# Patient Record
Sex: Female | Born: 1940 | Race: White | Hispanic: No | Marital: Married | Smoking: Never smoker
Health system: Southern US, Community
[De-identification: ages and names within clinical notes are randomized; demographics above are authoritative.]

## PROBLEM LIST (undated history)

## (undated) DIAGNOSIS — J302 Other seasonal allergic rhinitis: Secondary | ICD-10-CM

## (undated) DIAGNOSIS — T7840XA Allergy, unspecified, initial encounter: Secondary | ICD-10-CM

## (undated) DIAGNOSIS — I639 Cerebral infarction, unspecified: Secondary | ICD-10-CM

## (undated) DIAGNOSIS — H269 Unspecified cataract: Secondary | ICD-10-CM

## (undated) DIAGNOSIS — E785 Hyperlipidemia, unspecified: Secondary | ICD-10-CM

## (undated) DIAGNOSIS — M2550 Pain in unspecified joint: Secondary | ICD-10-CM

## (undated) DIAGNOSIS — H409 Unspecified glaucoma: Secondary | ICD-10-CM

## (undated) DIAGNOSIS — Z9289 Personal history of other medical treatment: Secondary | ICD-10-CM

## (undated) DIAGNOSIS — R011 Cardiac murmur, unspecified: Secondary | ICD-10-CM

## (undated) DIAGNOSIS — M1990 Unspecified osteoarthritis, unspecified site: Secondary | ICD-10-CM

## (undated) DIAGNOSIS — C50919 Malignant neoplasm of unspecified site of unspecified female breast: Secondary | ICD-10-CM

## (undated) DIAGNOSIS — M810 Age-related osteoporosis without current pathological fracture: Secondary | ICD-10-CM

## (undated) DIAGNOSIS — K219 Gastro-esophageal reflux disease without esophagitis: Secondary | ICD-10-CM

## (undated) DIAGNOSIS — H348192 Central retinal vein occlusion, unspecified eye, stable: Secondary | ICD-10-CM

## (undated) DIAGNOSIS — I10 Essential (primary) hypertension: Secondary | ICD-10-CM

## (undated) HISTORY — DX: Gastro-esophageal reflux disease without esophagitis: K21.9

## (undated) HISTORY — DX: Unspecified osteoarthritis, unspecified site: M19.90

## (undated) HISTORY — DX: Unspecified cataract: H26.9

## (undated) HISTORY — PX: ARTHROSCOPY KNEE W/ DRILLING: SUR92

## (undated) HISTORY — DX: Age-related osteoporosis without current pathological fracture: M81.0

## (undated) HISTORY — DX: Malignant neoplasm of unspecified site of unspecified female breast: C50.919

## (undated) HISTORY — DX: Hyperlipidemia, unspecified: E78.5

## (undated) HISTORY — DX: Personal history of other medical treatment: Z92.89

## (undated) HISTORY — DX: Pain in unspecified joint: M25.50

## (undated) HISTORY — DX: Unspecified glaucoma: H40.9

## (undated) HISTORY — PX: EXCISION / BIOPSY BREAST / NIPPLE / DUCT: SUR469

## (undated) HISTORY — DX: Allergy, unspecified, initial encounter: T78.40XA

## (undated) HISTORY — DX: Essential (primary) hypertension: I10

## (undated) HISTORY — DX: Central retinal vein occlusion, unspecified eye, stable: H34.8192

## (undated) HISTORY — DX: Cardiac murmur, unspecified: R01.1

## (undated) HISTORY — DX: Cerebral infarction, unspecified: I63.9

---

## 1981-11-17 HISTORY — PX: TUBAL LIGATION: SHX77

## 1989-11-17 HISTORY — PX: ABDOMINAL HYSTERECTOMY: SHX81

## 2001-11-17 HISTORY — PX: BREAST LUMPECTOMY: SHX2

## 2005-05-21 ENCOUNTER — Other Ambulatory Visit: Admission: RE | Admit: 2005-05-21 | Discharge: 2005-05-21 | Payer: Self-pay | Admitting: Gynecology

## 2005-05-23 ENCOUNTER — Encounter: Admission: RE | Admit: 2005-05-23 | Discharge: 2005-05-23 | Payer: Self-pay | Admitting: Gynecology

## 2005-12-19 ENCOUNTER — Ambulatory Visit: Payer: Self-pay | Admitting: Internal Medicine

## 2005-12-24 ENCOUNTER — Ambulatory Visit: Payer: Self-pay | Admitting: Internal Medicine

## 2006-01-22 ENCOUNTER — Ambulatory Visit: Payer: Self-pay | Admitting: Internal Medicine

## 2006-03-30 ENCOUNTER — Ambulatory Visit: Payer: Self-pay | Admitting: Internal Medicine

## 2006-05-25 ENCOUNTER — Encounter: Admission: RE | Admit: 2006-05-25 | Discharge: 2006-05-25 | Payer: Self-pay | Admitting: Internal Medicine

## 2006-06-01 ENCOUNTER — Other Ambulatory Visit: Admission: RE | Admit: 2006-06-01 | Discharge: 2006-06-01 | Payer: Self-pay | Admitting: Gynecology

## 2006-07-31 ENCOUNTER — Ambulatory Visit: Payer: Self-pay | Admitting: Gastroenterology

## 2006-08-06 ENCOUNTER — Ambulatory Visit: Payer: Self-pay | Admitting: Gastroenterology

## 2006-08-19 ENCOUNTER — Ambulatory Visit: Payer: Self-pay | Admitting: Gastroenterology

## 2006-09-17 ENCOUNTER — Ambulatory Visit: Payer: Self-pay | Admitting: Internal Medicine

## 2006-09-22 ENCOUNTER — Encounter: Admission: RE | Admit: 2006-09-22 | Discharge: 2006-09-22 | Payer: Self-pay | Admitting: Internal Medicine

## 2007-01-25 ENCOUNTER — Ambulatory Visit: Payer: Self-pay | Admitting: Internal Medicine

## 2007-01-25 LAB — CONVERTED CEMR LAB
ALT: 25 units/L (ref 0–40)
AST: 22 units/L (ref 0–37)
Albumin: 3.9 g/dL (ref 3.5–5.2)
Alkaline Phosphatase: 85 units/L (ref 39–117)
BUN: 13 mg/dL (ref 6–23)
Basophils Absolute: 0 10*3/uL (ref 0.0–0.1)
Basophils Relative: 0.2 % (ref 0.0–1.0)
Bilirubin Urine: NEGATIVE
Bilirubin, Direct: 0.1 mg/dL (ref 0.0–0.3)
CO2: 34 meq/L — ABNORMAL HIGH (ref 19–32)
Calcium: 9.9 mg/dL (ref 8.4–10.5)
Chloride: 103 meq/L (ref 96–112)
Cholesterol: 140 mg/dL (ref 0–200)
Creatinine, Ser: 0.6 mg/dL (ref 0.4–1.2)
Eosinophils Absolute: 0.1 10*3/uL (ref 0.0–0.6)
Eosinophils Relative: 1.8 % (ref 0.0–5.0)
GFR calc Af Amer: 129 mL/min
GFR calc non Af Amer: 106 mL/min
Glucose, Bld: 99 mg/dL (ref 70–99)
HCT: 41 % (ref 36.0–46.0)
HDL: 37.7 mg/dL — ABNORMAL LOW (ref >39.0)
Hemoglobin, Urine: NEGATIVE
Hemoglobin: 14.3 g/dL (ref 12.0–15.0)
Ketones, ur: NEGATIVE mg/dL
LDL Cholesterol: 66 mg/dL (ref 0–99)
Leukocytes, UA: NEGATIVE
Lymphocytes Relative: 27.5 % (ref 12.0–46.0)
MCHC: 34.9 g/dL (ref 30.0–36.0)
MCV: 92.4 fL (ref 78.0–100.0)
Monocytes Absolute: 0.4 10*3/uL (ref 0.2–0.7)
Monocytes Relative: 5.3 % (ref 3.0–11.0)
Neutro Abs: 5.4 10*3/uL (ref 1.4–7.7)
Neutrophils Relative %: 65.2 % (ref 43.0–77.0)
Nitrite: NEGATIVE
Platelets: 296 10*3/uL (ref 150–400)
Potassium: 4.3 meq/L (ref 3.5–5.1)
RBC: 4.43 M/uL (ref 3.87–5.11)
RDW: 11.6 % (ref 11.5–14.6)
Sodium: 144 meq/L (ref 135–145)
Specific Gravity, Urine: 1.02 (ref 1.000–1.03)
TSH: 2.24 microintl units/mL (ref 0.35–5.50)
Total Bilirubin: 0.8 mg/dL (ref 0.3–1.2)
Total CHOL/HDL Ratio: 3.7
Total Protein, Urine: NEGATIVE mg/dL
Total Protein: 7 g/dL (ref 6.0–8.3)
Triglycerides: 181 mg/dL — ABNORMAL HIGH (ref 0–149)
Urine Glucose: NEGATIVE mg/dL
Urobilinogen, UA: 0.2 (ref 0.0–1.0)
VLDL: 36 mg/dL (ref 0–40)
WBC: 8.1 10*3/uL (ref 4.5–10.5)
pH: 7 (ref 5.0–8.0)

## 2007-03-09 ENCOUNTER — Ambulatory Visit: Payer: Self-pay | Admitting: Vascular Surgery

## 2007-03-09 ENCOUNTER — Encounter: Payer: Self-pay | Admitting: Vascular Surgery

## 2007-03-09 ENCOUNTER — Ambulatory Visit (HOSPITAL_COMMUNITY): Admission: RE | Admit: 2007-03-09 | Discharge: 2007-03-09 | Payer: Self-pay | Admitting: Orthopedic Surgery

## 2007-05-28 ENCOUNTER — Encounter: Admission: RE | Admit: 2007-05-28 | Discharge: 2007-05-28 | Payer: Self-pay | Admitting: Family Medicine

## 2007-07-07 ENCOUNTER — Encounter: Admission: RE | Admit: 2007-07-07 | Discharge: 2007-07-07 | Payer: Self-pay | Admitting: Family Medicine

## 2007-07-10 ENCOUNTER — Encounter: Payer: Self-pay | Admitting: *Deleted

## 2007-07-10 DIAGNOSIS — Z9889 Other specified postprocedural states: Secondary | ICD-10-CM | POA: Insufficient documentation

## 2007-07-10 DIAGNOSIS — Z8679 Personal history of other diseases of the circulatory system: Secondary | ICD-10-CM | POA: Insufficient documentation

## 2007-07-10 DIAGNOSIS — E785 Hyperlipidemia, unspecified: Secondary | ICD-10-CM | POA: Insufficient documentation

## 2007-07-10 DIAGNOSIS — Z853 Personal history of malignant neoplasm of breast: Secondary | ICD-10-CM | POA: Insufficient documentation

## 2007-09-14 ENCOUNTER — Emergency Department (HOSPITAL_COMMUNITY): Admission: EM | Admit: 2007-09-14 | Discharge: 2007-09-14 | Payer: Self-pay | Admitting: Emergency Medicine

## 2008-07-04 ENCOUNTER — Encounter: Admission: RE | Admit: 2008-07-04 | Discharge: 2008-07-04 | Payer: Self-pay | Admitting: Family Medicine

## 2009-07-05 ENCOUNTER — Encounter: Admission: RE | Admit: 2009-07-05 | Discharge: 2009-07-05 | Payer: Self-pay | Admitting: Family Medicine

## 2009-07-09 ENCOUNTER — Encounter: Admission: RE | Admit: 2009-07-09 | Discharge: 2009-07-09 | Payer: Self-pay | Admitting: Family Medicine

## 2009-09-04 ENCOUNTER — Encounter: Admission: RE | Admit: 2009-09-04 | Discharge: 2009-09-04 | Payer: Self-pay | Admitting: Family Medicine

## 2009-09-27 DIAGNOSIS — Z9289 Personal history of other medical treatment: Secondary | ICD-10-CM

## 2009-09-27 HISTORY — DX: Personal history of other medical treatment: Z92.89

## 2010-07-11 ENCOUNTER — Encounter: Admission: RE | Admit: 2010-07-11 | Discharge: 2010-07-11 | Payer: Self-pay | Admitting: Family Medicine

## 2011-06-16 ENCOUNTER — Other Ambulatory Visit: Payer: Self-pay | Admitting: Family Medicine

## 2011-06-16 DIAGNOSIS — N6320 Unspecified lump in the left breast, unspecified quadrant: Secondary | ICD-10-CM

## 2011-06-23 ENCOUNTER — Other Ambulatory Visit: Payer: Self-pay

## 2011-06-24 ENCOUNTER — Ambulatory Visit
Admission: RE | Admit: 2011-06-24 | Discharge: 2011-06-24 | Disposition: A | Discharge status: Home / Self Care | Payer: Medicare Other | Source: Ambulatory Visit | Attending: Family Medicine | Admitting: Family Medicine

## 2011-06-24 ENCOUNTER — Other Ambulatory Visit: Payer: Self-pay | Admitting: Family Medicine

## 2011-06-24 DIAGNOSIS — N6320 Unspecified lump in the left breast, unspecified quadrant: Secondary | ICD-10-CM

## 2011-06-24 DIAGNOSIS — Z1231 Encounter for screening mammogram for malignant neoplasm of breast: Secondary | ICD-10-CM

## 2011-07-25 ENCOUNTER — Ambulatory Visit
Admission: RE | Admit: 2011-07-25 | Discharge: 2011-07-25 | Disposition: A | Discharge status: Home / Self Care | Payer: Medicare Other | Source: Ambulatory Visit | Attending: Family Medicine | Admitting: Family Medicine

## 2011-07-25 DIAGNOSIS — Z1231 Encounter for screening mammogram for malignant neoplasm of breast: Secondary | ICD-10-CM

## 2012-03-25 ENCOUNTER — Other Ambulatory Visit: Payer: Self-pay | Admitting: Family Medicine

## 2012-03-25 DIAGNOSIS — Z780 Asymptomatic menopausal state: Secondary | ICD-10-CM

## 2012-04-02 ENCOUNTER — Ambulatory Visit
Admission: RE | Admit: 2012-04-02 | Discharge: 2012-04-02 | Disposition: A | Discharge status: Home / Self Care | Payer: Medicare Other | Source: Ambulatory Visit | Attending: Family Medicine | Admitting: Family Medicine

## 2012-04-02 DIAGNOSIS — Z780 Asymptomatic menopausal state: Secondary | ICD-10-CM

## 2012-07-26 ENCOUNTER — Other Ambulatory Visit: Payer: Self-pay | Admitting: Family Medicine

## 2012-07-26 DIAGNOSIS — Z1231 Encounter for screening mammogram for malignant neoplasm of breast: Secondary | ICD-10-CM

## 2012-08-12 ENCOUNTER — Ambulatory Visit: Payer: Medicare Other

## 2012-08-23 ENCOUNTER — Ambulatory Visit
Admission: RE | Admit: 2012-08-23 | Discharge: 2012-08-23 | Disposition: A | Discharge status: Home / Self Care | Payer: Medicare Other | Source: Ambulatory Visit | Attending: Family Medicine | Admitting: Family Medicine

## 2012-08-23 DIAGNOSIS — Z1231 Encounter for screening mammogram for malignant neoplasm of breast: Secondary | ICD-10-CM

## 2012-08-25 ENCOUNTER — Other Ambulatory Visit: Payer: Self-pay | Admitting: Family Medicine

## 2012-08-25 DIAGNOSIS — R928 Other abnormal and inconclusive findings on diagnostic imaging of breast: Secondary | ICD-10-CM

## 2012-08-31 ENCOUNTER — Ambulatory Visit
Admission: RE | Admit: 2012-08-31 | Discharge: 2012-08-31 | Disposition: A | Discharge status: Home / Self Care | Payer: Medicare Other | Source: Ambulatory Visit | Attending: Family Medicine | Admitting: Family Medicine

## 2012-08-31 ENCOUNTER — Other Ambulatory Visit: Payer: Self-pay | Admitting: Family Medicine

## 2012-08-31 DIAGNOSIS — R928 Other abnormal and inconclusive findings on diagnostic imaging of breast: Secondary | ICD-10-CM

## 2012-09-01 ENCOUNTER — Other Ambulatory Visit: Payer: Self-pay | Admitting: Family Medicine

## 2012-09-01 DIAGNOSIS — C50911 Malignant neoplasm of unspecified site of right female breast: Secondary | ICD-10-CM

## 2012-09-03 ENCOUNTER — Other Ambulatory Visit: Payer: Self-pay | Admitting: Oncology

## 2012-09-03 ENCOUNTER — Encounter: Payer: Self-pay | Admitting: Oncology

## 2012-09-03 DIAGNOSIS — C50411 Malignant neoplasm of upper-outer quadrant of right female breast: Secondary | ICD-10-CM | POA: Insufficient documentation

## 2012-09-03 DIAGNOSIS — C50419 Malignant neoplasm of upper-outer quadrant of unspecified female breast: Secondary | ICD-10-CM

## 2012-09-06 ENCOUNTER — Ambulatory Visit
Admission: RE | Admit: 2012-09-06 | Discharge: 2012-09-06 | Disposition: A | Discharge status: Home / Self Care | Payer: Medicare Other | Source: Ambulatory Visit | Attending: Family Medicine | Admitting: Family Medicine

## 2012-09-06 DIAGNOSIS — C50911 Malignant neoplasm of unspecified site of right female breast: Secondary | ICD-10-CM

## 2012-09-06 MED ORDER — GADOBENATE DIMEGLUMINE 529 MG/ML IV SOLN
12.0000 mL | Freq: Once | INTRAVENOUS | Status: AC | PRN
  Administered 2012-09-06: 12 mL via INTRAVENOUS

## 2012-09-08 ENCOUNTER — Ambulatory Visit (HOSPITAL_BASED_OUTPATIENT_CLINIC_OR_DEPARTMENT_OTHER): Payer: Medicare Other | Admitting: Lab

## 2012-09-08 ENCOUNTER — Encounter: Payer: Self-pay | Admitting: *Deleted

## 2012-09-08 ENCOUNTER — Ambulatory Visit (HOSPITAL_BASED_OUTPATIENT_CLINIC_OR_DEPARTMENT_OTHER): Payer: Medicare Other | Admitting: General Surgery

## 2012-09-08 ENCOUNTER — Ambulatory Visit: Payer: Medicare Other | Attending: General Surgery | Admitting: Physical Therapy

## 2012-09-08 ENCOUNTER — Telehealth: Payer: Self-pay | Admitting: *Deleted

## 2012-09-08 ENCOUNTER — Encounter: Payer: Self-pay | Admitting: Oncology

## 2012-09-08 ENCOUNTER — Ambulatory Visit: Payer: Medicare Other

## 2012-09-08 ENCOUNTER — Ambulatory Visit (HOSPITAL_BASED_OUTPATIENT_CLINIC_OR_DEPARTMENT_OTHER): Payer: Medicare Other | Admitting: Oncology

## 2012-09-08 ENCOUNTER — Ambulatory Visit
Admission: RE | Admit: 2012-09-08 | Discharge: 2012-09-08 | Disposition: A | Discharge status: Home / Self Care | Payer: Medicare Other | Source: Ambulatory Visit | Attending: Radiation Oncology | Admitting: Radiation Oncology

## 2012-09-08 ENCOUNTER — Other Ambulatory Visit: Payer: Self-pay | Admitting: *Deleted

## 2012-09-08 ENCOUNTER — Ambulatory Visit: Payer: Self-pay | Admitting: Radiation Oncology

## 2012-09-08 ENCOUNTER — Encounter (INDEPENDENT_AMBULATORY_CARE_PROVIDER_SITE_OTHER): Payer: Self-pay | Admitting: General Surgery

## 2012-09-08 VITALS — BP 152/79 | HR 89 | Temp 97.8°F | Resp 20 | Ht 64.0 in | Wt 141.0 lb

## 2012-09-08 DIAGNOSIS — C50911 Malignant neoplasm of unspecified site of right female breast: Secondary | ICD-10-CM

## 2012-09-08 DIAGNOSIS — C50419 Malignant neoplasm of upper-outer quadrant of unspecified female breast: Secondary | ICD-10-CM

## 2012-09-08 DIAGNOSIS — C773 Secondary and unspecified malignant neoplasm of axilla and upper limb lymph nodes: Secondary | ICD-10-CM

## 2012-09-08 DIAGNOSIS — R293 Abnormal posture: Secondary | ICD-10-CM | POA: Insufficient documentation

## 2012-09-08 DIAGNOSIS — Z170 Estrogen receptor positive status [ER+]: Secondary | ICD-10-CM

## 2012-09-08 DIAGNOSIS — IMO0001 Reserved for inherently not codable concepts without codable children: Secondary | ICD-10-CM | POA: Insufficient documentation

## 2012-09-08 DIAGNOSIS — C50919 Malignant neoplasm of unspecified site of unspecified female breast: Secondary | ICD-10-CM

## 2012-09-08 DIAGNOSIS — M25619 Stiffness of unspecified shoulder, not elsewhere classified: Secondary | ICD-10-CM | POA: Insufficient documentation

## 2012-09-08 LAB — COMPREHENSIVE METABOLIC PANEL (CC13)
ALT: 22 U/L (ref 0–55)
AST: 18 U/L (ref 5–34)
Albumin: 3.8 g/dL (ref 3.5–5.0)
Alkaline Phosphatase: 79 U/L (ref 40–150)
BUN: 13 mg/dL (ref 7.0–26.0)
CO2: 25 mEq/L (ref 22–29)
Calcium: 9.7 mg/dL (ref 8.4–10.4)
Chloride: 106 mEq/L (ref 98–107)
Creatinine: 0.7 mg/dL (ref 0.6–1.1)
Glucose: 95 mg/dl (ref 70–99)
Potassium: 3.8 mEq/L (ref 3.5–5.1)
Sodium: 144 mEq/L (ref 136–145)
Total Bilirubin: 0.6 mg/dL (ref 0.20–1.20)
Total Protein: 6.4 g/dL (ref 6.4–8.3)

## 2012-09-08 LAB — CBC WITH DIFFERENTIAL/PLATELET
BASO%: 0.6 % (ref 0.0–2.0)
Basophils Absolute: 0 10*3/uL (ref 0.0–0.1)
EOS%: 1.4 % (ref 0.0–7.0)
Eosinophils Absolute: 0.1 10*3/uL (ref 0.0–0.5)
HCT: 38.6 % (ref 34.8–46.6)
HGB: 13.5 g/dL (ref 11.6–15.9)
LYMPH%: 26.1 % (ref 14.0–49.7)
MCH: 32.7 pg (ref 25.1–34.0)
MCHC: 35 g/dL (ref 31.5–36.0)
MCV: 93.4 fL (ref 79.5–101.0)
MONO#: 0.4 10*3/uL (ref 0.1–0.9)
MONO%: 6.1 % (ref 0.0–14.0)
NEUT#: 4.7 10*3/uL (ref 1.5–6.5)
NEUT%: 65.8 % (ref 38.4–76.8)
Platelets: 210 10*3/uL (ref 145–400)
RBC: 4.13 10*6/uL (ref 3.70–5.45)
RDW: 12.2 % (ref 11.2–14.5)
WBC: 7.2 10*3/uL (ref 3.9–10.3)
lymph#: 1.9 10*3/uL (ref 0.9–3.3)

## 2012-09-08 LAB — CANCER ANTIGEN 27.29: CA 27.29: 39 U/mL (ref 0–39)

## 2012-09-08 NOTE — Progress Notes (Signed)
Checked in new pt with no financial concerns at this time. ° °

## 2012-09-08 NOTE — Progress Notes (Signed)
Patient ID: Mary Young, female   DOB: November 27, 1940, 71 y.o.   MRN: [id number]  No chief complaint on file.   HPI Mary Young is a 71 y.o. female. At this patient is seen in our multidisciplinary breast clinic for evaluation of a newly diagnosed right breast cancer. This was found on annual screening mammogram. It was located at the 10:00 position and measured 5.2 cm in greatest dimension. It is ER positive PR positive and HER-2/neu negative. She does have a history of left breast lumpectomy with sentinel lymph node biopsy which was followed by radiation therapy.  She denies any systemic symptoms. She also had an enlarged and suspicious lymph nodes and biopsy of this was performed which was consistent with metastatic carcinoma. She says that she does do her self breast exams and has not noticed any changes. HPI  Past Medical History  Diagnosis Date  . Breast cancer   . Hypertension   . Glaucoma   . Central retinal vein occlusion   . Asthma   . Joint pain     Past Surgical History  Procedure Date  . Abdominal hysterectomy   . Breast lumpectomy 2003  . Arthroscopy knee w/ drilling 2005/2008    Left knee/Right knee  . Tubal ligation 1983    No family history on file.  Social History History  Substance Use Topics  . Smoking status: Never Smoker   . Smokeless tobacco: Not on file  . Alcohol Use: Yes    Allergies  Allergen Reactions  . Timoptic (Timolol Maleate) Itching    redness    Current Outpatient Prescriptions  Medication Sig Dispense Refill  . aspirin 81 MG tablet Take 81 mg by mouth daily.      . B Complex-C (SUPER B COMPLEX PO) Take by mouth daily.      . brimonidine (ALPHAGAN) 0.15 % ophthalmic solution       . Calcium-Vitamin D-Vitamin K 500-100-40 MG-UNT-MCG CHEW Chew by mouth daily. 2 po daily      . dorzolamide (TRUSOPT) 2 % ophthalmic solution       . lisinopril (PRINIVIL,ZESTRIL) 10 MG tablet       . Multiple Vitamin (MULTIVITAMIN) tablet Take 1  tablet by mouth daily.      . simvastatin (ZOCOR) 20 MG tablet       . TRAVATAN Z 0.004 % SOLN ophthalmic solution       . vitamin E 400 UNIT capsule Take 400 Units by mouth daily.        Review of Systems Review of Systems All other review of systems negative or noncontributory except as stated in the HPI  There were no vitals taken for this visit. Wt Readings from Last 3 Encounters:  09/08/12 141 lb (63.957 kg)  01/25/07 147 lb (66.679 kg)   Temp Readings from Last 3 Encounters:  09/08/12 97.8 F (36.6 C)    BP Readings from Last 3 Encounters:  09/08/12 152/79  01/25/07 145/79   Pulse Readings from Last 3 Encounters:  09/08/12 89    Physical Exam Physical Exam Physical Exam  Nursing note and vitals reviewed. Constitutional: She is oriented to person, place, and time. She appears well-developed and well-nourished. No distress.  HENT:  Head: Normocephalic and atraumatic.  Mouth/Throat: No oropharyngeal exudate.  Eyes: Conjunctivae and EOM are normal. Pupils are equal, round, and reactive to light. Right eye exhibits no discharge. Left eye exhibits no discharge. No scleral icterus.  Neck: Normal range of motion. Neck supple.  No tracheal deviation present.  Cardiovascular: Normal rate, regular rhythm, normal heart sounds and intact distal pulses.   Pulmonary/Chest: Effort normal and breath sounds normal. No stridor. No respiratory distress. She has no wheezes.  Abdominal: Soft. Bowel sounds are normal. She exhibits no distension and no mass. There is no tenderness. There is no rebound and no guarding.  Musculoskeletal: Normal range of motion. She exhibits no edema and no tenderness.  Neurological: She is alert and oriented to person, place, and time.  Skin: Skin is warm and dry. No rash noted. She is not diaphoretic. No erythema. No pallor.  Psychiatric: She has a normal mood and affect. Her behavior is normal. Judgment and thought content normal.  Breast:  Her left breast  has a prior lumpectomy scar on the inner inferior aspect of the breast as well as a sentinel lymph node scar as well. She does have some dimpling in the area of her lumpectomy but no evidence of recurrent tumor. Otherwise no lymphadenopathy or suspicious masses. A right breast has an area of of suspicious mass at the 10:00 to 11:00 position of the breast. I can appreciate a 6 cm area of masslike lesion concerning for malignancy. I do not appreciate any lymphadenopathy on exam despite her known positive lymph node  Data Reviewed Pathology, mammogram, ultrasound, MRI  Assessment    Right breast cancer with positive lymph node involvement She has a history of left breast cancer there is no evidence of recurrent tumor on the left. She does have a known right breast cancer with lymph node involvement and this does feel like a fairly large area of involvement at the 10:00 to 11:00 position of the right breast. We discussed with her the options of modified radical mastectomy versus neoadjuvant chemotherapy followed by breast conservation therapy and axillary dissection and we discussed the pros and cons and risks and benefits of each. She is interested in neoadjuvant chemotherapy and breast conservation after much discussion as well as with the oncologist. She is going to have an Oncotype performed on her biopsy specimens and will be entered in a clinical trial for neoadjuvant hormonal or chemotherapy followed by breast conservation if she has a good response to her neoadjuvant therapy. As it stands right now, without any neoadjuvant therapy, I do not think that she would really be a good candidate for breast conservation therapy given her clinical exam and imaging studies. We will be happy to place a Mediport if desired for chemotherapy. Otherwise we will await new adjuvant therapy and plan for surgery down the road    Plan    Neoadjuvant chemotherapy or hormonal therapy followed by possible right partial  mastectomy and completion  Axillary dissection depending on her response to her neoadjuvant therapy       Avion Kutzer DAVID 09/08/2012, 11:15 AM

## 2012-09-08 NOTE — Telephone Encounter (Signed)
[facility] PATIENT APPOINTMENT FOR PET SCAN ON 09-14-2012

## 2012-09-08 NOTE — Progress Notes (Signed)
Radiation Oncology         [phone]) [phone] ________________________________  Initial outpatient Consultation  Name: Mary Young MRN: [id number]  Date: 09/08/2012  DOB: Dec 12, 1940  REFERRING PHYSICIAN: Lodema Pilot, DO  DIAGNOSIS: T2N1 right breast cancer   HISTORY OF PRESENT ILLNESS::Mary Young is a 71 y.o. female  who underwent a screening Magram. This showed a right breast mass with associated calcifications. She has a prior history of a left lumpectomy and radiation for DCIS treated in 2003. This was apparently estrogen and progesterone receptor negative so she did not receive adjuvant tamoxifen. Per her report she felt this multiple times and ended even been felt by her primary care physician and she believed it was a muscle. She did not palpate any enlarged lymph nodes. Large lymph nodes were seen on her mammogram. On MRI the primary measured 4.7 x 3.6 x 5.2 cm. The lymph node measured 1.5 x 1.0 x 2.0 cm. biopsy of the primary showed an invasive mammary carcinoma which was ER/PR positive and HER-2 negative. Ki-67 was 66%. The lymph node was also biopsied and was consistent with the primary lesion phenotype. She reports feeling this mass and thinking it was a piece of her muscle. She reports this mass has become more prominent since May. He denies any headaches or new bone pain. She is interested in breast conservation.  PREVIOUS RADIATION THERAPY: Yes radiation to the left breast completed May of 2003  PAST MEDICAL HISTORY:  has a past medical history of Breast cancer; Hypertension; Glaucoma; Central retinal vein occlusion; Asthma; and Joint pain.    PAST SURGICAL HISTORY: Past Surgical History  Procedure Date  . Abdominal hysterectomy   . Breast lumpectomy 2003  . Arthroscopy knee w/ drilling 2005/2008    Left knee/Right knee  . Tubal ligation 1983    FAMILY HISTORY: family history is not on file.  SOCIAL HISTORY:  reports that she has never smoked. She does not have  any smokeless tobacco history on file. She reports that she drinks alcohol. She reports that she does not use illicit drugs.  ALLERGIES: Timoptic  MEDICATIONS:  Current Outpatient Prescriptions  Medication Sig Dispense Refill  . aspirin 81 MG tablet Take 81 mg by mouth daily.      . B Complex-C (SUPER B COMPLEX PO) Take by mouth daily.      . brimonidine (ALPHAGAN) 0.15 % ophthalmic solution       . Calcium-Vitamin D-Vitamin K 500-100-40 MG-UNT-MCG CHEW Chew by mouth daily. 2 po daily      . dorzolamide (TRUSOPT) 2 % ophthalmic solution       . lisinopril (PRINIVIL,ZESTRIL) 10 MG tablet       . Multiple Vitamin (MULTIVITAMIN) tablet Take 1 tablet by mouth daily.      . simvastatin (ZOCOR) 20 MG tablet       . TRAVATAN Z 0.004 % SOLN ophthalmic solution       . vitamin E 400 UNIT capsule Take 400 Units by mouth daily.        REVIEW OF SYSTEMS:  A 15 point review of systems is documented in the electronic medical record. This was obtained by the nursing staff. However, I reviewed this with the patient to discuss relevant findings and make appropriate changes.  Pertinent items are noted in HPI.   PHYSICAL EXAM:  vitals were not taken for this visit. she is a pleasant female who appears younger than her stated age. She has no palpable cervical supraclavicular or  axillary adenopathy on the left. On the right there is a palpable lymph node in the right axilla. There is a mass palpable in the upper medial to inner quadrant. This is mobile. No palpable abnormalities of the left breast. She has changes consistent with a prior left lumpectomy in the inframammary fold of her left breast.  LABORATORY DATA:  Lab Results  Component Value Date   WBC 7.2 09/08/2012   HGB 13.5 09/08/2012   HCT 38.6 09/08/2012   MCV 93.4 09/08/2012   PLT 210 09/08/2012   Lab Results  Component Value Date   NA 144 09/08/2012   K 3.8 09/08/2012   CL 106 09/08/2012   CO2 25 09/08/2012   Lab Results  Component  Value Date   ALT 22 09/08/2012   AST 18 09/08/2012   ALKPHOS 79 09/08/2012   BILITOT 0.60 09/08/2012     RADIOGRAPHY: US Breast Right  08/31/2012  *RADIOLOGY REPORT*  Clinical Data:  The patient returns for evaluation of possible mass with microcalcifications in the right upper outer quadrant noted on recent screening mammogram with tomosynthesis dated 08/23/2012.  DIGITAL DIAGNOSTIC RIGHT LIMITED MAMMOGRAM  AND RIGHT BREAST ULTRASOUND:  Comparison:  07/25/2011, 07/11/2010, 07/05/2009  Findings:  Magnification views demonstrate pleomorphic microcalcifications within an area of increased density in the right upper outer quadrant.  The calcifications cover an area of approximately 5.3 x 4.5 x 4.3 cm mammographically.  A lymph node in the right axilla has increased in size since prior studies.  On physical exam, I palpate a 4 cm mass in the right upper outer quadrant at 10 o'clock approximately 4 cm from the nipple.  Ultrasound is performed, showing an ill-defined hypoechoic mass with posterior shadowing at 10 o'clock 4 cm from the right nipple. This is difficult to measure accurately but is at least 2.7 cm in size.  There is an abnormal lymph node in the right axilla with an eccentrically thickened cortex.  The appearance is highly suspicious for invasive mammary carcinoma with possible axillary metastasis.  Biopsy is recommended.  Ultrasound-guided core needle biopsy and surgical excisional biopsy were discussed with the patient.  I suggested ultrasound-guided core needle biopsy and the patient agreed with this plan.  IMPRESSION: Suspicious mass and microcalcifications in the right upper outer quadrant with abnormal right axillary lymph node.  The appearance is highly suspicious for invasive mammary carcinoma with possible axillary metastasis.  RECOMMENDATION: Ultrasound-guided core needle biopsy of the right upper outer quadrant mass in the right axillary lymph node.  This will be performed and reported  separately.  Report was telephoned to [facility] at Dr. Chauncy Passy office.  BI-RADS CATEGORY 5:  Highly suggestive of malignancy - appropriate action should be taken.   Original Report Authenticated By: Daryl Eastern, M.D.    Mr Breast Bilateral W Wo Contrast  09/06/2012  *RADIOLOGY REPORT*  Clinical Data: New diagnosis right-sided breast cancer with right axillary metastasis.  BILATERAL BREAST MRI WITH AND WITHOUT CONTRAST  Technique: Multiplanar, multisequence MR images of both breasts were obtained prior to and following the intravenous administration of 12ml of Multihance.  Three dimensional images were evaluated at the independent DynaCad workstation.  Comparison:  None.  Findings: Mild parenchymal enhancement seen bilaterally.  An ill- defined heterogeneously enhancing mass is seen in the upper outer quadrant of the right breast extending from anterior to posterior aspect measuring 4.7 (AP) x 3.6 (trv) x 5.2 (cc) cm with associated biopsy clip artifact centered in the mass.  No additional areas  of enhancement are seen in either breast.  An abnormal lymph node with an attenuated central fatty hilum is seen in the right axilla measuring 1.5 x 1.0 x 2.0 cm corresponding to the known metastatic disease.  Two other level 1 lymph nodes  are seen in the right axilla with an attenuated fatty hila also concerning for metastatic disease.  No internal mammary or left axillary adenopathy is seen.  A focus of high T2 signal is seen in the right hepatic lobe measuring 0.4 cm likely a benign cyst or hemangioma.  IMPRESSION: Known malignancy, right breast and right axillary metastatic disease.  No MRI specific evidence of malignancy, left breast.  RECOMMENDATION: Treatment plan, right breast.  THREE-DIMENSIONAL MR IMAGE RENDERING ON INDEPENDENT WORKSTATION:  Three-dimensional MR images were rendered by post-processing of the original MR data on an independent workstation.  The three- dimensional MR images were  interpreted, and findings were reported in the accompanying complete MRI report for this study.  BI-RADS CATEGORY 6:  Known biopsy-proven malignancy - appropriate action should be taken.   Original Report Authenticated By: Hiram Gash, M.D.    [place] Core Biopsy  09/01/2012  *RADIOLOGY REPORT*  Clinical Data:  Mass with microcalcifications in the right upper outer quadrant and an abnormal right axillary lymph node.  The patient has a history of left breast cancer with lumpectomy and radiation therapy in 2003 in .  ULTRASOUND GUIDED VACUUM ASSISTED CORE BIOPSY OF THE RIGHT BREAST AND ULTRASOUND-GUIDED CORE BIOPSY OF THE RIGHT AXILLA  The patient and I discussed the procedure of ultrasound-guided biopsy, including benefits and alternatives.  We discussed the high likelihood of a successful procedure. We discussed the risks of the procedure including infection, bleeding, tissue injury, clip migration, and inadequate sampling.  Informed written consent was given.  Using sterile technique, 2% lidocaine, ultrasound guidance, and a 12 gauge vacuum assisted needle, biopsy was performed of the mass in the right upper outer quadrant using a caudocranial approach. At the conclusion of the procedure, a ribbon tissue marker clip was deployed into the biopsy cavity.  Using sterile technique,2% lidocaine, ultrasound guidance, and a 14 gauge automated biopsy device, biopsy was performed of  the abnormal right axillary lymph node.  Follow-up 2-view mammogram was performed and dictated separately.  Histologic evaluation of the right upper outer quadrant mass demonstrates invasive mammary carcinoma.  This is favored to be a grade II/III invasive ductal carcinoma.  Histologic evaluation of the right axillary lymph node demonstrates metastatic carcinoma. These findings are concordant with the imaging findings.  Results were discussed with the patient by telephone at her request.  She reports no complications from the  procedure.  The patient is scheduled to be seen in the [facility] on 09/08/2012.  She is scheduled for breast MRI on 09/06/2012.  IMPRESSION: Ultrasound-guided biopsy of a mass in the right upper outer quadrant and an abnormal right axillary lymph node.  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  The patient is scheduled for the [facility] on 09/08/2012 and for breast MRI on 09/06/2012.  No apparent complications.   Original Report Authenticated By: Daryl Eastern, M.D.    [place] Core Biopsy  09/01/2012  *RADIOLOGY REPORT*  Clinical Data:  Mass with microcalcifications in the right upper outer quadrant and an abnormal right axillary lymph node.  The patient has a history of left breast cancer with lumpectomy and radiation therapy in 2003  in [place].  ULTRASOUND GUIDED VACUUM ASSISTED CORE BIOPSY OF THE RIGHT BREAST AND ULTRASOUND-GUIDED CORE BIOPSY OF THE RIGHT AXILLA  The patient and I discussed the procedure of ultrasound-guided biopsy, including benefits and alternatives.  We discussed the high likelihood of a successful procedure. We discussed the risks of the procedure including infection, bleeding, tissue injury, clip migration, and inadequate sampling.  Informed written consent was given.  Using sterile technique, 2% lidocaine, ultrasound guidance, and a 12 gauge vacuum assisted needle, biopsy was performed of the mass in the right upper outer quadrant using a caudocranial approach. At the conclusion of the procedure, a ribbon tissue marker clip was deployed into the biopsy cavity.  Using sterile technique,2% lidocaine, ultrasound guidance, and a 14 gauge automated biopsy device, biopsy was performed of  the abnormal right axillary lymph node.  Follow-up 2-view mammogram was performed and dictated separately.  Histologic evaluation of the right upper outer quadrant  mass demonstrates invasive mammary carcinoma.  This is favored to be a grade II/III invasive ductal carcinoma.  Histologic evaluation of the right axillary lymph node demonstrates metastatic carcinoma. These findings are concordant with the imaging findings.  Results were discussed with the patient by telephone at her request.  She reports no complications from the procedure.  The patient is scheduled to be seen in the [facility] on 09/08/2012.  She is scheduled for breast MRI on 09/06/2012.  IMPRESSION: Ultrasound-guided biopsy of a mass in the right upper outer quadrant and an abnormal right axillary lymph node.  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  The patient is scheduled for the [facility] on 09/08/2012 and for breast MRI on 09/06/2012.  No apparent complications.   Original Report Authenticated By: Daryl Eastern, M.D.    Mm Digital Diag Ltd R  08/31/2012  *RADIOLOGY REPORT*  Clinical Data:  The patient returns for evaluation of possible mass with microcalcifications in the right upper outer quadrant noted on recent screening mammogram with tomosynthesis dated 08/23/2012.  DIGITAL DIAGNOSTIC RIGHT LIMITED MAMMOGRAM  AND RIGHT BREAST ULTRASOUND:  Comparison:  07/25/2011, 07/11/2010, 07/05/2009  Findings:  Magnification views demonstrate pleomorphic microcalcifications within an area of increased density in the right upper outer quadrant.  The calcifications cover an area of approximately 5.3 x 4.5 x 4.3 cm mammographically.  A lymph node in the right axilla has increased in size since prior studies.  On physical exam, I palpate a 4 cm mass in the right upper outer quadrant at 10 o'clock approximately 4 cm from the nipple.  Ultrasound is performed, showing an ill-defined hypoechoic mass with posterior shadowing at 10 o'clock 4 cm from the right nipple. This is  difficult to measure accurately but is at least 2.7 cm in size.  There is an abnormal lymph node in the right axilla with an eccentrically thickened cortex.  The appearance is highly suspicious for invasive mammary carcinoma with possible axillary metastasis.  Biopsy is recommended.  Ultrasound-guided core needle biopsy and surgical excisional biopsy were discussed with the patient.  I suggested ultrasound-guided core needle biopsy and the patient agreed with this plan.  IMPRESSION: Suspicious mass and microcalcifications in the right upper outer quadrant with abnormal right axillary lymph node.  The appearance is highly suspicious for invasive mammary carcinoma with possible axillary metastasis.  RECOMMENDATION: Ultrasound-guided core needle biopsy of the right upper outer quadrant mass in the right axillary lymph node.  This will be  performed and reported separately.  Report was telephoned to [facility] at Dr. Chauncy Passy office.  BI-RADS CATEGORY 5:  Highly suggestive of malignancy - appropriate action should be taken.   Original Report Authenticated By: Daryl Eastern, M.D.    Mm Digital Diagnostic Unilat R  08/31/2012  *RADIOLOGY REPORT*  Clinical Data:  Ultrasound-guided core needle biopsy of a mass in the right upper outer quadrant with clip placement.  DIGITAL DIAGNOSTIC RIGHT MAMMOGRAM  Comparison:  Previous exams.  Findings:  Films are performed following ultrasound guided biopsy of a mass at 10 o'clock in the right breast.  The ribbon clip is appropriately positioned within the mass.  IMPRESSION: Appropriate clip placement following ultrasound-guided core needle biopsy of a mass at 10 o'clock in the right breast.   Original Report Authenticated By: Daryl Eastern, M.D.    Mm Digital Screening  08/24/2012  *RADIOLOGY REPORT*  Clinical Data: Screening.  DIGITAL BILATERAL SCREENING MAMMOGRAM WITH CAD  DIGITAL BREAST TOMOSYNTHESIS  Digital breast tomosynthesis images are acquired in two projections.   These images are reviewed in combination with the digital mammogram, confirming the findings below.  Comparison:  Previous exams.  Findings: Two views of each breast demonstrate heterogeneously dense tissue.  In the right breast, calcifications warrant further evaluation with magnified views.  In the left breast, no mass or malignant type calcifications are identified.  Images were processed with CAD.  IMPRESSION: Further evaluation is suggested for calcifications in the right breast.  RECOMMENDATION: Diagnostic mammogram of the right breast. (Code:[id number]-R-73M)  BI-RADS CATEGORY 0:  Incomplete.  Need additional imaging evaluation and/or prior mammograms for comparison.   Original Report Authenticated By: Darrol Angel, M.D.       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 Canary Brim 46 has discussed neoadjuvant treatment with antiestrogen therapy with Dr. Donnie Coffin. She is interested in breast conservation. She understands that this could take months or even a year to decrease in size. She understands that upfront she would need a mastectomy at this point. She also 100 understands that she would need radiation regardless due to the positive lymph node. She is familiar with radiation in women over the process of simulation the placement tattoos again. We discussed treatment as an outpatient again whether she has a mastectomy or whether she has a lumpectomy. Her indications for postmastectomy radiation at this time are positive lymph node and the tumor size it looks over 5 cm. We discussed the possible short and long-term side effects of treatment. I will see her back after her definitive surgery. We'll have to take into account her previous left breast treatment and ensure no overlap.  I spent 60 minutes  face to face with the patient and more than 50% of that time was spent in counseling and/or coordination of care.     ------------------------------------------------  Lurline Hare, MD

## 2012-09-08 NOTE — Progress Notes (Signed)
[facility] Psychosocial Distress Screening Clinical Social Work  Patient completed distress screening protocol, and scored a 1 on the Psychosocial Distress Thermometer which indicates mild distress. Clinical Social Worker met with patient and patient's husband in [facility] to assess for distress and other psychosocial needs.  Pt stated she was feeling "good", and had no concerns at this time.  Pt stated she had all of her questions answered by physicians and staff, and felt comfortable with her treatment plan.  CSW informed pt of the patient and family support team and support services available.  CSW encouraged pt to call with any questions or concerns.         Clinical Social Worker follow up needed: not at this time  Tamala Julian, MSW, LCSW Clinical Social Worker [facility] [phone]

## 2012-09-10 ENCOUNTER — Telehealth: Payer: Self-pay | Admitting: *Deleted

## 2012-09-10 NOTE — Telephone Encounter (Signed)
Spoke to pt concerning [facility] from 09/08/12.  Pt denies questions or concerns regarding dx or treatment care plan at this time.  Pt relate she would like to switch surgeons.  She liked Dr. Biagio Quint although she did some research and would like to have a MD and someone that has been performing breast surgeries for a longer period of time.  I called [facility] and arranged for pt to see Dr. Derrell Lolling on 09/16/12 at 10:30.  Gave pt date and time.  Pt denies further needs at this time.  Encourage pt to call with further concerns.  Received verbal understanding.  Contact information given.

## 2012-09-13 NOTE — Progress Notes (Signed)
Mary Young [id number] [birth year] 71 y.o. 09/13/2012 3:26 PM  CC   DEWEY,ELIZABETH, MD [address], [address] [place] [address]  REASON FOR CONSULTATION:    Breast cancer Patient was seen in the Multidisciplinary Breast Clinic for discussion of her treatment options. She was seen by Dr. Pierce Crane, Radiation Onc[facility]  STAGE:   Cancer of upper-outer quadrant of female breast   Primary site: Breast (Right)   Staging method: AJCC 7th Edition   Clinical: Stage IIIA (T3, N1, cM0)   Summary: Stage IIIA (T3, N1, cM0)  REFERRING PHYSICIAN:  As above  HISTORY OF PRESENT ILLNESS:  Mary Young is a 71 y.o. female.  From [place]. She has undergone annual screening mammography. She is a prior history of breast cancer as well in the contralateral breast 2003. A screening mammogram showed calcifications possible mass in the right breast. A followup mammogram performed 08/31/2012 showed a possible mass and right breast. Calcifications were seen over an area measuring 5 x 4 cm. Physical exam did show mass measuring 4 cm and central portion of the breast. Biopsy performed of the right breast and axilla on 08/31/2012 showed invasive ductal cancer, ER/PR ( 100 and 96% respectively ) both strongly positive, HER-2 was negative, proliferative index 66%. Lymph nodes positive as well. MRI scan of both breasts was performed on 09/06/12. The mass measuring 4.7 x 5.2 cm were seen in the right breast as well as a large lymph nodes.   Past Medical History:  Past Medical History  Diagnosis Date  . Breast cancer  2003 , DCIS , status post lumpectomy, radiation no adjuvant hormonal therapy.   . Hypertension   . Glaucoma   . Central retinal vein occlusion  treated at [facility]   . Asthma   . Joint pain     Past Surgical History:  Past Surgical History  Procedure Date  . Abdominal hysterectomy  ovaries left in   . Breast  lumpectomy 2003  . Arthroscopy knee w/ drilling 2005/2008    Left knee/Right knee  . Tubal ligation 1983    Family History: No history of breast ovarian cancer family No family history on file.  Social History  History  Substance Use Topics  . Smoking status: Never Smoker   . Smokeless tobacco: Not on file  . Alcohol Use: Yes, moderate of wine use   She is originally from [place]. She's been married for close to for 50 years. She has 3 children 6 grandchildren. She's retired Environmental health practitioner.  Allergies:  Allergies  Allergen Reactions  . Timoptic (Timolol Maleate) Itching    redness    Current Medications:  Current Outpatient Prescriptions  Medication Sig Dispense Refill  . aspirin 81 MG tablet Take 81 mg by mouth daily.      . B Complex-C (SUPER B COMPLEX PO) Take by mouth daily.      . brimonidine (ALPHAGAN) 0.15 % ophthalmic solution       . Calcium-Vitamin D-Vitamin K 500-100-40 MG-UNT-MCG CHEW Chew by mouth daily. 2 po daily      . dorzolamide (TRUSOPT) 2 % ophthalmic solution       . lisinopril (PRINIVIL,ZESTRIL) 10 MG tablet       . Multiple Vitamin (MULTIVITAMIN) tablet Take 1 tablet by mouth daily.      . simvastatin (ZOCOR) 20 MG tablet       . TRAVATAN Z 0.004 % SOLN ophthalmic solution       .  vitamin E 400 UNIT capsule Take 400 Units by mouth daily.        OB/GYN History:  G3 P3 Menarche age 71 Menopause at time of hysterectomy used HRT for 5 or 6 years     Fertility Discussion: no Prior History of Cancer: no  Health Maintenance:  Colonoscopy yes  Bone Density yes  Last PAP smear yes  ECOG PERFORMANCE STATUS: 0 - Asymptomatic  Genetic Counseling/testing: yes  REVIEW OF SYSTEMS:  A comprehensive review of systems was negative.  PHYSICAL EXAMINATION: Blood pressure 152/79, pulse 89, temperature 97.8 F (36.6 C), resp. rate 20, height 5\' 4"  (1.626 m), weight 141 lb (63.957 kg).  HEENT:  Sclerae anicteric, conjunctivae pink.   Oropharynx clear.  No mucositis or candidiasis.  Nodes:  No cervical, supraclavicular, or axillary lymphadenopathy palpated.  Breast Exam:  Right breast has a large mass measuring about 4 cm., There is ipsilateral adenopathy noted as well..  Left breast is status post lumpectomy and radiation. No masses appreciated. Lungs:  Clear to auscultation bilaterally.  No crackles, rhonchi, or wheezes.  Heart:  Regular rate and rhythm.  Abdomen:  Soft, nontender.  Positive bowel sounds.  No organomegaly or masses palpated.  Musculoskeletal:  No focal spinal tenderness to palpation.  Extremities:  Benign.  No peripheral edema or cyanosis.  Skin:  Benign.  Neuro:  Nonfocal.     STUDIES/RESULTS: US Breast Right  08/31/2012  *RADIOLOGY REPORT*  Clinical Data:  The patient returns for evaluation of possible mass with microcalcifications in the right upper outer quadrant noted on recent screening mammogram with tomosynthesis dated 08/23/2012.  DIGITAL DIAGNOSTIC RIGHT LIMITED MAMMOGRAM  AND RIGHT BREAST ULTRASOUND:  Comparison:  07/25/2011, 07/11/2010, 07/05/2009  Findings:  Magnification views demonstrate pleomorphic microcalcifications within an area of increased density in the right upper outer quadrant.  The calcifications cover an area of approximately 5.3 x 4.5 x 4.3 cm mammographically.  A lymph node in the right axilla has increased in size since prior studies.  On physical exam, I palpate a 4 cm mass in the right upper outer quadrant at 10 o'clock approximately 4 cm from the nipple.  Ultrasound is performed, showing an ill-defined hypoechoic mass with posterior shadowing at 10 o'clock 4 cm from the right nipple. This is difficult to measure accurately but is at least 2.7 cm in size.  There is an abnormal lymph node in the right axilla with an eccentrically thickened cortex.  The appearance is highly suspicious for invasive mammary carcinoma with possible axillary metastasis.  Biopsy is recommended.   Ultrasound-guided core needle biopsy and surgical excisional biopsy were discussed with the patient.  I suggested ultrasound-guided core needle biopsy and the patient agreed with this plan.  IMPRESSION: Suspicious mass and microcalcifications in the right upper outer quadrant with abnormal right axillary lymph node.  The appearance is highly suspicious for invasive mammary carcinoma with possible axillary metastasis.  RECOMMENDATION: Ultrasound-guided core needle biopsy of the right upper outer quadrant mass in the right axillary lymph node.  This will be performed and reported separately.  Report was telephoned to [facility] at Dr. Chauncy Passy office.  BI-RADS CATEGORY 5:  Highly suggestive of malignancy - appropriate action should be taken.   Original Report Authenticated By: Daryl Eastern, M.D.    Mr Breast Bilateral W Wo Contrast  09/06/2012  *RADIOLOGY REPORT*  Clinical Data: New diagnosis right-sided breast cancer with right axillary metastasis.  BILATERAL BREAST MRI WITH AND WITHOUT CONTRAST  Technique: Multiplanar, multisequence MR images  of both breasts were obtained prior to and following the intravenous administration of 12ml of Multihance.  Three dimensional images were evaluated at the independent DynaCad workstation.  Comparison:  None.  Findings: Mild parenchymal enhancement seen bilaterally.  An ill- defined heterogeneously enhancing mass is seen in the upper outer quadrant of the right breast extending from anterior to posterior aspect measuring 4.7 (AP) x 3.6 (trv) x 5.2 (cc) cm with associated biopsy clip artifact centered in the mass.  No additional areas of enhancement are seen in either breast.  An abnormal lymph node with an attenuated central fatty hilum is seen in the right axilla measuring 1.5 x 1.0 x 2.0 cm corresponding to the known metastatic disease.  Two other level 1 lymph nodes  are seen in the right axilla with an attenuated fatty hila also concerning for metastatic disease.  No  internal mammary or left axillary adenopathy is seen.  A focus of high T2 signal is seen in the right hepatic lobe measuring 0.4 cm likely a benign cyst or hemangioma.  IMPRESSION: Known malignancy, right breast and right axillary metastatic disease.  No MRI specific evidence of malignancy, left breast.  RECOMMENDATION: Treatment plan, right breast.  THREE-DIMENSIONAL MR IMAGE RENDERING ON INDEPENDENT WORKSTATION:  Three-dimensional MR images were rendered by post-processing of the original MR data on an independent workstation.  The three- dimensional MR images were interpreted, and findings were reported in the accompanying complete MRI report for this study.  BI-RADS CATEGORY 6:  Known biopsy-proven malignancy - appropriate action should be taken.   Original Report Authenticated By: Hiram Gash, M.D.    [place] Core Biopsy  09/01/2012  *RADIOLOGY REPORT*  Clinical Data:  Mass with microcalcifications in the right upper outer quadrant and an abnormal right axillary lymph node.  The patient has a history of left breast cancer with lumpectomy and radiation therapy in 2003 in [place].  ULTRASOUND GUIDED VACUUM ASSISTED CORE BIOPSY OF THE RIGHT BREAST AND ULTRASOUND-GUIDED CORE BIOPSY OF THE RIGHT AXILLA  The patient and I discussed the procedure of ultrasound-guided biopsy, including benefits and alternatives.  We discussed the high likelihood of a successful procedure. We discussed the risks of the procedure including infection, bleeding, tissue injury, clip migration, and inadequate sampling.  Informed written consent was given.  Using sterile technique, 2% lidocaine, ultrasound guidance, and a 12 gauge vacuum assisted needle, biopsy was performed of the mass in the right upper outer quadrant using a caudocranial approach. At the conclusion of the procedure, a ribbon tissue marker clip was deployed into the biopsy cavity.  Using sterile technique,2% lidocaine, ultrasound guidance, and a 14 gauge  automated biopsy device, biopsy was performed of  the abnormal right axillary lymph node.  Follow-up 2-view mammogram was performed and dictated separately.  Histologic evaluation of the right upper outer quadrant mass demonstrates invasive mammary carcinoma.  This is favored to be a grade II/III invasive ductal carcinoma.  Histologic evaluation of the right axillary lymph node demonstrates metastatic carcinoma. These findings are concordant with the imaging findings.  Results were discussed with the patient by telephone at her request.  She reports no complications from the procedure.  The patient is scheduled to be seen in the [facility] on 09/08/2012.  She is scheduled for breast MRI on 09/06/2012.  IMPRESSION: Ultrasound-guided biopsy of a mass in the right upper outer quadrant and an abnormal right axillary lymph node.  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  The patient is scheduled for the [facility] on 09/08/2012 and for breast MRI on 09/06/2012.  No apparent complications.   Original Report Authenticated By: Daryl Eastern, M.D.    [place] Core Biopsy  09/01/2012  *RADIOLOGY REPORT*  Clinical Data:  Mass with microcalcifications in the right upper outer quadrant and an abnormal right axillary lymph node.  The patient has a history of left breast cancer with lumpectomy and radiation therapy in 2003 in [place].  ULTRASOUND GUIDED VACUUM ASSISTED CORE BIOPSY OF THE RIGHT BREAST AND ULTRASOUND-GUIDED CORE BIOPSY OF THE RIGHT AXILLA  The patient and I discussed the procedure of ultrasound-guided biopsy, including benefits and alternatives.  We discussed the high likelihood of a successful procedure. We discussed the risks of the procedure including infection, bleeding, tissue injury, clip migration, and inadequate sampling.  Informed written consent was given.  Using  sterile technique, 2% lidocaine, ultrasound guidance, and a 12 gauge vacuum assisted needle, biopsy was performed of the mass in the right upper outer quadrant using a caudocranial approach. At the conclusion of the procedure, a ribbon tissue marker clip was deployed into the biopsy cavity.  Using sterile technique,2% lidocaine, ultrasound guidance, and a 14 gauge automated biopsy device, biopsy was performed of  the abnormal right axillary lymph node.  Follow-up 2-view mammogram was performed and dictated separately.  Histologic evaluation of the right upper outer quadrant mass demonstrates invasive mammary carcinoma.  This is favored to be a grade II/III invasive ductal carcinoma.  Histologic evaluation of the right axillary lymph node demonstrates metastatic carcinoma. These findings are concordant with the imaging findings.  Results were discussed with the patient by telephone at her request.  She reports no complications from the procedure.  The patient is scheduled to be seen in the [facility] on 09/08/2012.  She is scheduled for breast MRI on 09/06/2012.  IMPRESSION: Ultrasound-guided biopsy of a mass in the right upper outer quadrant and an abnormal right axillary lymph node.  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  The patient is scheduled for the [facility] on 09/08/2012 and for breast MRI on 09/06/2012.  No apparent complications.   Original Report Authenticated By: Daryl Eastern, M.D.    Mm Digital Diag Ltd R  08/31/2012  *RADIOLOGY REPORT*  Clinical Data:  The patient returns for evaluation of possible mass with microcalcifications in the right upper outer quadrant noted on recent screening mammogram with tomosynthesis dated 08/23/2012.  DIGITAL DIAGNOSTIC RIGHT LIMITED MAMMOGRAM  AND RIGHT BREAST ULTRASOUND:  Comparison:  07/25/2011, 07/11/2010, 07/05/2009   Findings:  Magnification views demonstrate pleomorphic microcalcifications within an area of increased density in the right upper outer quadrant.  The calcifications cover an area of approximately 5.3 x 4.5 x 4.3 cm mammographically.  A lymph node in the right axilla has increased in size since prior studies.  On physical exam, I palpate a 4 cm mass in the right upper outer quadrant at 10 o'clock approximately 4 cm from the nipple.  Ultrasound is performed, showing an ill-defined hypoechoic mass with posterior shadowing at 10 o'clock 4 cm from the right nipple. This is difficult to measure accurately but is at least 2.7 cm in size.  There is an abnormal lymph node in the right axilla with an eccentrically thickened cortex.  The appearance is highly suspicious for invasive mammary carcinoma with possible axillary metastasis.  Biopsy  is recommended.  Ultrasound-guided core needle biopsy and surgical excisional biopsy were discussed with the patient.  I suggested ultrasound-guided core needle biopsy and the patient agreed with this plan.  IMPRESSION: Suspicious mass and microcalcifications in the right upper outer quadrant with abnormal right axillary lymph node.  The appearance is highly suspicious for invasive mammary carcinoma with possible axillary metastasis.  RECOMMENDATION: Ultrasound-guided core needle biopsy of the right upper outer quadrant mass in the right axillary lymph node.  This will be performed and reported separately.  Report was telephoned to [facility] at Dr. Chauncy Passy office.  BI-RADS CATEGORY 5:  Highly suggestive of malignancy - appropriate action should be taken.   Original Report Authenticated By: Daryl Eastern, M.D.    Mm Digital Diagnostic Unilat R  08/31/2012  *RADIOLOGY REPORT*  Clinical Data:  Ultrasound-guided core needle biopsy of a mass in the right upper outer quadrant with clip placement.  DIGITAL DIAGNOSTIC RIGHT MAMMOGRAM  Comparison:  Previous exams.  Findings:  Films are  performed following ultrasound guided biopsy of a mass at 10 o'clock in the right breast.  The ribbon clip is appropriately positioned within the mass.  IMPRESSION: Appropriate clip placement following ultrasound-guided core needle biopsy of a mass at 10 o'clock in the right breast.   Original Report Authenticated By: Daryl Eastern, M.D.    Mm Digital Screening  08/24/2012  *RADIOLOGY REPORT*  Clinical Data: Screening.  DIGITAL BILATERAL SCREENING MAMMOGRAM WITH CAD  DIGITAL BREAST TOMOSYNTHESIS  Digital breast tomosynthesis images are acquired in two projections.  These images are reviewed in combination with the digital mammogram, confirming the findings below.  Comparison:  Previous exams.  Findings: Two views of each breast demonstrate heterogeneously dense tissue.  In the right breast, calcifications warrant further evaluation with magnified views.  In the left breast, no mass or malignant type calcifications are identified.  Images were processed with CAD.  IMPRESSION: Further evaluation is suggested for calcifications in the right breast.  RECOMMENDATION: Diagnostic mammogram of the right breast. (Code:[id number]-98M)  BI-RADS CATEGORY 0:  Incomplete.  Need additional imaging evaluation and/or prior mammograms for comparison.   Original Report Authenticated By: Darrol Angel, M.D.      LABS:    Chemistry      Component Value Date/Time   NA 144 09/08/2012 0836   NA 144 01/25/2007 1220   K 3.8 09/08/2012 0836   K 4.3 01/25/2007 1220   CL 106 09/08/2012 0836   CL 103 01/25/2007 1220   CO2 25 09/08/2012 0836   CO2 34* 01/25/2007 1220   BUN 13.0 09/08/2012 0836   BUN 13 01/25/2007 1220   CREATININE 0.7 09/08/2012 0836   CREATININE 0.6 01/25/2007 1220      Component Value Date/Time   CALCIUM 9.7 09/08/2012 0836   CALCIUM 9.9 01/25/2007 1220   ALKPHOS 79 09/08/2012 0836   ALKPHOS 85 01/25/2007 1220   AST 18 09/08/2012 0836   AST 22 01/25/2007 1220   ALT 22 09/08/2012 0836   ALT 25 01/25/2007  1220   BILITOT 0.60 09/08/2012 0836   BILITOT 0.8 01/25/2007 1220      Lab Results  Component Value Date   WBC 7.2 09/08/2012   HGB 13.5 09/08/2012   HCT 38.6 09/08/2012   MCV 93.4 09/08/2012   PLT 210 09/08/2012       PATHOLOGY: ER/PR positive HER-2 negative breast cancer  ASSESSMENT    Locally advanced breast cancer, require mastectomy discussed role for neoadjuvant therapy.  Clinical Trial  Eligibility: Yes Multidisciplinary conference discussion yes    PLAN:    Patient is here of discuss treatment options. She has an excellent performance status. She recently underwent a treatment for central retinal vein occlusion is doing much better with improved vision. Overall she's doing well no clinical or other corresponding health related issues. Current performance status ECoG 0. Discussion about the neoadjuvant therapy. She switched in preserving the breast. At this point she'll likely require mastectomy given the size of the mass in her breast. I did discuss of downstaging with neoadjuvant hormonal therapy. I also discussed a clinical research trial which looks at Oncotype testing as a determinant of chemotherapy versus hormonal therapy versus randomization thereof. She is interested in pursuing this. I discussed with neoadjuvant hormonal therapy if she doesn't receive this could take many months to achieve a result. [facility] research nurses be with the patient and to determine eligibility for this study. [place] and with staging scans. I will plan to see the patient back in 7-10 days discuss this further with her.        Discussion: Patient is being treated per NCCN breast cancer care guidelines appropriate for stage.3   Thank you so much for allowing me to participate in the care of Mary Young. I will continue to follow up the patient with you and assist in her care.  All questions were answered. The patient knows to call the clinic with any problems, questions or  concerns. We can certainly see the patient much sooner if necessary.  I spent 55 minutes counseling the patient face to face. The total time spent in the appointment was 30 minutes.      Mervin Hack M.D. FRCP C.  09/13/2012, 3:26 PM

## 2012-09-14 ENCOUNTER — Encounter (HOSPITAL_COMMUNITY)
Admission: RE | Admit: 2012-09-14 | Discharge: 2012-09-14 | Disposition: A | Discharge status: Home / Self Care | Payer: Medicare Other | Source: Ambulatory Visit | Attending: Oncology | Admitting: Oncology

## 2012-09-14 DIAGNOSIS — D1809 Hemangioma of other sites: Secondary | ICD-10-CM | POA: Insufficient documentation

## 2012-09-14 DIAGNOSIS — C50419 Malignant neoplasm of upper-outer quadrant of unspecified female breast: Secondary | ICD-10-CM | POA: Insufficient documentation

## 2012-09-14 LAB — GLUCOSE, CAPILLARY: Glucose-Capillary: 123 mg/dL — ABNORMAL HIGH (ref 70–99)

## 2012-09-14 MED ORDER — FLUDEOXYGLUCOSE F - 18 (FDG) INJECTION
17.4000 | Freq: Once | INTRAVENOUS | Status: AC | PRN
  Administered 2012-09-14: 17.4 via INTRAVENOUS

## 2012-09-15 MED ORDER — LETROZOLE 2.5 MG PO TABS: 2.5000 mg | ORAL_TABLET | Freq: Every day | ORAL | Status: DC

## 2012-09-15 NOTE — Progress Notes (Signed)
This is a doctor-patient her phone call you had with the patient. She is not eligible for our study because of her previous history of microinvasive breast cancer in 2003. I discussed this with her. And I elected to put her on neoadjuvant hormonal therapy with letrozole. Her PET scan looks essentially unremarkable apart from involvement of the lymph node and the primary breast. I will also go ahead in a tightness in the Oncotype test that. I sent a prescription letrozole to the pharmacy I will see the patient back in 6-8 weeks. She sees Dr. Derrell Lolling tomorrow.  I discussed side effects with her as well.  Mervin Hack M.D. FRCP C.

## 2012-09-16 ENCOUNTER — Encounter (INDEPENDENT_AMBULATORY_CARE_PROVIDER_SITE_OTHER): Payer: Self-pay | Admitting: General Surgery

## 2012-09-16 ENCOUNTER — Ambulatory Visit (INDEPENDENT_AMBULATORY_CARE_PROVIDER_SITE_OTHER): Payer: Medicare Other | Admitting: General Surgery

## 2012-09-16 ENCOUNTER — Other Ambulatory Visit: Payer: Self-pay | Admitting: Oncology

## 2012-09-16 VITALS — BP 138/80 | HR 77 | Temp 97.0°F | Resp 18 | Ht 63.0 in | Wt 142.0 lb

## 2012-09-16 DIAGNOSIS — C50419 Malignant neoplasm of upper-outer quadrant of unspecified female breast: Secondary | ICD-10-CM

## 2012-09-16 DIAGNOSIS — Z853 Personal history of malignant neoplasm of breast: Secondary | ICD-10-CM

## 2012-09-16 NOTE — Patient Instructions (Signed)
I agree with the treatment plan outlined.  Continue to take the letrozole that was prescribed by Dr. Donnie Coffin.  Return to see Dr. Derrell Lolling in 2 months to see whether you are responding to this treatment.  We will keep all her surgical options open until we see what the response to therapy is. We will discuss surgical treatment plan as we go along. You will need another breast MRI in a few months.

## 2012-09-16 NOTE — Progress Notes (Addendum)
Patient ID: Mary Young, female   DOB: Oct 02, 1941, 71 y.o.   MRN: [id number]  Chief Complaint  Patient presents with  . New Evaluation    Rt Br    HPI Mary Young is a 71 y.o. female.  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 She was recently seen in the [facility] by Dr. Caron Presume, Dr. Michell Heinrich, and Dr. Biagio Quint.  This patient has a past history of a left partial mastectomy and sentinel node biopsy and postop radiation therapy in [place], [place] in 2003. It sounds like this was a microinvasive cancer and node negative.  She has been in [place] for 6 or 7 years. She gets annual mammograms and  annual breast exam. Recent imaging studies including mammogram ultrasound and MRI showed a large tumor in the right breast, upper outer quadrant. The MRI shows this to be  5.2 cm in greatest dimension. There was also a 2.0 cm right axillary lymph node. There is also a cyst or hemangioma in the right lobe of the liver.  The left breast looked fine.  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 ER 100%, PR 96%, proliferation marker 66%, HER-2-negative.  Family history is negative for breast or ovarian cancer.  Her health is good. She does have a heart murmur and is followed by [facility]. She has hypertension. She has glaucoma and has had a retinal vein occlusion treated by ophthalmology at [facility].  MDC conclusion was that she is not a candidate for clinical trial  because of her previous left breast cancer and so she has started on letrozole yesterday. She is to follow with Dr. Donnie Coffin in 4-6 weeks. HPI  Past Medical History  Diagnosis Date  . Breast cancer   . Hypertension   . Glaucoma   . Central retinal vein occlusion   .  Asthma   . Joint pain     Past Surgical History  Procedure Date  . Abdominal hysterectomy   . Breast lumpectomy 2003  . Arthroscopy knee w/ drilling 2005/2008    Left knee/Right knee  . Tubal ligation 1983    Family History  Problem Relation Age of Onset  . Cancer Brother     tumor near liver pancrese   half brother    Social History History  Substance Use Topics  . Smoking status: Never Smoker   . Smokeless tobacco: Not on file  . Alcohol Use: Yes    Allergies  Allergen Reactions  . Timoptic (Timolol Maleate) Itching    redness  . Percocet (Oxycodone-Acetaminophen)   . Precipitated Sulfur (Sulfur)     Current Outpatient Prescriptions  Medication Sig Dispense Refill  . aspirin 81 MG tablet Take 81 mg by mouth daily.      . B Complex-C (SUPER B COMPLEX PO) Take by mouth daily.      . brimonidine (ALPHAGAN) 0.15 % ophthalmic solution       . Calcium-Vitamin D-Vitamin K 500-100-40 MG-UNT-MCG CHEW Chew by mouth daily. 2 po daily      . dorzolamide (TRUSOPT) 2 % ophthalmic solution       . letrozole (FEMARA) 2.5 MG tablet Take 1 tablet (2.5 mg total) by mouth daily.  30 tablet  11  . lisinopril (PRINIVIL,ZESTRIL) 10 MG tablet       .  Multiple Vitamin (MULTIVITAMIN) tablet Take 1 tablet by mouth daily.      . simvastatin (ZOCOR) 20 MG tablet       . TRAVATAN Z 0.004 % SOLN ophthalmic solution       . vitamin E 400 UNIT capsule Take 400 Units by mouth daily.        Review of Systems Review of Systems  Constitutional: Negative for fever, chills and unexpected weight change.  HENT: Negative for hearing loss, congestion, sore throat, trouble swallowing and voice change.   Eyes: Negative for visual disturbance.  Respiratory: Negative for cough and wheezing.   Cardiovascular: Negative for chest pain, palpitations and leg swelling.  Gastrointestinal: Negative for nausea, vomiting, abdominal pain, diarrhea, constipation, blood in stool, abdominal distention and anal  bleeding.  Genitourinary: Negative for hematuria, vaginal bleeding and difficulty urinating.  Musculoskeletal: Negative for arthralgias.  Skin: Negative for rash and wound.  Neurological: Negative for seizures, syncope and headaches.  Hematological: Negative for adenopathy. Does not bruise/bleed easily.  Psychiatric/Behavioral: Negative for confusion.    Blood pressure 138/80, pulse 77, temperature 97 F (36.1 C), temperature source Oral, resp. rate 18, height 5\' 3"  (1.6 m), weight 142 lb (64.411 kg).  Physical Exam Physical Exam  Constitutional: She is oriented to person, place, and time. She appears well-developed and well-nourished. No distress.       Healthy appearing. Husband is with her throughout the encounter.  HENT:  Head: Normocephalic and atraumatic.  Nose: Nose normal.  Mouth/Throat: No oropharyngeal exudate.  Eyes: Conjunctivae normal and EOM are normal. Pupils are equal, round, and reactive to light. Left eye exhibits no discharge. No scleral icterus.  Neck: Neck supple. No JVD present. No tracheal deviation present. No thyromegaly present.  Cardiovascular: Normal rate, regular rhythm, normal heart sounds and intact distal pulses.   No murmur heard. Pulmonary/Chest: Effort normal and breath sounds normal. No respiratory distress. She has no wheezes. She has no rales. She exhibits no tenderness.       Right breast reveals a large mass in the upper outer quadrant, 5 cm. This is mobile.  No skin change. Nipple and areolar complex normal. Right axilla does not reveal any significant pathologic lymph nodes. Left breast reveals well-healed transverse scar lower inner quadrant and well-healed left axillary scar. No palpable mass. No axillary adenopathy.  Abdominal: Soft. Bowel sounds are normal. She exhibits no distension and no mass. There is no tenderness. There is no rebound and no guarding.       Pfannenstiel scar from prior hysterectomy  Musculoskeletal: She exhibits no edema  and no tenderness.  Lymphadenopathy:    She has no cervical adenopathy.  Neurological: She is alert and oriented to person, place, and time. She exhibits normal muscle tone. Coordination normal.  Skin: Skin is warm. No rash noted. She is not diaphoretic. No erythema. No pallor.  Psychiatric: She has a normal mood and affect. Her behavior is normal. Judgment and thought content normal.    Data Reviewed [facility] notes from all providers. All imaging studies. Pathology report.  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 A 5 cm tumor, receptor positive, HER-2-negative. Clinical stage T3, N1, stage IIIa.  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 No evidence of recurrence 10 years following breast conservation surgery and adjuvant radiation therapy.  Hypertension  Heart murmur  Glaucoma  Recent retinal vein occlusion  Negative family history for breast cancer  Plan    Very long conversation with the patient and husband. Went over treatment options. I agreed with neoadjuvant letrozole  Told her that this would probably down stage the tumor and allow option of mastectomy versus lumpectomy, but we would have to monitor her very closely. Told her she would need end of treatment MRI.  She asked about contralateral and bilateral mastectomy. She had a friend that did this. I told her that would reduce the chance of a third cancer developing left breast down the road but did not think there is any evidence that it would confer a survival benefit.  At the end of this conversation, they were comfortable with the treatment plan.  Return to see me in 2 months.       Angelia Mould. Derrell Lolling, M.D., [facility] General and Minimally invasive Surgery Breast and Colorectal Surgery Office:   [phone] Pager:   [phone]  09/16/2012, 11:58  AM

## 2012-09-23 ENCOUNTER — Encounter: Payer: Self-pay | Admitting: *Deleted

## 2012-09-23 NOTE — Progress Notes (Signed)
Received Oncotype Dx results of 28.  Gave copy to MD.  Rochele Pages copy to Med Rec to scan.

## 2012-10-08 ENCOUNTER — Telehealth: Payer: Self-pay | Admitting: *Deleted

## 2012-10-08 NOTE — Telephone Encounter (Signed)
Left voice message to inform the patient of the new date and time on 11-16-2012

## 2012-10-29 ENCOUNTER — Ambulatory Visit: Payer: Medicare Other | Admitting: Oncology

## 2012-10-29 ENCOUNTER — Other Ambulatory Visit: Payer: Medicare Other | Admitting: Lab

## 2012-11-01 ENCOUNTER — Encounter (INDEPENDENT_AMBULATORY_CARE_PROVIDER_SITE_OTHER): Payer: Medicare Other | Admitting: General Surgery

## 2012-11-15 ENCOUNTER — Other Ambulatory Visit: Payer: Self-pay | Admitting: *Deleted

## 2012-11-15 ENCOUNTER — Encounter (INDEPENDENT_AMBULATORY_CARE_PROVIDER_SITE_OTHER): Payer: Medicare Other | Admitting: General Surgery

## 2012-11-15 DIAGNOSIS — C50419 Malignant neoplasm of upper-outer quadrant of unspecified female breast: Secondary | ICD-10-CM

## 2012-11-16 ENCOUNTER — Telehealth: Payer: Self-pay | Admitting: *Deleted

## 2012-11-16 ENCOUNTER — Ambulatory Visit (HOSPITAL_BASED_OUTPATIENT_CLINIC_OR_DEPARTMENT_OTHER): Payer: Medicare Other | Admitting: Oncology

## 2012-11-16 ENCOUNTER — Other Ambulatory Visit (HOSPITAL_BASED_OUTPATIENT_CLINIC_OR_DEPARTMENT_OTHER): Payer: Medicare Other | Admitting: Lab

## 2012-11-16 ENCOUNTER — Other Ambulatory Visit: Payer: Self-pay | Admitting: Lab

## 2012-11-16 VITALS — BP 146/80 | HR 91 | Temp 98.4°F | Resp 18 | Ht 63.0 in | Wt 139.0 lb

## 2012-11-16 DIAGNOSIS — Z170 Estrogen receptor positive status [ER+]: Secondary | ICD-10-CM

## 2012-11-16 DIAGNOSIS — C50419 Malignant neoplasm of upper-outer quadrant of unspecified female breast: Secondary | ICD-10-CM

## 2012-11-16 LAB — CBC WITH DIFFERENTIAL/PLATELET
BASO%: 0.6 % (ref 0.0–2.0)
Basophils Absolute: 0 10*3/uL (ref 0.0–0.1)
EOS%: 2.1 % (ref 0.0–7.0)
Eosinophils Absolute: 0.1 10*3/uL (ref 0.0–0.5)
HCT: 36.9 % (ref 34.8–46.6)
HGB: 12.9 g/dL (ref 11.6–15.9)
LYMPH%: 30.7 % (ref 14.0–49.7)
MCH: 32.7 pg (ref 25.1–34.0)
MCHC: 34.9 g/dL (ref 31.5–36.0)
MCV: 93.8 fL (ref 79.5–101.0)
MONO#: 0.4 10*3/uL (ref 0.1–0.9)
MONO%: 6.8 % (ref 0.0–14.0)
NEUT#: 3.6 10*3/uL (ref 1.5–6.5)
NEUT%: 59.8 % (ref 38.4–76.8)
Platelets: 226 10*3/uL (ref 145–400)
RBC: 3.94 10*6/uL (ref 3.70–5.45)
RDW: 12.2 % (ref 11.2–14.5)
WBC: 6.1 10*3/uL (ref 3.9–10.3)
lymph#: 1.9 10*3/uL (ref 0.9–3.3)

## 2012-11-16 LAB — COMPREHENSIVE METABOLIC PANEL (CC13)
ALT: 19 U/L (ref 0–55)
AST: 18 U/L (ref 5–34)
Albumin: 3.6 g/dL (ref 3.5–5.0)
Alkaline Phosphatase: 88 U/L (ref 40–150)
BUN: 17 mg/dL (ref 7.0–26.0)
CO2: 29 mEq/L (ref 22–29)
Calcium: 9.5 mg/dL (ref 8.4–10.4)
Chloride: 105 mEq/L (ref 98–107)
Creatinine: 0.8 mg/dL (ref 0.6–1.1)
Glucose: 95 mg/dl (ref 70–99)
Potassium: 3.9 mEq/L (ref 3.5–5.1)
Sodium: 142 mEq/L (ref 136–145)
Total Bilirubin: 0.36 mg/dL (ref 0.20–1.20)
Total Protein: 6.4 g/dL (ref 6.4–8.3)

## 2012-11-16 LAB — LACTATE DEHYDROGENASE (CC13): LDH: 184 U/L (ref 125–245)

## 2012-11-16 NOTE — Progress Notes (Signed)
Hematology and Oncology Follow Up Visit  Mary Young [id number] [birth year] 71 y.o. 11/16/2012 4:07 PM   DIAGNOSIS:   Encounter Diagnosis  Name Primary?  . Cancer of upper-outer quadrant of female breast Yes   Locally advanced ER+ breast cancer on neoadjuvant letrozole therapy. Since 10/13.  PAST THERAPY:  Letrozole therapy  Interim History:  She is doing well, no significant s/e from Letrozole. She has noted some muscle cramps in her legs. She cannot be certain of any change in her breast mass.   Medications: I have reviewed the patient's current medications.  Allergies:  Allergies  Allergen Reactions  . Timoptic (Timolol Maleate) Itching    redness  . Percocet (Oxycodone-Acetaminophen)   . Precipitated Sulfur (Sulfur)     Past Medical History, Surgical history, Social history, and Family History were reviewed and updated.  Review of Systems: Constitutional:  Negative for fever, chills, night sweats, anorexia, weight loss, pain. Cardiovascular: no chest pain or dyspnea on exertion Respiratory: no cough, shortness of breath, or wheezing Neurological: no TIA or stroke symptoms Dermatological: negative ENT: negative Skin Gastrointestinal: no abdominal pain, change in bowel habits, or black or bloody stools Genito-Urinary: no dysuria, trouble voiding, or hematuria Hematological and Lymphatic: negative Breast: negative for breast lumps Musculoskeletal: negative Remaining ROS negative.  Physical Exam:  Blood pressure 146/80, pulse 91, temperature 98.4 F (36.9 C), temperature source Oral, resp. rate 18, height 5\' 3"  (1.6 m), weight 139 lb (63.05 kg).  ECOG: 0 HEENT:  Sclerae anicteric, conjunctivae pink.  Oropharynx clear.  No mucositis or candidiasis.  Nodes:  No cervical, supraclavicular, or axillary lymphadenopathy palpated.  Breast Exam:  Right breast has a central mass measuring 3-4 cm, it appears softer ad less apparent with less distortion to the breast. ,  axillary nodes ares less prominent on the right side.  Left breast is benign.  No masses, discharge, skin change, or nipple inversion..  Lungs:  Clear to auscultation bilaterally.  No crackles, rhonchi, or wheezes.  Heart:  Regular rate and rhythm.  Abdomen:  Soft, nontender.  Positive bowel sounds.  No organomegaly or masses palpated.  Musculoskeletal:  No focal spinal tenderness to palpation.  Extremities:  Benign.  No peripheral edema or cyanosis.  Skin:  Benign.  Neuro:  Nonfocal.     Lab Results: Lab Results  Component Value Date   WBC 6.1 11/16/2012   HGB 12.9 11/16/2012   HCT 36.9 11/16/2012   MCV 93.8 11/16/2012   PLT 226 11/16/2012     Chemistry      Component Value Date/Time   NA 142 11/16/2012 1432   NA 144 01/25/2007 1220   K 3.9 11/16/2012 1432   K 4.3 01/25/2007 1220   CL 105 11/16/2012 1432   CL 103 01/25/2007 1220   CO2 29 11/16/2012 1432   CO2 34* 01/25/2007 1220   BUN 17.0 11/16/2012 1432   BUN 13 01/25/2007 1220   CREATININE 0.8 11/16/2012 1432   CREATININE 0.6 01/25/2007 1220      Component Value Date/Time   CALCIUM 9.5 11/16/2012 1432   CALCIUM 9.9 01/25/2007 1220   ALKPHOS 88 11/16/2012 1432   ALKPHOS 85 01/25/2007 1220   AST 18 11/16/2012 1432   AST 22 01/25/2007 1220   ALT 19 11/16/2012 1432   ALT 25 01/25/2007 1220   BILITOT 0.36 11/16/2012 1432   BILITOT 0.8 01/25/2007 1220       Radiological Studies:  No results found.   IMPRESSIONS AND PLAN: A 71 y.o.  female with   Locally advanced rt breast cancer, responding to letrozole. She sees dr Derrell Lolling in early jan/14. She will get a f/u u/s in 6 weeks. I suggested that a minimal of 6 months might be required to saee an optimal response.  Spent more than half the time coordinating care, as well as discussion of BMI and its implications.      Mary Young 12/31/20134:07 PM Cell [phone]

## 2012-11-16 NOTE — Telephone Encounter (Signed)
Gave patient instructions on getting her 2014 appointment 

## 2012-11-17 HISTORY — PX: MASTECTOMY: SHX3

## 2012-11-18 ENCOUNTER — Other Ambulatory Visit: Payer: Self-pay | Admitting: *Deleted

## 2012-11-18 ENCOUNTER — Other Ambulatory Visit: Payer: Self-pay | Admitting: Oncology

## 2012-11-18 DIAGNOSIS — C50419 Malignant neoplasm of upper-outer quadrant of unspecified female breast: Secondary | ICD-10-CM

## 2012-11-22 ENCOUNTER — Ambulatory Visit
Admission: RE | Admit: 2012-11-22 | Discharge: 2012-11-22 | Disposition: A | Discharge status: Home / Self Care | Payer: Medicare Other | Source: Ambulatory Visit | Attending: Oncology | Admitting: Oncology

## 2012-11-22 DIAGNOSIS — C50419 Malignant neoplasm of upper-outer quadrant of unspecified female breast: Secondary | ICD-10-CM

## 2012-11-23 ENCOUNTER — Ambulatory Visit (INDEPENDENT_AMBULATORY_CARE_PROVIDER_SITE_OTHER): Payer: Medicare Other | Admitting: General Surgery

## 2012-11-23 ENCOUNTER — Encounter (INDEPENDENT_AMBULATORY_CARE_PROVIDER_SITE_OTHER): Payer: Self-pay | Admitting: General Surgery

## 2012-11-23 ENCOUNTER — Telehealth: Payer: Self-pay | Admitting: *Deleted

## 2012-11-23 VITALS — BP 136/64 | HR 83 | Temp 97.6°F | Resp 18 | Ht 63.5 in | Wt 139.0 lb

## 2012-11-23 DIAGNOSIS — C50419 Malignant neoplasm of upper-outer quadrant of unspecified female breast: Secondary | ICD-10-CM

## 2012-11-23 NOTE — Progress Notes (Signed)
Patient ID: Mary Young, female   DOB: 11-20-1940, 72 y.o.   MRN: [id number]  Chief Complaint  Patient presents with  . Follow-up    breast    HPI Mary Young is a 72 y.o. female.  She returns for followup during her neoadjuvant sure therapy for her right breast cancer.  This patient initially presented almost 3 months ago with a large tumor in her right breast, upper outer quadrant. Imaging studies showed a 5.2 cm tumor and a 2.0 cm right axillary lymph node. Both were biopsied and both showed invasive mammary carcinoma, probably ductal phenotype, ER 100%, PR 9%,  Ki-67 60%, HER-2-negative and clinical stage T3, N1.  She has a past history of left partial mastectomy and sentinel node biopsy and radiation therapy in [facility]. His cellulitis might have been microinvasive, node negative disease. She has no evidence of recurrence there.  Family history is negative for breast cancer.  He is currently on neoadjuvant letrozole. She saw Dr. Donnie Coffin on December 31 he thought the tumor might be softer and the breast a little bit less deformed. She has not established herself with another medical oncologist yet since Dr. Donnie Coffin is leaving town. Ultrasound of the right breast performed yesterday showed that the lymph nodes in the right axilla are smaller, down to  6 mm. The tumor itself is still 5 cm in greatest dimension.  We talked a long time about treatment options. We talked about slower response to antiestrogen  therapy, hopefully allowing ultimately a lumpectomy. She also wanted to talk a little bit about unilateral or bilateral mastectomy. At this point in time she still wants  to give the antiestrogen therapy a try which I think is reasonable. HPI  Past Medical History  Diagnosis Date  . Breast cancer   . Hypertension   . Glaucoma   . Central retinal vein occlusion   . Asthma   . Joint pain     Past Surgical History  Procedure Date  . Abdominal hysterectomy     . Breast lumpectomy 2003  . Arthroscopy knee w/ drilling 2005/2008    Left knee/Right knee  . Tubal ligation 1983    Family History  Problem Relation Age of Onset  . Cancer Brother     tumor near liver pancrese   half brother    Social History History  Substance Use Topics  . Smoking status: Never Smoker   . Smokeless tobacco: Not on file  . Alcohol Use: Yes    Allergies  Allergen Reactions  . Timoptic (Timolol Maleate) Itching    redness  . Percocet (Oxycodone-Acetaminophen)   . Precipitated Sulfur (Sulfur)   . Timolol Rash    Current Outpatient Prescriptions  Medication Sig Dispense Refill  . aspirin 81 MG tablet Take 81 mg by mouth daily.      . B Complex-C (SUPER B COMPLEX PO) Take by mouth daily.      . brimonidine (ALPHAGAN) 0.15 % ophthalmic solution       . Calcium-Vitamin D-Vitamin K 500-100-40 MG-UNT-MCG CHEW Chew by mouth daily. 2 po daily      . dorzolamide (TRUSOPT) 2 % ophthalmic solution       . letrozole (FEMARA) 2.5 MG tablet Take 1 tablet (2.5 mg total) by mouth daily.  30 tablet  11  . lisinopril (PRINIVIL,ZESTRIL) 10 MG tablet       . Multiple Vitamin (MULTIVITAMIN) tablet Take 1 tablet by mouth daily.      Marland Kitchen  simvastatin (ZOCOR) 20 MG tablet       . TRAVATAN Z 0.004 % SOLN ophthalmic solution       . vitamin E 400 UNIT capsule Take 400 Units by mouth daily.        Review of Systems Review of Systems  Constitutional: Negative for fever, chills and unexpected weight change.  HENT: Negative for hearing loss, congestion, sore throat, trouble swallowing and voice change.   Eyes: Negative for visual disturbance.  Respiratory: Negative for cough and wheezing.   Cardiovascular: Negative for chest pain, palpitations and leg swelling.  Gastrointestinal: Negative for nausea, vomiting, abdominal pain, diarrhea, constipation, blood in stool, abdominal distention and anal bleeding.  Genitourinary: Negative for hematuria, vaginal bleeding and difficulty  urinating.  Musculoskeletal: Negative for arthralgias.  Skin: Negative for rash and wound.  Neurological: Negative for seizures, syncope and headaches.  Hematological: Negative for adenopathy. Does not bruise/bleed easily.  Psychiatric/Behavioral: Negative for confusion.    Blood pressure 136/64, pulse 83, temperature 97.6 F (36.4 C), temperature source Oral, resp. rate 18, height 5' 3.5" (1.613 m), weight 139 lb (63.05 kg).  Physical Exam Physical Exam  Constitutional: She is oriented to person, place, and time. She appears well-developed and well-nourished. No distress.  HENT:  Head: Normocephalic and atraumatic.  Eyes: Conjunctivae normal and EOM are normal. Pupils are equal, round, and reactive to light. Left eye exhibits no discharge. No scleral icterus.  Neck: Neck supple. No JVD present. No tracheal deviation present. No thyromegaly present.  Cardiovascular: Normal rate, regular rhythm, normal heart sounds and intact distal pulses.   No murmur heard. Pulmonary/Chest: Effort normal and breath sounds normal. No respiratory distress. She has no wheezes. She has no rales. She exhibits no tenderness.       Right  breast reveals a 5 cm mass in the upper outer quadrant. This is mobile. No overlying skin change. Nipple and areolar complex normal. Right axilla reveals no palpable mass. Left breast reveals a well-healed transverse scar lower inner quadrant and well-healed left axilla scar. No masses.  Musculoskeletal: She exhibits no edema and no tenderness.  Lymphadenopathy:    She has no cervical adenopathy.  Neurological: She is alert and oriented to person, place, and time. She exhibits normal muscle tone. Coordination normal.  Skin: Skin is warm. No rash noted. She is not diaphoretic. No erythema. No pallor.  Psychiatric: She has a normal mood and affect. Her behavior is normal. Judgment and thought content normal.    Data Reviewed Recent ultrasound right breast. Cancer center  notes. My old records.  Assessment    Locally advanced invasive mammary carcinoma, probably ductal phenotype, right breast, upper outer quadrant, 5 cm tumor, receptor positive, HER-2-negative. Clinical stage T3, N1, stage IIIa.  The tumor itself may or may not be responding to antiestrogen therapy. The lymph nodes do appear to be smaller.  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 No evidence of recurrence 10 years following breast conservation surgery and adjuvant radiation therapy in [place]  Hypertension  Heart murmur  Glaucoma  Recent retinal vein occlusion  Negative family history for breast cancer    Plan    I discussed my findings on physical exam and the ultrasound report with her. She is still interested in getting neoadjuvant antiestrogen therapy a trial, and I think that is certainly reasonable without any downside. She is aware that she may or may not respond. If she does not respond then she will need to have  a mastectomy as she is not a candidate for lumpectomy at this point in time due to the large tumor related to the smaller volume of her breast.  She will establish herself with one of the other medical oncologists.  She will get a repeat ultrasound of her right breast in 6-7 weeks, and that will be arranged by the cancer center  Return to see me in 8-9 weeks.       Angelia Mould. Derrell Lolling, M.D., [facility] General and Minimally invasive Surgery Breast and Colorectal Surgery Office:   [phone] Pager:   [phone]  11/23/2012, 9:30 AM

## 2012-11-23 NOTE — Patient Instructions (Signed)
Your physical exam of the right breast today shows the tumor to be about 5 cm in size, no bigger but possibly no smaller than it was 2 months ago. The ultrasound shows the tumor to still be 5 cm in greatest dimension, but the lymph nodes under your right arm are smaller, perhaps half is large as they were 2 months ago. I suspect that you are slowly responding to the antiestrogen therapy.  The tumor is still too large to do a lumpectomy. This is not surprising, since response to antiestrogen therapy  Is usually  slow and steady.  Be sure to establish yourself with one of the other medical oncologists.  Be sure to get another ultrasound of your right breast in 6-8 weeks.  Return to see Dr. Derrell Lolling in approximately 9 weeks.

## 2012-11-23 NOTE — Telephone Encounter (Signed)
Spoke to pt concerning f/u appt.  Gave pt f/u appt with Dr. Welton Flakes for 12/27/12 at 1:00 for labs and 1:30 to f/u with Dr. Welton Flakes.  Informed pt that Dr. Welton Flakes will order breast US at time of f/u appt.  Received verbal understanding.  Pt denies further needs at this time.  Encourage pt to call with needs.  Received verbal understanding.  Contact information given.

## 2012-11-27 ENCOUNTER — Encounter: Payer: Self-pay | Admitting: *Deleted

## 2012-11-27 ENCOUNTER — Encounter: Payer: Self-pay | Admitting: Oncology

## 2012-12-10 ENCOUNTER — Other Ambulatory Visit: Payer: Self-pay | Admitting: *Deleted

## 2012-12-10 DIAGNOSIS — C50419 Malignant neoplasm of upper-outer quadrant of unspecified female breast: Secondary | ICD-10-CM

## 2012-12-27 ENCOUNTER — Ambulatory Visit (HOSPITAL_BASED_OUTPATIENT_CLINIC_OR_DEPARTMENT_OTHER): Payer: Medicare Other | Admitting: Oncology

## 2012-12-27 ENCOUNTER — Telehealth: Payer: Self-pay | Admitting: Oncology

## 2012-12-27 ENCOUNTER — Encounter: Payer: Self-pay | Admitting: Oncology

## 2012-12-27 ENCOUNTER — Other Ambulatory Visit (HOSPITAL_BASED_OUTPATIENT_CLINIC_OR_DEPARTMENT_OTHER): Payer: Medicare Other | Admitting: Lab

## 2012-12-27 VITALS — BP 105/65 | HR 83 | Temp 98.2°F | Resp 20 | Ht 63.5 in | Wt 133.9 lb

## 2012-12-27 DIAGNOSIS — C50419 Malignant neoplasm of upper-outer quadrant of unspecified female breast: Secondary | ICD-10-CM

## 2012-12-27 DIAGNOSIS — Z170 Estrogen receptor positive status [ER+]: Secondary | ICD-10-CM

## 2012-12-27 DIAGNOSIS — C773 Secondary and unspecified malignant neoplasm of axilla and upper limb lymph nodes: Secondary | ICD-10-CM

## 2012-12-27 DIAGNOSIS — C50911 Malignant neoplasm of unspecified site of right female breast: Secondary | ICD-10-CM

## 2012-12-27 DIAGNOSIS — C50919 Malignant neoplasm of unspecified site of unspecified female breast: Secondary | ICD-10-CM | POA: Insufficient documentation

## 2012-12-27 DIAGNOSIS — Z87898 Personal history of other specified conditions: Secondary | ICD-10-CM

## 2012-12-27 LAB — CBC WITH DIFFERENTIAL/PLATELET
BASO%: 0.5 % (ref 0.0–2.0)
Basophils Absolute: 0 10*3/uL (ref 0.0–0.1)
EOS%: 1 % (ref 0.0–7.0)
Eosinophils Absolute: 0.1 10*3/uL (ref 0.0–0.5)
HCT: 36.5 % (ref 34.8–46.6)
HGB: 12.6 g/dL (ref 11.6–15.9)
LYMPH%: 41.2 % (ref 14.0–49.7)
MCH: 32.1 pg (ref 25.1–34.0)
MCHC: 34.6 g/dL (ref 31.5–36.0)
MCV: 92.7 fL (ref 79.5–101.0)
MONO#: 0.3 10*3/uL (ref 0.1–0.9)
MONO%: 4.9 % (ref 0.0–14.0)
NEUT#: 3.2 10*3/uL (ref 1.5–6.5)
NEUT%: 52.4 % (ref 38.4–76.8)
Platelets: 198 10*3/uL (ref 145–400)
RBC: 3.94 10*6/uL (ref 3.70–5.45)
RDW: 12.2 % (ref 11.2–14.5)
WBC: 6.2 10*3/uL (ref 3.9–10.3)
lymph#: 2.5 10*3/uL (ref 0.9–3.3)

## 2012-12-27 LAB — COMPREHENSIVE METABOLIC PANEL (CC13)
ALT: 13 U/L (ref 0–55)
AST: 17 U/L (ref 5–34)
Albumin: 3.7 g/dL (ref 3.5–5.0)
Alkaline Phosphatase: 68 U/L (ref 40–150)
BUN: 14.2 mg/dL (ref 7.0–26.0)
CO2: 31 mEq/L — ABNORMAL HIGH (ref 22–29)
Calcium: 9.6 mg/dL (ref 8.4–10.4)
Chloride: 104 mEq/L (ref 98–107)
Creatinine: 0.8 mg/dL (ref 0.6–1.1)
Glucose: 111 mg/dl — ABNORMAL HIGH (ref 70–99)
Potassium: 3.9 mEq/L (ref 3.5–5.1)
Sodium: 140 mEq/L (ref 136–145)
Total Bilirubin: 0.63 mg/dL (ref 0.20–1.20)
Total Protein: 6.5 g/dL (ref 6.4–8.3)

## 2012-12-27 NOTE — Patient Instructions (Addendum)
Continue letrozole 2.5 mg daily  We will do a schedule MRI of breasts in May 2014  I will see you back after the MRI

## 2012-12-27 NOTE — Telephone Encounter (Signed)
gve the pt her may 2014 appt calendar along with the mri breast appt at [facility] .

## 2012-12-27 NOTE — Progress Notes (Signed)
OFFICE PROGRESS NOTE  CC Dr. Maurice Small Dr. Claud Kelp Dr. 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 Patient had a lymph node enlarged there was biopsied and was consistent with metastatic ductal carcinoma. Diagnosed October 2013.  PRIOR THERAPY:  #1 patient underwent a screening mammogram in September 2013 that showed a right breast mass with associated calcifications. On MRI the mass measured 4.7 x 3.6 x 5.2 cm lymph node measuring 1.5 x 1.0 x 2.0 cm. Patient had a needle core biopsy performed that showed invasive mammary carcinoma ER positive PR positive HER-2/neu negative with Ki-67 elevated at 66%. The lymph node was biopsied and was consistent with metastatic ductal carcinoma.  #2 patient was seen in the multidisciplinary breast clinic for discussion of treatment options. She had a Oncotype DX testing performed on the biopsied tissue her recurrence score was 28 giving her 18% risk of distant recurrence with antiestrogen therapy. She was in the intermediate risk category. Patient was enrolled on neoadjuvant antiestrogen treatment clinical trial.  #3 she is currently receiving letrozole 2.5 mg starting in November 2013. She had ultrasound of the right breast performed that showed reduction in the size of the lymph nodes.  #4 patient also has prior history of left lumpectomy and radiation for DCIS treated in 2003. This tumor was apparently ER and PR negative so she did not receive adjuvant tamoxifen.  CURRENT THERAPY: Letrozole 2.5 mg daily.  INTERVAL HISTORY: Mary Young 72 y.o. female returns for followup visit today. Overall she's doing well she's tolerating the letrozole without any significant complaints. She denies any fevers chills night sweats headaches shortness of breath chest pains palpitations she occasionally does have aches and pains but  nothing consistent. Remainder of the 10 point review of systems is negative.  MEDICAL HISTORY: Past Medical History  Diagnosis Date  . Breast cancer   . Hypertension   . Glaucoma   . Central retinal vein occlusion   . Asthma   . Joint pain     ALLERGIES:  is allergic to timoptic; percocet; precipitated sulfur; and timolol.  MEDICATIONS:  Current Outpatient Prescriptions  Medication Sig Dispense Refill  . aspirin 81 MG tablet Take 81 mg by mouth daily.      . B Complex-C (SUPER B COMPLEX PO) Take by mouth daily.      . brimonidine (ALPHAGAN) 0.15 % ophthalmic solution       . Calcium-Vitamin D-Vitamin K 500-100-40 MG-UNT-MCG CHEW Chew by mouth daily. 2 po daily      . dorzolamide (TRUSOPT) 2 % ophthalmic solution       . letrozole (FEMARA) 2.5 MG tablet Take 1 tablet (2.5 mg total) by mouth daily.  30 tablet  11  . lisinopril (PRINIVIL,ZESTRIL) 10 MG tablet       . Multiple Vitamin (MULTIVITAMIN) tablet Take 1 tablet by mouth daily.      . simvastatin (ZOCOR) 20 MG tablet       . TRAVATAN Z 0.004 % SOLN ophthalmic solution       . vitamin E 400 UNIT capsule Take 400 Units by mouth daily.       No current facility-administered medications for this visit.    SURGICAL HISTORY:  Past Surgical History  Procedure Laterality Date  . Abdominal hysterectomy    . Breast lumpectomy  2003  . Arthroscopy knee w/ drilling  2005/2008    Left  knee/Right knee  . Tubal ligation  1983    REVIEW OF SYSTEMS:  Pertinent items are noted in HPI.   HEALTH MAINTENANCE:   PHYSICAL EXAMINATION: Blood pressure 105/65, pulse 83, temperature 98.2 F (36.8 C), temperature source Oral, resp. rate 20, height 5' 3.5" (1.613 m), weight 133 lb 14.4 oz (60.737 kg). Body mass index is 23.34 kg/(m^2). ECOG PERFORMANCE STATUS: 0 - Asymptomatic   General appearance: alert, cooperative and appears stated age Lymph nodes: Cervical, supraclavicular, and axillary nodes normal. Resp: clear to auscultation  bilaterally Back: symmetric, no curvature. ROM normal. No CVA tenderness. Cardio: regular rate and rhythm GI: soft, non-tender; bowel sounds normal; no masses,  no organomegaly Extremities: extremities normal, atraumatic, no cyanosis or edema Neurologic: Grossly normal Left breast: Well-healed surgical scar in the inner lower quadrant. Right breast reveals a palpable mass at the 10:00 position measuring about 4-5 cm. No nipple discharge no other masses  LABORATORY DATA: Lab Results  Component Value Date   WBC 6.2 12/27/2012   HGB 12.6 12/27/2012   HCT 36.5 12/27/2012   MCV 92.7 12/27/2012   PLT 198 12/27/2012      Chemistry      Component Value Date/Time   NA 140 12/27/2012 1318   NA 144 01/25/2007 1220   K 3.9 12/27/2012 1318   K 4.3 01/25/2007 1220   CL 104 12/27/2012 1318   CL 103 01/25/2007 1220   CO2 31* 12/27/2012 1318   CO2 34* 01/25/2007 1220   BUN 14.2 12/27/2012 1318   BUN 13 01/25/2007 1220   CREATININE 0.8 12/27/2012 1318   CREATININE 0.6 01/25/2007 1220      Component Value Date/Time   CALCIUM 9.6 12/27/2012 1318   CALCIUM 9.9 01/25/2007 1220   ALKPHOS 68 12/27/2012 1318   ALKPHOS 85 01/25/2007 1220   AST 17 12/27/2012 1318   AST 22 01/25/2007 1220   ALT 13 12/27/2012 1318   ALT 25 01/25/2007 1220   BILITOT 0.63 12/27/2012 1318   BILITOT 0.8 01/25/2007 1220       RADIOGRAPHIC STUDIES:  No results found.  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 The tumor was ER positive PR positive HER-2/neu negative with an elevated Ki-67 of 66%. She is on neoadjuvant antiestrogen therapy consisting of letrozole 2.5 mg. She had an Oncotype DX testing performed on her biopsy tissue that put her in the intermediate risk category. She is on a clinical trial. She is tolerating letrozole well. Most recent ultrasound did show decrease in the size of the axillary lymph node.  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   PLAN:   #1 patient will continue the letrozole daily.  #2 in May 2014 we will get MRI of the breasts performed for evaluation a response to letrozole neoadjuvant daily.  #3 I will see her back after the MRI.  All questions were answered. The patient knows to call the clinic with any problems, questions or concerns. We can certainly see the patient much sooner if necessary.  I spent 40 minutes counseling the patient face to face. The total time spent in the appointment was 40 minutes.    Drue Second, MD Medical/Oncology [facility] [phone] (beeper) [phone] (Office)  12/27/2012, 2:31 PM

## 2013-01-01 ENCOUNTER — Other Ambulatory Visit: Payer: Self-pay

## 2013-01-14 ENCOUNTER — Other Ambulatory Visit: Payer: Self-pay | Admitting: Ophthalmology

## 2013-01-25 ENCOUNTER — Ambulatory Visit (INDEPENDENT_AMBULATORY_CARE_PROVIDER_SITE_OTHER): Payer: Medicare Other | Admitting: General Surgery

## 2013-01-25 ENCOUNTER — Encounter (INDEPENDENT_AMBULATORY_CARE_PROVIDER_SITE_OTHER): Payer: Self-pay | Admitting: General Surgery

## 2013-01-25 VITALS — BP 110/78 | HR 66 | Temp 98.0°F | Resp 18 | Ht 63.5 in | Wt 132.0 lb

## 2013-01-25 DIAGNOSIS — C50419 Malignant neoplasm of upper-outer quadrant of unspecified female breast: Secondary | ICD-10-CM

## 2013-01-25 DIAGNOSIS — C50411 Malignant neoplasm of upper-outer quadrant of right female breast: Secondary | ICD-10-CM

## 2013-01-25 NOTE — Patient Instructions (Signed)
Your physical exam today shows that the tumor in your right breast is 4-5 cm in size. No larger, perhaps slightly smaller. I do not feel any lymph nodes in your neck or under your arms.  For now, I agree that you should continue the neoadjuvant antiestrogen therapy.  You will get a breast MRI on May 6, see Dr. Welton Flakes on May 8.  Return to see Dr. Derrell Lolling in May after you see Dr. Welton Flakes.

## 2013-01-25 NOTE — Progress Notes (Signed)
Patient ID: [name], female   DOB: 1941-02-12, 72 y.o.   MRN: [id number] History:  Mary Young is a 72 y.o. female. She returns for followup during her antiestrogen neoadjuvant  therapy for her right breast cancer.  This patient initially presented almost 5 months ago with a large tumor in her right breast, upper outer quadrant. Imaging studies showed a 5.2 cm tumor and a 2.0 cm right axillary lymph node. 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  She has a past history of left partial mastectomy and sentinel node biopsy and radiation therapy in [facility]. His tumor might have been microinvasive, node negative disease. She has no evidence of recurrence there.  Family history is negative for breast cancer.  He is currently on neoadjuvant letrozole. She saw Dr. Donnie Young on December 31.  She has now reestablished herself with Dr. Drue Young.Marland Kitchen Ultrasound of the right breast performed in January showed that the lymph nodes in the right axilla are smaller, down to 6 mm. The tumor itself is still 5 cm in greatest dimension.  We talked a long time about treatment options. We talked about slower response to antiestrogen therapy, hopefully allowing ultimately a lumpectomy. She also wanted to talk a little bit about unilateral or bilateral mastectomy. At this point in time she still wants to give the antiestrogen therapy a try which I think is reasonable.  ROS:: 10 system review of systems is negative except as described above. She is trying to lose weight and states she has lost about 16 pounds and feels better.   Exam: Patient looks well. No distress. Husband is with her Neck reveals no adenopathy or mass. Lungs are clear to auscultation bilaterally Heart regular rate and rhythm. No ectopy. Soft systolic murmur Breast: Right breast reveals a 4-5 cm palpable mass in  the upper outer quadrant which is mobile. The skin is healthy. No axillary mass. Left breast exam and left axilla are unremarkable.    Assessment:  Locally advanced invasive mammary carcinoma, probably ductal phenotype, right breast, upper outer quadrant, 5 cm tumor, receptor positive, HER-2-negative. Clinical stage T3, N1, stage IIIa.  The tumor itself may or may not be responding to antiestrogen therapy. The lymph nodes do appear to be smaller.   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 No evidence of recurrence 10 years following breast conservation surgery and adjuvant radiation therapy in    Hypertension  Heart murmur  Glaucoma  Recent retinal vein occlusion  Negative family history for breast cancer    Plan:  We had a long discussion. I told her I was uncertain whether the tumor was responding to antiestrogen therapy or not. I told her that at this point in time she is not a candidate for lumpectomy. She is still hoping that she'll respond and become a candidate. She knows that possibly this will not be  the case.  She is aware that she will need axillary lymph node dissection and because of her node-positive disease she may need radiation therapy regardless of whether she has a mastectomy or lumpectomy.  Repeat bilateral breast MRI on May 6. Upon Dr. Drue Young May 8 Return to see me in mid May, 2014.   Mary Young, M.D., [facility] General and Minimally invasive Surgery Breast and Colorectal Surgery Office:   [phone] Pager:   [phone]

## 2013-03-22 ENCOUNTER — Ambulatory Visit (HOSPITAL_COMMUNITY)
Admission: RE | Admit: 2013-03-22 | Discharge: 2013-03-22 | Disposition: A | Discharge status: Home / Self Care | Payer: Medicare Other | Source: Ambulatory Visit | Attending: Oncology | Admitting: Oncology

## 2013-03-22 ENCOUNTER — Other Ambulatory Visit (HOSPITAL_BASED_OUTPATIENT_CLINIC_OR_DEPARTMENT_OTHER): Payer: Medicare Other

## 2013-03-22 ENCOUNTER — Other Ambulatory Visit: Payer: Self-pay | Admitting: Oncology

## 2013-03-22 DIAGNOSIS — C773 Secondary and unspecified malignant neoplasm of axilla and upper limb lymph nodes: Secondary | ICD-10-CM

## 2013-03-22 DIAGNOSIS — C50911 Malignant neoplasm of unspecified site of right female breast: Secondary | ICD-10-CM

## 2013-03-22 DIAGNOSIS — Z923 Personal history of irradiation: Secondary | ICD-10-CM | POA: Insufficient documentation

## 2013-03-22 DIAGNOSIS — C50919 Malignant neoplasm of unspecified site of unspecified female breast: Secondary | ICD-10-CM | POA: Insufficient documentation

## 2013-03-22 DIAGNOSIS — C50419 Malignant neoplasm of upper-outer quadrant of unspecified female breast: Secondary | ICD-10-CM

## 2013-03-22 LAB — CBC WITH DIFFERENTIAL/PLATELET
BASO%: 0.6 % (ref 0.0–2.0)
Basophils Absolute: 0 10*3/uL (ref 0.0–0.1)
EOS%: 3.8 % (ref 0.0–7.0)
Eosinophils Absolute: 0.2 10*3/uL (ref 0.0–0.5)
HCT: 40.3 % (ref 34.8–46.6)
HGB: 13.7 g/dL (ref 11.6–15.9)
LYMPH%: 42.6 % (ref 14.0–49.7)
MCH: 31.4 pg (ref 25.1–34.0)
MCHC: 33.9 g/dL (ref 31.5–36.0)
MCV: 92.8 fL (ref 79.5–101.0)
MONO#: 0.3 10*3/uL (ref 0.1–0.9)
MONO%: 6.2 % (ref 0.0–14.0)
NEUT#: 2.4 10*3/uL (ref 1.5–6.5)
NEUT%: 46.8 % (ref 38.4–76.8)
Platelets: 214 10*3/uL (ref 145–400)
RBC: 4.35 10*6/uL (ref 3.70–5.45)
RDW: 12.7 % (ref 11.2–14.5)
WBC: 5.1 10*3/uL (ref 3.9–10.3)
lymph#: 2.2 10*3/uL (ref 0.9–3.3)

## 2013-03-22 LAB — COMPREHENSIVE METABOLIC PANEL (CC13)
ALT: 16 U/L (ref 0–55)
AST: 18 U/L (ref 5–34)
Albumin: 3.5 g/dL (ref 3.5–5.0)
Alkaline Phosphatase: 70 U/L (ref 40–150)
BUN: 12.4 mg/dL (ref 7.0–26.0)
CO2: 29 mEq/L (ref 22–29)
Calcium: 9 mg/dL (ref 8.4–10.4)
Chloride: 107 mEq/L (ref 98–107)
Creatinine: 0.7 mg/dL (ref 0.6–1.1)
Glucose: 96 mg/dl (ref 70–99)
Potassium: 4 mEq/L (ref 3.5–5.1)
Sodium: 144 mEq/L (ref 136–145)
Total Bilirubin: 0.72 mg/dL (ref 0.20–1.20)
Total Protein: 6.6 g/dL (ref 6.4–8.3)

## 2013-03-22 MED ORDER — GADOBENATE DIMEGLUMINE 529 MG/ML IV SOLN
12.0000 mL | Freq: Once | INTRAVENOUS | Status: AC | PRN
  Administered 2013-03-22: 12 mL via INTRAVENOUS

## 2013-03-23 ENCOUNTER — Telehealth: Payer: Self-pay | Admitting: Oncology

## 2013-03-23 ENCOUNTER — Telehealth: Payer: Self-pay | Admitting: *Deleted

## 2013-03-24 ENCOUNTER — Telehealth: Payer: Self-pay | Admitting: Oncology

## 2013-03-24 ENCOUNTER — Ambulatory Visit (HOSPITAL_BASED_OUTPATIENT_CLINIC_OR_DEPARTMENT_OTHER): Payer: Medicare Other | Admitting: Oncology

## 2013-03-24 ENCOUNTER — Ambulatory Visit: Payer: Medicare Other | Admitting: Oncology

## 2013-03-24 ENCOUNTER — Other Ambulatory Visit: Payer: Medicare Other | Admitting: Lab

## 2013-03-24 VITALS — BP 158/74 | HR 64 | Temp 97.8°F | Resp 20 | Ht 63.5 in | Wt 130.3 lb

## 2013-03-24 DIAGNOSIS — C50911 Malignant neoplasm of unspecified site of right female breast: Secondary | ICD-10-CM

## 2013-03-24 DIAGNOSIS — C50419 Malignant neoplasm of upper-outer quadrant of unspecified female breast: Secondary | ICD-10-CM

## 2013-03-24 DIAGNOSIS — Z87898 Personal history of other specified conditions: Secondary | ICD-10-CM

## 2013-03-24 DIAGNOSIS — C773 Secondary and unspecified malignant neoplasm of axilla and upper limb lymph nodes: Secondary | ICD-10-CM

## 2013-03-24 DIAGNOSIS — Z170 Estrogen receptor positive status [ER+]: Secondary | ICD-10-CM

## 2013-03-24 NOTE — Patient Instructions (Addendum)
Keep appointment with Dr. Derrell Lolling for surgery  I will see you back in 2 months

## 2013-03-24 NOTE — Progress Notes (Signed)
OFFICE PROGRESS NOTE  CC Dr. Maurice Small Dr. Claud Kelp Dr. 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 Patient had a lymph node enlarged there was biopsied and was consistent with metastatic ductal carcinoma. Diagnosed October 2013.  PRIOR THERAPY:  #1 patient underwent a screening mammogram in September 2013 that showed a right breast mass with associated calcifications. On MRI the mass measured 4.7 x 3.6 x 5.2 cm lymph node measuring 1.5 x 1.0 x 2.0 cm. Patient had a needle core biopsy performed that showed invasive mammary carcinoma ER positive PR positive HER-2/neu negative with Ki-67 elevated at 66%. The lymph node was biopsied and was consistent with metastatic ductal carcinoma.  #2 patient was seen in the multidisciplinary breast clinic for discussion of treatment options. She had a Oncotype DX testing performed on the biopsied tissue her recurrence score was 28 giving her 18% risk of distant recurrence with antiestrogen therapy. She was in the intermediate risk category. Patient was enrolled on neoadjuvant antiestrogen treatment clinical trial.  #3 she is currently receiving letrozole 2.5 mg starting in November 2013. She had ultrasound of the right breast performed that showed reduction in the size of the lymph nodes.  #4 patient also has prior history of left lumpectomy and radiation for DCIS treated in 2003. This tumor was apparently ER and PR negative so she did not receive adjuvant tamoxifen.  CURRENT THERAPY: Letrozole 2.5 mg daily.  INTERVAL HISTORY: Mary Young 72 y.o. female returns for followup visit today. Overall she's doing well she's tolerating the letrozole without any significant complaints. She denies any fevers chills night sweats headaches shortness of breath chest pains palpitations she occasionally does have aches and pains but  nothing consistent. Remainder of the 10 point review of systems is negative.  MEDICAL HISTORY: Past Medical History  Diagnosis Date  . Breast cancer   . Hypertension   . Glaucoma   . Central retinal vein occlusion   . Asthma   . Joint pain     ALLERGIES:  is allergic to timoptic; percocet; precipitated sulfur; and timolol.  MEDICATIONS:  Current Outpatient Prescriptions  Medication Sig Dispense Refill  . aspirin 81 MG tablet Take 81 mg by mouth daily.      . B Complex-C (SUPER B COMPLEX PO) Take by mouth daily.      . brimonidine (ALPHAGAN P) 0.1 % SOLN 2 drops. 2 drops daily.      . Calcium-Vitamin D-Vitamin K 500-100-40 MG-UNT-MCG CHEW Chew by mouth daily. 2 po daily      . letrozole (FEMARA) 2.5 MG tablet Take 1 tablet (2.5 mg total) by mouth daily.  30 tablet  11  . lisinopril (PRINIVIL,ZESTRIL) 10 MG tablet       . Multiple Vitamin (MULTIVITAMIN) tablet Take 1 tablet by mouth daily.      . simvastatin (ZOCOR) 20 MG tablet       . timolol (TIMOPTIC) 0.5 % ophthalmic solution Place 1 drop into both eyes 2 (two) times daily. Non preservative      . TRAVATAN Z 0.004 % SOLN ophthalmic solution       . vitamin E 400 UNIT capsule Take 400 Units by mouth daily.       No current facility-administered medications for this visit.    SURGICAL HISTORY:  Past Surgical History  Procedure Laterality Date  . Abdominal hysterectomy    . Breast  lumpectomy  2003  . Arthroscopy knee w/ drilling  2005/2008    Left knee/Right knee  . Tubal ligation  1983    REVIEW OF SYSTEMS:  Pertinent items are noted in HPI.   HEALTH MAINTENANCE:   PHYSICAL EXAMINATION: Blood pressure 158/74, pulse 64, temperature 97.8 F (36.6 C), temperature source Oral, resp. rate 20, height 5' 3.5" (1.613 m), weight 130 lb 4.8 oz (59.104 kg). Body mass index is 22.72 kg/(m^2). ECOG PERFORMANCE STATUS: 0 - Asymptomatic   General appearance: alert, cooperative and appears stated age Lymph nodes: Cervical,  supraclavicular, and axillary nodes normal. Resp: clear to auscultation bilaterally Back: symmetric, no curvature. ROM normal. No CVA tenderness. Cardio: regular rate and rhythm GI: soft, non-tender; bowel sounds normal; no masses,  no organomegaly Extremities: extremities normal, atraumatic, no cyanosis or edema Neurologic: Grossly normal Left breast: Well-healed surgical scar in the inner lower quadrant. Right breast reveals a palpable mass at the 10:00 position measuring about 4-5 cm. No nipple discharge no other masses  LABORATORY DATA: Lab Results  Component Value Date   WBC 5.1 03/22/2013   HGB 13.7 03/22/2013   HCT 40.3 03/22/2013   MCV 92.8 03/22/2013   PLT 214 03/22/2013      Chemistry      Component Value Date/Time   NA 144 03/22/2013 0811   NA 144 01/25/2007 1220   K 4.0 03/22/2013 0811   K 4.3 01/25/2007 1220   CL 107 03/22/2013 0811   CL 103 01/25/2007 1220   CO2 29 03/22/2013 0811   CO2 34* 01/25/2007 1220   BUN 12.4 03/22/2013 0811   BUN 13 01/25/2007 1220   CREATININE 0.7 03/22/2013 0811   CREATININE 0.6 01/25/2007 1220      Component Value Date/Time   CALCIUM 9.0 03/22/2013 0811   CALCIUM 9.9 01/25/2007 1220   ALKPHOS 70 03/22/2013 0811   ALKPHOS 85 01/25/2007 1220   AST 18 03/22/2013 0811   AST 22 01/25/2007 1220   ALT 16 03/22/2013 0811   ALT 25 01/25/2007 1220   BILITOT 0.72 03/22/2013 0811   BILITOT 0.8 01/25/2007 1220       RADIOGRAPHIC STUDIES:  No results found.  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 The tumor was ER positive PR positive HER-2/neu negative with an elevated Ki-67 of 66%. She is on neoadjuvant antiestrogen therapy consisting of letrozole 2.5 mg. She had an Oncotype DX testing performed on her biopsy tissue that put her in the intermediate risk category. She is on a clinical trial. She is tolerating letrozole well. Most recent ultrasound did show decrease in the  size of the axillary lymph node.  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   PLAN:   #1patient had MRI of the breasts performed that reveals relatively stable disease with minimal reduction. We discussed the situation today.  #2 I have recommended that they be seen by Dr. Derrell Lolling for discussion of definitive surgery.  #3 I will see her back in 2-3 months time for followup.  All questions were answered. The patient knows to call the clinic with any problems, questions or concerns. We can certainly see the patient much sooner if necessary.  I spent 25 minutes counseling the patient face to face. The total time spent in the appointment was 30 minutes.    Drue Second, MD Medical/Oncology [facility] [phone] (beeper) [phone] (Office)  03/24/2013, 11:01 AM

## 2013-03-24 NOTE — Telephone Encounter (Signed)
, °

## 2013-03-31 ENCOUNTER — Ambulatory Visit (INDEPENDENT_AMBULATORY_CARE_PROVIDER_SITE_OTHER): Payer: Medicare Other | Admitting: General Surgery

## 2013-03-31 ENCOUNTER — Encounter (INDEPENDENT_AMBULATORY_CARE_PROVIDER_SITE_OTHER): Payer: Self-pay | Admitting: General Surgery

## 2013-03-31 VITALS — BP 148/78 | HR 68 | Temp 98.2°F | Resp 18 | Ht 63.5 in | Wt 130.0 lb

## 2013-03-31 DIAGNOSIS — C50411 Malignant neoplasm of upper-outer quadrant of right female breast: Secondary | ICD-10-CM

## 2013-03-31 DIAGNOSIS — C50419 Malignant neoplasm of upper-outer quadrant of unspecified female breast: Secondary | ICD-10-CM

## 2013-03-31 NOTE — Patient Instructions (Signed)
The cancer in your right breast is only slightly smaller than it was. A recent MRI shows it to be at least 4.5 cm. The left breast looks normal on MRI.  Since you have not responded to the antiestrogen therapy, the next step is to proceed with a right modified radical mastectomy. You will probably be offered radiation therapy after the surgery. Dr. Welton Flakes is considering whether he will need chemotherapy or not.  If you desire, we will be happy to refer you to a plastic surgeon about reconstructive options. I suspect that any reconstruction would have to be delayed because of the need for radiation therapy to the chest wall. You have stated that you are not interested in that at this time.    Mastectomy, With or Without Reconstruction Mastectomy (removal of the breast) is a procedure most commonly used to treat cancer (tumor) of the breast. Different procedures are available for treatment. This depends on the stage of the tumor (abnormal growths). Discuss this with your caregiver, surgeon (a specialist for performing operations such as this), or oncologist (someone specialized in the treatment of cancer). With proper information, you can decide which treatment is best for you. Although the sound of the word cancer is frightening to all of [place], the new treatments and medications can be a source of reassurance and comfort. If there are things you are worried about, discuss them with your caregiver. He or she can help comfort you and your family. Some of the different procedures for treating breast cancer are:  Radical (extensive) mastectomy. This is an operation used to remove the entire breast, the muscles under the breast, and all of the glands (lymph nodes) under the arm. With all of the new treatments available for cancer of the breast, this procedure has become less common.  Modified radical mastectomy. This is a similar operation to the radical mastectomy described above. In the modified radical  mastectomy, the muscles of the chest wall are not removed unless one of the lessor muscles is removed. One of the lessor muscles may be removed to allow better removal of the lymph nodes. The axillary lymph nodes are also removed. Rarely, during an axillary node dissection nerves to this area are damaged. Radiation therapy is then often used to the area following this surgery.  A total mastectomy also known as a complete or simple mastectomy. It involves removal of only the breast. The lymph nodes and the muscles are left in place.  In a lumpectomy, the lump is removed from the breast. This is the simplest form of surgical treatment. A sentinel lymph node biopsy may also be done. Additional treatment may be required. RISKS AND COMPLICATIONS The main problems that follow removal of the breast include:  Infection (germs start growing in the wound). This can usually be treated with antibiotics (medications that kill germs).  Lymphedema. This means the arm on the side of the breast that was operated on swells because the lymph (tissue fluid) cannot follow the main channels back into the body. This only occurs when the lymph nodes have had to be removed under the arm.  There may be some areas of numbness to the upper arm and around the incision (cut by the surgeon) in the breast. This happens because of the cutting of or damage to some of the nerves in the area. This is most often unavoidable.  There may be difficultymoving the arm in a full range of motion (moving in all directions) following surgery. This usually  improves with time following use and exercise.  Recurrence of breast cancer may happen with the very best of surgery and follow up treatment. Sometimes small cancer cells that cannot be seen with the naked eye have already spread at the time of surgery. When this happens other treatment is available. This treatment may be radiation, medications or a combination of  both. RECONSTRUCTION Reconstruction of the breast may be done immediately if there is not going to be post-operative radiation. This surgery is done for cosmetic (improve appearance) purposes to improve the physical appearance after the operation. This may be done in two ways:  It can be done using a saline filled prosthetic (an artificial breast which is filled with salt water). Silicone breast implants are now re-approved by the FDA and are being commonly used.  Reconstruction can be done using the body's own muscle/fat/skin. Your caregiver will discuss your options with you. Depending upon your needs or choice, together you will be able to determine which procedure is best for you. Document Released: 07/29/2001 Document Revised: 01/26/2012 Document Reviewed: 03/21/2008 [facility] Patient Information 2013 [place], [place].     Total or Modified Radical Mastectomy  Care After Refer to this sheet in the next few weeks. These instructions provide you with information on caring for yourself after your procedure. Your caregiver may also give you more specific instructions. Your treatment has been planned according to current medical practices, but problems sometimes occur. Call your caregiver if you have any problems or questions after your procedure. ACTIVITY  Your caregiver will advise you when you may resume strenuous activities, driving, and sports.  After the drain(s) are removed, you may do light housework. Avoid heavy lifting, carrying, or pushing. You should not be lifting anything heavier than 5 lbs.  Take frequent rest periods. You may tire more easily than usual.  Always rest and elevate the arm affected by your surgery for a period of time equal to your activity time.  Continue doing the exercises given to you by the physical therapist/occupational therapist even after full range of motion has returned. The amount of time this takes will vary from person to person.  After normal  range of motion has returned, some stiffness and soreness may persist for 2-3 months. This is normal and will subside.  Begin sports or strenuous activities in moderation. This will give you a chance to rebuild your endurance. Continue to be cautious of heavy lifting or carrying (no more than 10 lbs.) with your affected arm.  You may return to work as recommended by your caregiver. NUTRITION  You may resume your normal diet.  Make sure you drink plenty of fluids (6-8 glasses a day).  Eat a well-balanced diet. Including daily portions of food from government recommended food groups:  Grains.  Vegetables.  Fruits.  Milk.  Meat & beans.  Oils. Visit [employer] for more information HYGIENE  You may wash your hair.  If your incision (cut from surgery) is closed, you may shower or tub bathe, unless instructed otherwise by your doctor. FEVER  If you feel feverish or have shaking chills, take your temperature. If your temperature is 102 F (38.9 C) or above, call your caregiver. The fever may mean there is an infection.  If you call early, infection can be treated with antibiotics and hospitalization may be avoided. PAIN CONTROL  Mild discomfort may occur.  You may need to take an over-the-counter pain medication or a medication prescribed by your caregiver.  Call your caregiver  if you experience increased pain. INCISION CARE  Check your incision daily for increased redness, drainage, swelling, or separation of skin.  Call your caregiver if any of the above are noted. ARM AND HAND CARE  If the lymph nodes under your arm were removed with a modified radical mastectomy, there may be a greater tendency for the arm to swell.  Try to avoid having blood pressures taken, blood drawn, or injections given in the affected arm. This is the arm on the same side as the surgery.  Use hand lotion to soften cuticles instead of cutting them to avoid cutting yourself.  Be  careful when shaving your under arms. Use an electric shaver if possible. You may use a deodorant after the incision has completely healed. Until then, clean under your arms with hydrogen peroxide.  Use reasonable precaution when cooking, sewing, and gardening to avoid burning or needle or thorn pricks.  Do not weigh your arm straight down with a package or your purse.  Follow the exercises and instructions given to you by the physical therapist/occupational therapist and your caregiver. FOLLOW-UP APPOINTMENT Call your caregiver for a follow-up appointment as directed. PROSTHESIS INFORMATION Wear your temporary prosthesis (artificial breast) until your caregiver gives you permission to purchase a permanent one. This will depend upon your rate of healing. We suggest you also wait until you are physically and emotionally ready to shop for one. The suitability depends on several individual factors. We do not endorse any particular prosthesis, but suggest you try several until you are satisfied with appearance and fit. A list of stores may be obtained from your local [employer] at www.cancer.org or 1-800-ACS-2345 (1-437-069-1525). A permanent prosthesis is medically necessary to restore balance. It is also income tax deductible. Be sure all receipts are marked "surgical". It is not essential to purchase a bra. You may sew a pocket into your regular bra. Note: Remember to take all of your medical insurance information with you when shopping for your prosthesis. SELECTING A PROSTHESIS FITTER You may want to ask the following questions when selecting a fitter:  What styles and brands of forms are carried in stock?  How long have the forms been on the market and have there been any problems with them?  Why would one form be better than another?  How long should a particular form last?  May I wear the form for a trial period without obligation?  Do the forms require a prosthetic bra? If  so, what is the price range? Must I always wear that style?  If alterations to the bra are necessary, can they be done at this location or be sent out?  Will I be charged for alterations?  Will I receive suggestions on how to alter my own wardrobe, if necessary?  Will you special order forms or bras if necessary?  Are fitters always available to meet my needs?  What kinds of garments should be worn for the fitting?  Are lounge wear, swim wear, and accessories available?  If I have insurance coverage or Medicare, will you suggest ways for processing the paper work?  Do you keep complete records so that mail reordering is possible?  How are warranty claims handled if I have a problem with the form? Document Released: 06/26/2004 Document Revised: 01/26/2012 Document Reviewed: 02/29/2008 [facility] Patient Information 2013 [place], [place].

## 2013-03-31 NOTE — Progress Notes (Addendum)
Patient ID: Mary Young, female   DOB: 09-Apr-1941, 72 y.o.   MRN: [id number]  Chief Complaint  Patient presents with  . Pre-op Exam    discuss breast surgery    HPI Mary Young is a 72 y.o. female.  She returns for discussion regarding surgical management of her right breast cancer.  . She returns for followup during her antiestrogen neoadjuvant therapy for her right breast cancer.  This patient initially presented almost 7 months ago with a large tumor in her right breast, upper outer quadrant. Imaging studies showed a 5.2 cm tumor and a 2.0 cm right axillary lymph node. 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  She has a past history of left partial mastectomy and sentinel node biopsy and radiation therapy in [facility]. His tumor might have been microinvasive, node negative disease. She has no evidence of recurrence there.   Family history is negative for breast cancer.   He is currently on neoadjuvant letrozole. She saw Dr. Welton Flakes on 5/8/143. MRI performed on 03/22/2013 shows minimal response of her right breast cancer, still 4.5 cm. She states that Dr. Welton Flakes has recommended proceeding with surgery at this time. She has discussed the possibility of chemotherapy but has not made a final decision on that.  We talked a long time about treatment options.  I told her that she was not a candidate for lumpectomy, and that we would need to proceed with a right modified radical mastectomy. She asked about contralateral mastectomy, and I did not encourage that, although I told her that was an option. I told her that she probably will be offered radiation therapy to the chest wall. I told her she is a candidate for reconstruction and offered to send her to a Engineer, petroleum. She declined. She knows that she probably would have to have reconstruction in a delayed  fashion.Marland Kitchen  Her husband is with her today. We talked a long time about right modified radical mastectomy, and she is ready to go ahead and schedule that surgery.   HPI  Past Medical History  Diagnosis Date  . Breast cancer   . Hypertension   . Glaucoma   . Central retinal vein occlusion   . Asthma   . Joint pain     Past Surgical History  Procedure Laterality Date  . Abdominal hysterectomy    . Breast lumpectomy  2003  . Arthroscopy knee w/ drilling  2005/2008    Left knee/Right knee  . Tubal ligation  1983    Family History  Problem Relation Age of Onset  . Cancer Brother     tumor near liver pancrese   half brother    Social History History  Substance Use Topics  . Smoking status: Never Smoker   . Smokeless tobacco: Not on file  . Alcohol Use: Yes    Allergies  Allergen Reactions  . Timoptic (Timolol Maleate) Itching    redness  . Percocet (Oxycodone-Acetaminophen)   . Precipitated Sulfur (Sulfur)   . Timolol Rash    Current Outpatient Prescriptions  Medication Sig Dispense Refill  . aspirin 81 MG tablet Take 81 mg by mouth daily.      . B Complex-C (SUPER B COMPLEX PO) Take by mouth daily.      . brimonidine (ALPHAGAN P) 0.1 % SOLN 2 drops. 2 drops daily.      . Calcium-Vitamin  D-Vitamin K 500-100-40 MG-UNT-MCG CHEW Chew by mouth daily. 2 po daily      . letrozole (FEMARA) 2.5 MG tablet Take 1 tablet (2.5 mg total) by mouth daily.  30 tablet  11  . lisinopril (PRINIVIL,ZESTRIL) 10 MG tablet       . Multiple Vitamin (MULTIVITAMIN) tablet Take 1 tablet by mouth daily.      . simvastatin (ZOCOR) 20 MG tablet       . timolol (TIMOPTIC) 0.5 % ophthalmic solution Place 1 drop into both eyes 2 (two) times daily. Non preservative      . TRAVATAN Z 0.004 % SOLN ophthalmic solution       . vitamin E 400 UNIT capsule Take 400 Units by mouth daily.       No current facility-administered medications for this visit.    Review of Systems Review of Systems   Constitutional: Negative for fever, chills and unexpected weight change.  HENT: Negative for hearing loss, congestion, sore throat, trouble swallowing and voice change.   Eyes: Negative for visual disturbance.  Respiratory: Negative for cough and wheezing.   Cardiovascular: Negative for chest pain, palpitations and leg swelling.  Gastrointestinal: Negative for nausea, vomiting, abdominal pain, diarrhea, constipation, blood in stool, abdominal distention and anal bleeding.  Genitourinary: Negative for hematuria, vaginal bleeding and difficulty urinating.  Musculoskeletal: Negative for arthralgias.  Skin: Negative for rash and wound.  Neurological: Negative for seizures, syncope and headaches.  Hematological: Negative for adenopathy. Does not bruise/bleed easily.  Psychiatric/Behavioral: Negative for confusion.    Blood pressure 148/78, pulse 68, temperature 98.2 F (36.8 C), resp. rate 18, height 5' 3.5" (1.613 m), weight 130 lb (58.968 kg).  Physical Exam Physical Exam  Constitutional: She is oriented to person, place, and time. She appears well-developed and well-nourished. No distress.  HENT:  Head: Normocephalic and atraumatic.  Nose: Nose normal.  Mouth/Throat: No oropharyngeal exudate.  Eyes: Conjunctivae and EOM are normal. Pupils are equal, round, and reactive to light. Left eye exhibits no discharge. No scleral icterus.  Neck: Neck supple. No JVD present. No tracheal deviation present. No thyromegaly present.  Cardiovascular: Normal rate, regular rhythm, normal heart sounds and intact distal pulses.   No murmur heard. Pulmonary/Chest: Effort normal and breath sounds normal. No respiratory distress. She has no wheezes. She has no rales. She exhibits no tenderness.  Large tumor in the central and upper outer quadrant right breast. 5 cm in diameter by my exam.  Skin appears healthy. Not fixed to the chest wall. Small but palpable right axillary lymph nodes. Left breast reveals old  scars no palpable mass or other skin change or active adenopathy.  Abdominal: Soft. Bowel sounds are normal. She exhibits no distension and no mass. There is no tenderness. There is no rebound and no guarding.  Pfannenstiel scar from her hysterectomy.  Musculoskeletal: She exhibits no edema and no tenderness.  Lymphadenopathy:    She has no cervical adenopathy.  Neurological: She is alert and oriented to person, place, and time. She exhibits normal muscle tone. Coordination normal.  Skin: Skin is warm. No rash noted. She is not diaphoretic. No erythema. No pallor.  Psychiatric: She has a normal mood and affect. Her behavior is normal. Judgment and thought content normal.    Data Reviewed All imaging studies. All notes from the cancer center. My old  records.  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 Clinical stage TIII, N1, stage IIIa.  The tumor has not responded well to  adjuvant antiestrogen therapy. 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 No evidence of recurrence 10 years following breast conservation surgery and adjuvant radiation therapy in [place].  Negative family history for breast cancer  Hypertension  Heart murmur  Recent retinal vein occlusion  Glaucoma     Plan    The patient will be scheduled for a right modified radical mastectomy.  We will hold off of the Port-A-Cath unless Dr. Welton Flakes decides that she's going to definitely offer her chemotherapy ADDENDUM(04/07/2013):  Dr. Welton Flakes advises that we hold off on the port until the final path is out.  I discussed the indications, details, techniques, and numerous risk of the surgery with her. Patient information has been given turgor and in written form as well. She  and her husband understand all of these issues. All their questions were answered. They agree with this plan.        Angelia Mould. Derrell Lolling, M.D., [facility] General and Minimally invasive Surgery Breast and Colorectal Surgery Office:   [phone] Pager:   [phone]  03/31/2013, 9:39 AM

## 2013-04-07 NOTE — Pre-Procedure Instructions (Signed)
Robertine Kipper Grau  04/07/2013   Your procedure is scheduled on: Friday, May 30th   Report to [facility] at  5:30 AM.             Bonita Quin will come through Entrance A, follow hallway/signs to [facility] and go to 3rd floor)   Call this number if you have problems the morning of surgery: [phone]   Remember:   Do not eat food or drink liquids after midnight Thursday.   Take these medicines the morning of surgery : Eye drops    Do not wear jewelry, make-up or nail polish.  Do not wear lotions, powders, or perfumes. You may NOT wear deodorant.  Do not shave underarms & legs 48 hours prior to surgery.    Do not bring valuables to the hospital.  Contacts, dentures or bridgework may not be worn into surgery.   Leave suitcase in the car. After surgery it may be brought to your room.  For patients admitted to the hospital, checkout time is 11:00 AM the day of discharge.   Name and phone number of your driver:    Special Instructions: Shower using CHG 2 nights before surgery and the night before surgery.  If you shower the day of surgery use CHG.  Use special wash - you have one bottle of CHG for all showers.  You should use approximately 1/3 of the bottle for each shower.             Stop taking all aspirin, anti-inflammatories, blood thinners, herbal medications 5 days prior to surgery.   Please read over the following fact sheets that you were given: Pain Booklet, Coughing and Deep Breathing, MRSA Information and Surgical Site Infection Prevention

## 2013-04-08 ENCOUNTER — Encounter (HOSPITAL_COMMUNITY)
Admission: RE | Admit: 2013-04-08 | Discharge: 2013-04-08 | Disposition: A | Discharge status: Home / Self Care | Payer: Medicare Other | Source: Ambulatory Visit | Attending: General Surgery | Admitting: General Surgery

## 2013-04-08 ENCOUNTER — Encounter (HOSPITAL_COMMUNITY)
Admission: RE | Admit: 2013-04-08 | Discharge: 2013-04-08 | Disposition: A | Discharge status: Home / Self Care | Payer: Medicare Other | Source: Ambulatory Visit | Attending: Anesthesiology | Admitting: Anesthesiology

## 2013-04-08 ENCOUNTER — Encounter (HOSPITAL_COMMUNITY): Payer: Self-pay | Admitting: Pharmacy Technician

## 2013-04-08 ENCOUNTER — Encounter (HOSPITAL_COMMUNITY): Payer: Self-pay

## 2013-04-08 ENCOUNTER — Telehealth (INDEPENDENT_AMBULATORY_CARE_PROVIDER_SITE_OTHER): Payer: Self-pay

## 2013-04-08 ENCOUNTER — Telehealth (INDEPENDENT_AMBULATORY_CARE_PROVIDER_SITE_OTHER): Payer: Self-pay | Admitting: *Deleted

## 2013-04-08 LAB — COMPREHENSIVE METABOLIC PANEL
ALT: 20 U/L (ref 0–35)
AST: 22 U/L (ref 0–37)
Albumin: 3.8 g/dL (ref 3.5–5.2)
Alkaline Phosphatase: 69 U/L (ref 39–117)
BUN: 13 mg/dL (ref 6–23)
CO2: 29 mEq/L (ref 19–32)
Calcium: 9.4 mg/dL (ref 8.4–10.5)
Chloride: 102 mEq/L (ref 96–112)
Creatinine, Ser: 0.58 mg/dL (ref 0.50–1.10)
GFR calc Af Amer: >90 mL/min (ref >90)
GFR calc non Af Amer: 90 mL/min — ABNORMAL LOW (ref >90)
Glucose, Bld: 91 mg/dL (ref 70–99)
Potassium: 3.8 mEq/L (ref 3.5–5.1)
Sodium: 140 mEq/L (ref 135–145)
Total Bilirubin: 0.6 mg/dL (ref 0.3–1.2)
Total Protein: 7 g/dL (ref 6.0–8.3)

## 2013-04-08 LAB — CBC WITH DIFFERENTIAL/PLATELET
Basophils Absolute: 0 10*3/uL (ref 0.0–0.1)
Basophils Relative: 0 % (ref 0–1)
Eosinophils Absolute: 0.1 10*3/uL (ref 0.0–0.7)
Eosinophils Relative: 3 % (ref 0–5)
HCT: 40.9 % (ref 36.0–46.0)
Hemoglobin: 14 g/dL (ref 12.0–15.0)
Lymphocytes Relative: 42 % (ref 12–46)
Lymphs Abs: 2.4 10*3/uL (ref 0.7–4.0)
MCH: 31.5 pg (ref 26.0–34.0)
MCHC: 34.2 g/dL (ref 30.0–36.0)
MCV: 92.1 fL (ref 78.0–100.0)
Monocytes Absolute: 0.3 10*3/uL (ref 0.1–1.0)
Monocytes Relative: 6 % (ref 3–12)
Neutro Abs: 2.8 10*3/uL (ref 1.7–7.7)
Neutrophils Relative %: 49 % (ref 43–77)
Platelets: 241 10*3/uL (ref 150–400)
RBC: 4.44 MIL/uL (ref 3.87–5.11)
RDW: 12.2 % (ref 11.5–15.5)
WBC: 5.6 10*3/uL (ref 4.0–10.5)

## 2013-04-08 LAB — URINALYSIS, ROUTINE W REFLEX MICROSCOPIC
Bilirubin Urine: NEGATIVE
Glucose, UA: NEGATIVE mg/dL
Hgb urine dipstick: NEGATIVE
Ketones, ur: NEGATIVE mg/dL
Leukocytes, UA: NEGATIVE
Nitrite: NEGATIVE
Protein, ur: NEGATIVE mg/dL
Specific Gravity, Urine: 1.01 (ref 1.005–1.030)
Urobilinogen, UA: 0.2 mg/dL (ref 0.0–1.0)
pH: 7.5 (ref 5.0–8.0)

## 2013-04-08 LAB — SURGICAL PCR SCREEN
MRSA, PCR: NEGATIVE
Staphylococcus aureus: NEGATIVE

## 2013-04-08 NOTE — Telephone Encounter (Signed)
I asked pt to stop fish oil as well.

## 2013-04-08 NOTE — Progress Notes (Signed)
Called heart doc (Dr. Rennis Golden) up in [place] for any notes on this patient...they will send info.  DA

## 2013-04-08 NOTE — Telephone Encounter (Signed)
I left a message for the Mary Young to call.  I have scheduled her to come in for a postop appointment to see Dr Carolynne Edouard.  Dr Derrell Lolling will be out of the office.  The appointment is June 6th at 11 am.

## 2013-04-08 NOTE — Telephone Encounter (Signed)
Patient is aware she needs to stop her Aspirin however she was told by pre-op to ask about her fish oil.  Should this be stopped as well

## 2013-04-08 NOTE — Telephone Encounter (Signed)
Patient returned my call and I let her know of the appointment.

## 2013-04-13 ENCOUNTER — Telehealth (INDEPENDENT_AMBULATORY_CARE_PROVIDER_SITE_OTHER): Payer: Self-pay

## 2013-04-13 NOTE — Telephone Encounter (Signed)
Mary Young at [facility] called requesting rx for mastectomy supplies and camesoles to be faxed to [phone]. I advised her request will be sent to Dr Derrell Lolling for review.

## 2013-04-14 ENCOUNTER — Other Ambulatory Visit: Payer: Self-pay | Admitting: Emergency Medicine

## 2013-04-14 ENCOUNTER — Telehealth: Payer: Self-pay | Admitting: Oncology

## 2013-04-14 MED ORDER — CEFAZOLIN SODIUM-DEXTROSE 2-3 GM-% IV SOLR
2.0000 g | INTRAVENOUS | Status: AC
  Administered 2013-04-15: 2 g via INTRAVENOUS
  Filled 2013-04-14: qty 50

## 2013-04-14 NOTE — Telephone Encounter (Signed)
Please bring these forms to me this morning and I will send him so we can fax them over to the appropriate vendor.  hmi

## 2013-04-14 NOTE — H&P (Signed)
[name]   MRN:  [id number]   Description: 72 year old female  Provider: Ernestene Mention, MD  Department: [facility]        Diagnoses    Cancer of upper-outer quadrant of female breast, right    -  Primary    174.4      Reason for Visit    Pre-op Exam    discuss breast surgery        Current Vitals - Last Recorded    BP Pulse Temp(Src) Resp Ht Wt    148/78 68 98.2 F (36.8 C) 18 5' 3.5" (1.613 m) 130 lb (58.968 kg)    BMI 22.66 kg/m2                 History and Physical    Ernestene Mention, MD    Status: Addendum                          HPI Mary Young is a 72 y.o. female.  She returns for discussion regarding surgical management of her right breast cancer.   . She returns for followup during her antiestrogen neoadjuvant therapy for her right breast cancer.   This patient initially presented almost 7 months ago with a large tumor in her right breast, upper outer quadrant. Imaging studies showed a 5.2 cm tumor and a 2.0 cm right axillary lymph node. 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   She has a past history of left partial mastectomy and sentinel node biopsy and radiation therapy in [facility]. His tumor might have been microinvasive, node negative disease. She has no evidence of recurrence there.    Family history is negative for breast cancer.    He is currently on neoadjuvant letrozole. She saw Dr. Welton Flakes on 5/8/143. MRI performed on 03/22/2013 shows minimal response of her right breast cancer, still 4.5 cm. She states that Dr. Welton Flakes has recommended proceeding with surgery at this time. She has discussed the possibility of chemotherapy but has not made a final decision on that.   We talked a long time about treatment options.  I told her that she was not a candidate for lumpectomy, and that we  would need to proceed with a right modified radical mastectomy. She asked about contralateral mastectomy, and I did not encourage that, although I told her that was an option. I told her that she probably will be offered radiation therapy to the chest wall. I told her she is a candidate for reconstruction and offered to send her to a Engineer, [facility]. She declined. She knows that she probably would have to have reconstruction in a delayed fashion.Marland Kitchen   Her husband is with her today. We talked a long time about right modified radical mastectomy, and she is ready to go ahead and schedule that surgery.       Past Medical History   Diagnosis  Date   .  Breast cancer     .  Hypertension     .  Glaucoma     .  Central retinal vein occlusion     .  Asthma     .  Joint pain           Past Surgical History   Procedure  Laterality  Date   .  Abdominal hysterectomy       .  Breast lumpectomy    2003   .  Arthroscopy knee w/ drilling    2005/2008       Left knee/Right knee   .  Tubal ligation    1983         Family History   Problem  Relation  Age of Onset   .  Cancer  Brother         tumor near liver pancrese   half brother        Social History History   Substance Use Topics   .  Smoking status:  Never Smoker    .  Smokeless tobacco:  Not on file   .  Alcohol Use:  Yes         Allergies   Allergen  Reactions   .  Timoptic (Timolol Maleate)  Itching       redness   .  Percocet (Oxycodone-Acetaminophen)     .  Precipitated Sulfur (Sulfur)     .  Timolol  Rash         Current Outpatient Prescriptions   Medication  Sig  Dispense  Refill   .  aspirin 81 MG tablet  Take 81 mg by mouth daily.         .  B Complex-C (SUPER B COMPLEX PO)  Take by mouth daily.         .  brimonidine (ALPHAGAN P) 0.1 % SOLN  2 drops. 2 drops daily.         .  Calcium-Vitamin D-Vitamin K 500-100-40 MG-UNT-MCG CHEW  Chew by mouth daily. 2 po daily         .  letrozole (FEMARA) 2.5 MG tablet   Take 1 tablet (2.5 mg total) by mouth daily.   30 tablet   11   .  lisinopril (PRINIVIL,ZESTRIL) 10 MG tablet           .  Multiple Vitamin (MULTIVITAMIN) tablet  Take 1 tablet by mouth daily.         .  simvastatin (ZOCOR) 20 MG tablet           .  timolol (TIMOPTIC) 0.5 % ophthalmic solution  Place 1 drop into both eyes 2 (two) times daily. Non preservative         .  TRAVATAN Z 0.004 % SOLN ophthalmic solution           .  vitamin E 400 UNIT capsule  Take 400 Units by mouth daily.             No current facility-administered medications for this visit.        Review of Systems  Constitutional: Negative for fever, chills and unexpected weight change.  HENT: Negative for hearing loss, congestion, sore throat, trouble swallowing and voice change.   Eyes: Negative for visual disturbance.  Respiratory: Negative for cough and wheezing.   Cardiovascular: Negative for chest pain, palpitations and leg swelling.  Gastrointestinal: Negative for nausea, vomiting, abdominal pain, diarrhea, constipation, blood in stool, abdominal distention and anal bleeding.  Genitourinary: Negative for hematuria, vaginal bleeding and difficulty urinating.  Musculoskeletal: Negative for arthralgias.  Skin: Negative for rash and wound.  Neurological: Negative for seizures, syncope and headaches.  Hematological: Negative for adenopathy. Does not bruise/bleed easily.  Psychiatric/Behavioral: Negative for confusion.      Blood pressure 148/78, pulse 68, temperature 98.2 F (36.8 C), resp. rate 18, height 5' 3.5" (1.613 m), weight 130 lb (58.968 kg).  Physical Exam   Constitutional: She is oriented to person, place, and time. She appears well-developed and well-nourished. No distress.  HENT:   Head: Normocephalic and atraumatic.   Nose: Nose normal.   Mouth/Throat: No oropharyngeal exudate.  Eyes: Conjunctivae and EOM are normal. Pupils are equal, round, and reactive to light. Left eye exhibits no  discharge. No scleral icterus.  Neck: Neck supple. No JVD present. No tracheal deviation present. No thyromegaly present.  Cardiovascular: Normal rate, regular rhythm, normal heart sounds and intact distal pulses.    No murmur heard. Pulmonary/Chest: Effort normal and breath sounds normal. No respiratory distress. She has no wheezes. She has no rales. She exhibits no tenderness.  Large tumor in the central and upper outer quadrant right breast. 5 cm in diameter by my exam.  Skin appears healthy. Not fixed to the chest wall. Small but palpable right axillary lymph nodes. Left breast reveals old scars no palpable mass or other skin change or active adenopathy.  Abdominal: Soft. Bowel sounds are normal. She exhibits no distension and no mass. There is no tenderness. There is no rebound and no guarding.  Pfannenstiel scar from her hysterectomy.  Musculoskeletal: She exhibits no edema and no tenderness.  Lymphadenopathy:    She has no cervical adenopathy.  Neurological: She is alert and oriented to person, place, and time. She exhibits normal muscle tone. Coordination normal.  Skin: Skin is warm. No rash noted. She is not diaphoretic. No erythema. No pallor.  Psychiatric: She has a normal mood and affect. Her behavior is normal. Judgment and thought content normal.      Data Reviewed All imaging studies. All notes from the cancer center. My old  records.   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 Clinical stage TIII, N1, stage IIIa.   The tumor has not responded well to  adjuvant antiestrogen therapy. I agree that we should proceed with definitive surgical intervention   Past history of node-negative microinvasive cancer left breast, lower inner quadrant. No evidence of recurrence 10 years following breast conservation surgery and adjuvant radiation  therapy in [place].   Negative family history for breast cancer   Hypertension   Heart murmur   Recent retinal vein occlusion   Glaucoma      Plan    The patient will be scheduled for a right modified radical mastectomy.   We will hold off of the Port-A-Cath unless Dr. Welton Flakes decides that she's going to definitely offer her chemotherapy ADDENDUM(04/07/2013):  Dr. Welton Flakes advises that we hold off on the port until the final path is out.   I discussed the indications, details, techniques, and numerous risk of the surgery with her. Patient information has been given turgor and in written form as well. She and her husband understand all of these issues. All their questions were answered. They agree with this plan.           Angelia Mould. Derrell Lolling, M.D., [facility] General and Minimally invasive Surgery Breast and Colorectal Surgery Office:   [phone] Pager:   [phone]

## 2013-04-15 ENCOUNTER — Encounter (HOSPITAL_COMMUNITY): Payer: Self-pay | Admitting: Anesthesiology

## 2013-04-15 ENCOUNTER — Encounter (HOSPITAL_COMMUNITY): Payer: Self-pay | Admitting: *Deleted

## 2013-04-15 ENCOUNTER — Ambulatory Visit (HOSPITAL_COMMUNITY)
Admission: RE | Admit: 2013-04-15 | Discharge: 2013-04-16 | Disposition: A | Discharge status: Home / Self Care | Payer: Medicare Other | Source: Ambulatory Visit | Attending: General Surgery | Admitting: General Surgery

## 2013-04-15 ENCOUNTER — Encounter (HOSPITAL_COMMUNITY)
Admission: RE | Disposition: A | Discharge status: Home / Self Care | Payer: Self-pay | Source: Ambulatory Visit | Attending: General Surgery

## 2013-04-15 ENCOUNTER — Ambulatory Visit (HOSPITAL_COMMUNITY): Payer: Medicare Other | Admitting: Anesthesiology

## 2013-04-15 DIAGNOSIS — I10 Essential (primary) hypertension: Secondary | ICD-10-CM | POA: Insufficient documentation

## 2013-04-15 DIAGNOSIS — C50419 Malignant neoplasm of upper-outer quadrant of unspecified female breast: Secondary | ICD-10-CM | POA: Insufficient documentation

## 2013-04-15 DIAGNOSIS — Z01810 Encounter for preprocedural cardiovascular examination: Secondary | ICD-10-CM | POA: Insufficient documentation

## 2013-04-15 DIAGNOSIS — C773 Secondary and unspecified malignant neoplasm of axilla and upper limb lymph nodes: Secondary | ICD-10-CM | POA: Insufficient documentation

## 2013-04-15 DIAGNOSIS — D0590 Unspecified type of carcinoma in situ of unspecified breast: Secondary | ICD-10-CM | POA: Insufficient documentation

## 2013-04-15 DIAGNOSIS — Z01818 Encounter for other preprocedural examination: Secondary | ICD-10-CM | POA: Insufficient documentation

## 2013-04-15 DIAGNOSIS — Z79899 Other long term (current) drug therapy: Secondary | ICD-10-CM | POA: Insufficient documentation

## 2013-04-15 DIAGNOSIS — C50411 Malignant neoplasm of upper-outer quadrant of right female breast: Secondary | ICD-10-CM

## 2013-04-15 DIAGNOSIS — Z01812 Encounter for preprocedural laboratory examination: Secondary | ICD-10-CM | POA: Insufficient documentation

## 2013-04-15 DIAGNOSIS — C50119 Malignant neoplasm of central portion of unspecified female breast: Secondary | ICD-10-CM | POA: Insufficient documentation

## 2013-04-15 HISTORY — PX: MASTECTOMY MODIFIED RADICAL: SHX5962

## 2013-04-15 SURGERY — MASTECTOMY, MODIFIED RADICAL
Anesthesia: General | Site: Breast | Laterality: Right | Wound class: Clean

## 2013-04-15 MED ORDER — DEXAMETHASONE SODIUM PHOSPHATE 10 MG/ML IJ SOLN
INTRAMUSCULAR | Status: DC | PRN
  Administered 2013-04-15: 8 mg via INTRAVENOUS

## 2013-04-15 MED ORDER — OXYCODONE-ACETAMINOPHEN 5-325 MG PO TABS: 1.0000 | ORAL_TABLET | ORAL | Status: DC | PRN

## 2013-04-15 MED ORDER — FENTANYL CITRATE 0.05 MG/ML IJ SOLN
INTRAMUSCULAR | Status: DC | PRN
  Administered 2013-04-15 (×4): 25 ug via INTRAVENOUS
  Administered 2013-04-15: 50 ug via INTRAVENOUS

## 2013-04-15 MED ORDER — SIMVASTATIN 20 MG PO TABS
20.0000 mg | ORAL_TABLET | ORAL | Status: DC
  Filled 2013-04-15: qty 1

## 2013-04-15 MED ORDER — 0.9 % SODIUM CHLORIDE (POUR BTL) OPTIME
TOPICAL | Status: DC | PRN
  Administered 2013-04-15 (×3): 1000 mL

## 2013-04-15 MED ORDER — MIDAZOLAM HCL 5 MG/5ML IJ SOLN
INTRAMUSCULAR | Status: DC | PRN
  Administered 2013-04-15 (×2): 1 mg via INTRAVENOUS

## 2013-04-15 MED ORDER — CHLORHEXIDINE GLUCONATE 4 % EX LIQD: 1.0000 "application " | Freq: Once | CUTANEOUS | Status: DC

## 2013-04-15 MED ORDER — HYDROMORPHONE HCL PF 1 MG/ML IJ SOLN
0.2500 mg | INTRAMUSCULAR | Status: DC | PRN
  Administered 2013-04-15 (×2): 0.5 mg via INTRAVENOUS

## 2013-04-15 MED ORDER — POTASSIUM CHLORIDE IN NACL 20-0.9 MEQ/L-% IV SOLN
INTRAVENOUS | Status: DC
  Administered 2013-04-15 – 2013-04-16 (×2): via INTRAVENOUS
  Filled 2013-04-15 (×4): qty 1000

## 2013-04-15 MED ORDER — ONDANSETRON HCL 4 MG PO TABS: 4.0000 mg | ORAL_TABLET | Freq: Four times a day (QID) | ORAL | Status: DC | PRN

## 2013-04-15 MED ORDER — PROPOFOL 10 MG/ML IV BOLUS
INTRAVENOUS | Status: DC | PRN
  Administered 2013-04-15: 150 mg via INTRAVENOUS

## 2013-04-15 MED ORDER — ASPIRIN EC 81 MG PO TBEC
81.0000 mg | DELAYED_RELEASE_TABLET | Freq: Every day | ORAL | Status: DC
  Administered 2013-04-15 – 2013-04-16 (×2): 81 mg via ORAL
  Filled 2013-04-15 (×2): qty 1

## 2013-04-15 MED ORDER — HEPARIN SODIUM (PORCINE) 5000 UNIT/ML IJ SOLN
5000.0000 [IU] | Freq: Three times a day (TID) | INTRAMUSCULAR | Status: DC
  Administered 2013-04-16: 5000 [IU] via SUBCUTANEOUS
  Filled 2013-04-15 (×3): qty 1

## 2013-04-15 MED ORDER — HYDROCODONE-ACETAMINOPHEN 5-325 MG PO TABS
1.0000 | ORAL_TABLET | ORAL | Status: DC | PRN
  Administered 2013-04-15: 1 via ORAL
  Filled 2013-04-15: qty 1

## 2013-04-15 MED ORDER — LACTATED RINGERS IV SOLN
INTRAVENOUS | Status: DC | PRN
  Administered 2013-04-15: 07:00:00 via INTRAVENOUS

## 2013-04-15 MED ORDER — LISINOPRIL 10 MG PO TABS
10.0000 mg | ORAL_TABLET | Freq: Every day | ORAL | Status: DC
  Administered 2013-04-15 – 2013-04-16 (×2): 10 mg via ORAL
  Filled 2013-04-15 (×2): qty 1

## 2013-04-15 MED ORDER — PROMETHAZINE HCL 25 MG/ML IJ SOLN: 6.2500 mg | INTRAMUSCULAR | Status: DC | PRN

## 2013-04-15 MED ORDER — ARTIFICIAL TEARS OP OINT
TOPICAL_OINTMENT | OPHTHALMIC | Status: DC | PRN
  Administered 2013-04-15: 1 via OPHTHALMIC

## 2013-04-15 MED ORDER — TIMOLOL MALEATE 0.5 % OP SOLN
1.0000 [drp] | Freq: Two times a day (BID) | OPHTHALMIC | Status: DC
  Administered 2013-04-15 – 2013-04-16 (×2): 1 [drp] via OPHTHALMIC

## 2013-04-15 MED ORDER — HYDROMORPHONE HCL PF 1 MG/ML IJ SOLN
INTRAMUSCULAR | Status: AC
  Filled 2013-04-15: qty 1

## 2013-04-15 MED ORDER — DORZOLAMIDE HCL 2 % OP SOLN
1.0000 [drp] | Freq: Two times a day (BID) | OPHTHALMIC | Status: DC
  Administered 2013-04-15 – 2013-04-16 (×2): 1 [drp] via OPHTHALMIC
  Filled 2013-04-15: qty 10

## 2013-04-15 MED ORDER — ONDANSETRON HCL 4 MG/2ML IJ SOLN
4.0000 mg | Freq: Four times a day (QID) | INTRAMUSCULAR | Status: DC | PRN
  Administered 2013-04-15: 4 mg via INTRAVENOUS
  Filled 2013-04-15: qty 2

## 2013-04-15 MED ORDER — LETROZOLE 2.5 MG PO TABS
2.5000 mg | ORAL_TABLET | Freq: Every day | ORAL | Status: DC
  Administered 2013-04-15 – 2013-04-16 (×2): 2.5 mg via ORAL
  Filled 2013-04-15 (×2): qty 1

## 2013-04-15 MED ORDER — LACTATED RINGERS IV SOLN
INTRAVENOUS | Status: DC | PRN
  Administered 2013-04-15: 09:00:00 via INTRAVENOUS

## 2013-04-15 MED ORDER — TRAVOPROST (BAK FREE) 0.004 % OP SOLN
1.0000 [drp] | Freq: Every day | OPHTHALMIC | Status: DC
  Administered 2013-04-15: 1 [drp] via OPHTHALMIC
  Filled 2013-04-15: qty 2.5

## 2013-04-15 MED ORDER — ONDANSETRON HCL 4 MG/2ML IJ SOLN
INTRAMUSCULAR | Status: DC | PRN
  Administered 2013-04-15 (×2): 4 mg via INTRAVENOUS

## 2013-04-15 MED ORDER — LIDOCAINE HCL (CARDIAC) 20 MG/ML IV SOLN
INTRAVENOUS | Status: DC | PRN
  Administered 2013-04-15: 100 mg via INTRAVENOUS

## 2013-04-15 MED ORDER — VITAMIN E 180 MG (400 UNIT) PO CAPS
400.0000 [IU] | ORAL_CAPSULE | Freq: Every day | ORAL | Status: DC
  Administered 2013-04-16: 400 [IU] via ORAL
  Filled 2013-04-15 (×2): qty 1

## 2013-04-15 MED ORDER — HYDROMORPHONE HCL PF 1 MG/ML IJ SOLN
0.5000 mg | INTRAMUSCULAR | Status: DC | PRN
  Administered 2013-04-15: 1 mg via INTRAVENOUS
  Filled 2013-04-15: qty 1

## 2013-04-15 MED ORDER — EPHEDRINE SULFATE 50 MG/ML IJ SOLN
INTRAMUSCULAR | Status: DC | PRN
  Administered 2013-04-15: 5 mg via INTRAVENOUS
  Administered 2013-04-15: 10 mg via INTRAVENOUS
  Administered 2013-04-15 (×2): 5 mg via INTRAVENOUS

## 2013-04-15 SURGICAL SUPPLY — 53 items
APPLIER CLIP 9.375 MED OPEN (MISCELLANEOUS) ×4
BENZOIN TINCTURE PRP APPL 2/3 (GAUZE/BANDAGES/DRESSINGS) ×2 IMPLANT
BINDER BREAST LRG (GAUZE/BANDAGES/DRESSINGS) ×2 IMPLANT
BINDER BREAST XLRG (GAUZE/BANDAGES/DRESSINGS) IMPLANT
CANISTER SUCTION 2500CC (MISCELLANEOUS) ×2 IMPLANT
CHLORAPREP W/TINT 26ML (MISCELLANEOUS) ×2 IMPLANT
CLIP APPLIE 9.375 MED OPEN (MISCELLANEOUS) ×2 IMPLANT
CLOTH BEACON ORANGE TIMEOUT ST (SAFETY) ×2 IMPLANT
CONT SPEC 4OZ CLIKSEAL STRL BL (MISCELLANEOUS) IMPLANT
COVER SURGICAL LIGHT HANDLE (MISCELLANEOUS) ×2 IMPLANT
DERMABOND ADHESIVE PROPEN (GAUZE/BANDAGES/DRESSINGS) ×1
DERMABOND ADVANCED .7 DNX6 (GAUZE/BANDAGES/DRESSINGS) ×1 IMPLANT
DRAIN CHANNEL 19F RND (DRAIN) ×4 IMPLANT
DRAPE LAPAROSCOPIC ABDOMINAL (DRAPES) ×2 IMPLANT
DRAPE PROXIMA HALF (DRAPES) ×2 IMPLANT
DRAPE UTILITY 15X26 W/TAPE STR (DRAPE) ×4 IMPLANT
DRSG ADAPTIC 3X8 NADH LF (GAUZE/BANDAGES/DRESSINGS) ×4 IMPLANT
DRSG PAD ABDOMINAL 8X10 ST (GAUZE/BANDAGES/DRESSINGS) ×4 IMPLANT
ELECT BLADE 4.0 EZ CLEAN MEGAD (MISCELLANEOUS) ×2
ELECT CAUTERY BLADE 6.4 (BLADE) ×2 IMPLANT
ELECT REM PT RETURN 9FT ADLT (ELECTROSURGICAL) ×2
ELECTRODE BLDE 4.0 EZ CLN MEGD (MISCELLANEOUS) ×1 IMPLANT
ELECTRODE REM PT RTRN 9FT ADLT (ELECTROSURGICAL) ×1 IMPLANT
EVACUATOR SILICONE 100CC (DRAIN) ×4 IMPLANT
GLOVE BIO SURGEON STRL SZ8 (GLOVE) ×2 IMPLANT
GLOVE BIOGEL PI IND STRL 6.5 (GLOVE) ×1 IMPLANT
GLOVE BIOGEL PI IND STRL 7.0 (GLOVE) ×1 IMPLANT
GLOVE BIOGEL PI IND STRL 8 (GLOVE) ×1 IMPLANT
GLOVE BIOGEL PI INDICATOR 6.5 (GLOVE) ×1
GLOVE BIOGEL PI INDICATOR 7.0 (GLOVE) ×1
GLOVE BIOGEL PI INDICATOR 8 (GLOVE) ×1
GLOVE ECLIPSE 8.0 STRL XLNG CF (GLOVE) ×2 IMPLANT
GLOVE EUDERMIC 7 POWDERFREE (GLOVE) ×2 IMPLANT
GLOVE SURG SS PI 7.0 STRL IVOR (GLOVE) ×2 IMPLANT
GOWN PREVENTION PLUS XLARGE (GOWN DISPOSABLE) ×2 IMPLANT
GOWN STRL NON-REIN LRG LVL3 (GOWN DISPOSABLE) ×6 IMPLANT
KIT BASIN OR (CUSTOM PROCEDURE TRAY) ×2 IMPLANT
KIT ROOM TURNOVER OR (KITS) ×2 IMPLANT
NS IRRIG 1000ML POUR BTL (IV SOLUTION) ×6 IMPLANT
PACK GENERAL/GYN (CUSTOM PROCEDURE TRAY) ×2 IMPLANT
PAD ARMBOARD 7.5X6 YLW CONV (MISCELLANEOUS) ×2 IMPLANT
SPECIMEN JAR X LARGE (MISCELLANEOUS) ×2 IMPLANT
SPONGE GAUZE 4X4 12PLY (GAUZE/BANDAGES/DRESSINGS) ×2 IMPLANT
SPONGE LAP 18X18 X RAY DECT (DISPOSABLE) ×2 IMPLANT
STAPLER VISISTAT 35W (STAPLE) ×2 IMPLANT
STRIP CLOSURE SKIN 1/2X4 (GAUZE/BANDAGES/DRESSINGS) ×2 IMPLANT
SUT ETHILON 3 0 FSL (SUTURE) ×6 IMPLANT
SUT MNCRL AB 4-0 PS2 18 (SUTURE) ×4 IMPLANT
SUT SILK 2 0 FS (SUTURE) ×2 IMPLANT
SUT VIC AB 3-0 SH 18 (SUTURE) ×4 IMPLANT
TAPE CLOTH SURG 6X10 WHT LF (GAUZE/BANDAGES/DRESSINGS) ×2 IMPLANT
TOWEL OR 17X24 6PK STRL BLUE (TOWEL DISPOSABLE) ×2 IMPLANT
TOWEL OR 17X26 10 PK STRL BLUE (TOWEL DISPOSABLE) ×2 IMPLANT

## 2013-04-15 NOTE — Op Note (Signed)
Patient Name:           Mary Young   Date of Surgery:        04/15/2013  Pre op Diagnosis:      Locally advanced, invasive mammary carcinoma right breast, central and upper outer quadrant, receptor positive, HER-2/neu negative. Clinical stage T3, N1, stage IIIa  Post op Diagnosis:    Same  Procedure:                 Right modified radical mastectomy  Surgeon:                     Angelia Mould. Derrell Lolling, M.D., FACS  Assistant:                      Avel Peace,  M.D., [facility]  Operative Indications:   This patient initially presented almost 7 months ago with a large tumor in her right breast, upper outer quadrant. Imaging studies showed a 5.2 cm tumor and a 2.0 cm right axillary lymph node. 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  She has a past history of left partial mastectomy and sentinel node biopsy and radiation therapy in [facility]. His tumor might have been microinvasive, node negative disease. She has no evidence of recurrence there.  Family history is negative for breast cancer.  He is currently on neoadjuvant letrozole. She saw Dr. Welton Flakes on 5/8/143. MRI performed on 03/22/2013 shows minimal response of her right breast cancer, still 4.5 cm. She states that Dr. Welton Flakes has recommended proceeding with surgery at this time. She has discussed the possibility of chemotherapy but has not made a final decision on that.  We talked a long time about treatment options. I told her that she was not a candidate for lumpectomy, and that we would need to proceed with a right modified radical mastectomy. She asked about contralateral mastectomy, and I did not encourage that, although I told her that was an option. I told her that she probably will be offered radiation therapy to the chest wall. I told her she is a candidate for reconstruction and offered to send her to a  Engineer, petroleum. She declined. She knows that she probably would have to have reconstruction in a delayed fashion.Marland Kitchen     Operative Findings:       The tumor was palpable, perhaps 4.5 cm., but dissected cleanly off of the chest wall without any evidence of chest wall invasion. We removed level one and level II lymph nodes as a complete axillary dissection, but there were no grossly abnormal lymph nodes. The thoracodorsal neurovascular bundle and the long thoracic nerve were identified and preserved.  Procedure in Detail:          Following the induction of general endotracheal anesthesia, the patient's right chest wall, axilla, neck, and upper arm were prepped and draped in a sterile fashion. Intravenous antibiotics were given. Surgical time out was performed. I marked a transverse elliptical incision, somewhat lower medially and somewhat higher laterally after planning the incision I made elliptical incision with a knife. Skin flaps were raised superiorly to the infraclavicular area, medially to the parasternal area, inferiorly to the anterior rectus sheath and laterally to latissimus dorsi muscle. Bleeders were controlled with electrocautery. The breast was then dissected off of the pectoralis major and pectoralis minor muscles with electrocautery. The lateral  skin margin was marked with a silk suture. We incised the clavipectoral fascia and dissected up into the axilla. Some venous tributaries were controlled with hemoclips and divided. We took the dissection up until we identified the axillary vein.  We slowly dissected all of the axillary tissue out, identifying and preserving the thoracodorsal neurovascular bundle and the long thoracic nerve. We removed all level I and level II lymph nodes with the specimen and sent it to the lab. The nerves were functioning at the completion of the case. Hemostasis was excellent. The wound was irrigated with over 2 L of saline. Two 19-French Blake drains were placed, one  up into the axilla and one across the skin flaps. These were brought out through separate stab incisions inferolaterally and sutured to the skin with nylon sutures.  The incision was closed with multiple interrupted subcutaneous sutures of 3-0 Vicryl and the skin was closed with running subcuticular suture of 4-0 Monocryl and Dermabond. After the Dermabond dried cushioning bandages were placed and a breast binder was placed. The patient tolerated the procedure well and was taken to recovery in stable. EBL 50 cc the counts correct. Complications none.     Angelia Mould. Derrell Lolling, M.D., FACS General and Minimally Invasive Surgery Breast and Colorectal Surgery  04/15/2013 9:17 AM

## 2013-04-15 NOTE — Anesthesia Preprocedure Evaluation (Addendum)
Anesthesia Evaluation  Patient identified by MRN, date of birth, ID band Patient awake    Reviewed: Allergy & Precautions, H&P , NPO status , Patient's Chart, lab work & pertinent test results  Airway Mallampati: II TM Distance: >3 FB Neck ROM: Full    Dental  (+) Teeth Intact and Dental Advisory Given   Pulmonary asthma ,    Pulmonary exam normal       Cardiovascular hypertension, Pt. on medications + Peripheral Vascular Disease     Neuro/Psych negative neurological ROS  negative psych ROS   GI/Hepatic negative GI ROS, Neg liver ROS,   Endo/Other  negative endocrine ROS  Renal/GU negative Renal ROS     Musculoskeletal   Abdominal   Peds  Hematology   Anesthesia Other Findings   Reproductive/Obstetrics                          Anesthesia Physical Anesthesia Plan  ASA: III  Anesthesia Plan: General   Post-op Pain Management:    Induction: Intravenous  Airway Management Planned: LMA  Additional Equipment:   Intra-op Plan:   Post-operative Plan: Extubation in OR  Informed Consent: I have reviewed the patients History and Physical, chart, labs and discussed the procedure including the risks, benefits and alternatives for the proposed anesthesia with the patient or authorized representative who has indicated his/her understanding and acceptance.   Dental advisory given  Plan Discussed with: CRNA, Anesthesiologist and Surgeon  Anesthesia Plan Comments:        Anesthesia Quick Evaluation

## 2013-04-15 NOTE — Transfer of Care (Signed)
Immediate Anesthesia Transfer of Care Note  Patient: Mary Young  Procedure(s) Performed: Procedure(s): MASTECTOMY MODIFIED RADICAL (Right)  Patient Location: PACU  Anesthesia Type:General  Level of Consciousness: sedated and patient cooperative  Airway & Oxygen Therapy: Patient Spontanous Breathing  Post-op Assessment: Report given to PACU RN, Post -op Vital signs reviewed and stable and Patient moving all extremities X 4  Post vital signs: Reviewed and stable  Complications: No apparent anesthesia complications

## 2013-04-15 NOTE — Anesthesia Procedure Notes (Signed)
Procedure Name: LMA Insertion Date/Time: 04/15/2013 7:41 AM Performed by: Sherie Don Pre-anesthesia Checklist: Patient identified, Emergency Drugs available, Suction available, Patient being monitored and Timeout performed Patient Re-evaluated:Patient Re-evaluated prior to inductionOxygen Delivery Method: Circle system utilized Preoxygenation: Pre-oxygenation with 100% oxygen Intubation Type: IV induction Ventilation: Mask ventilation without difficulty LMA: LMA inserted and LMA with gastric port inserted LMA Size: 4.0 Placement Confirmation: positive ETCO2 Tube secured with: Tape Dental Injury: Teeth and Oropharynx as per pre-operative assessment

## 2013-04-15 NOTE — Progress Notes (Signed)
Spoke to Dr. Gypsy Balsam regarding PO fluid intake this morning. Advised by Dr. Gypsy Balsam to finishing working patient up and send patient down.

## 2013-04-15 NOTE — Preoperative (Signed)
Beta Blockers   Reason not to administer Beta Blockers:Not Applicable 

## 2013-04-15 NOTE — Anesthesia Postprocedure Evaluation (Signed)
Anesthesia Post Note  Patient: Mary Young  Procedure(s) Performed: Procedure(s) (LRB): MASTECTOMY MODIFIED RADICAL (Right)  Anesthesia type: general  Patient location: PACU  Post pain: Pain level controlled  Post assessment: Patient's Cardiovascular Status Stable  Last Vitals:  Filed Vitals:   04/15/13 1045  BP: 176/80  Pulse: 68  Temp:   Resp: 10    Post vital signs: Reviewed and stable  Level of consciousness: sedated  Complications: No apparent anesthesia complications

## 2013-04-15 NOTE — Interval H&P Note (Signed)
History and Physical Interval Note:  04/15/2013 7:03 AM  Mary Young  has presented today for surgery, with the diagnosis of right breast cancer   The goals and the  various methods of treatment have been discussed with the patient and family. After consideration of risks, benefits and other options for treatment, the patient has consented to  Procedure(s): MASTECTOMY MODIFIED RADICAL (Right) as a surgical intervention .  The patient's history has been reviewed, patient examined today, no change in status, stable for surgery.  I have reviewed the patient's chart and labs.  Questions were answered to the patient's satisfaction.     Ernestene Mention

## 2013-04-16 ENCOUNTER — Encounter: Payer: Self-pay | Admitting: *Deleted

## 2013-04-16 MED ORDER — HYDROCODONE-ACETAMINOPHEN 5-325 MG PO TABS: 1.0000 | ORAL_TABLET | ORAL | Status: DC | PRN

## 2013-04-16 NOTE — Discharge Summary (Signed)
Physician Discharge Summary  Patient ID: Mary Young MRN: [id number] DOB/AGE: 1941/01/15 72 y.o.  Admit date: 04/15/2013 Discharge date: 04/16/2013  Admission Diagnoses: Right breast cancer  Discharge Diagnoses:  Active Problems:   Cancer of upper-outer quadrant of female breast   Discharged Condition: good  Hospital Course: The patient underwent a R mastectomy.  She did well overnight.  Her pain is controlled with PO meds and she is tolerating a reg diet.   Consults: None  Significant Diagnostic Studies: labs: cbc  Treatments: surgery: mastectomy  Discharge Exam: Blood pressure 100/69, pulse 85, temperature 98.1 F (36.7 C), temperature source Oral, resp. rate 18, height 5\' 3"  (1.6 m), weight 130 lb 8 oz (59.194 kg), SpO2 98.00%. General appearance: alert and cooperative Chest wall: no tenderness Incision/Wound: small amt of bruising in upper ant axilla, inc: c/d/i JP's: SS fluid  Disposition: Home  Discharge Orders   Future Appointments Provider Department Dept Phone   04/22/2013 11:00 AM Robyne Askew, MD [facility] Surgery, [place] [phone]   04/25/2013 1:00 PM Victorino December, MD [facility] MEDICAL ONCOLOGY [phone]   05/24/2013 8:30 AM Windell Hummingbird [facility] MEDICAL ONCOLOGY [phone]   05/24/2013 9:00 AM Victorino December, MD [facility] MEDICAL ONCOLOGY [phone]   Future Orders Complete By Expires     Discharge patient  As directed         Medication List    TAKE these medications       aspirin EC 81 MG tablet  Take 81 mg by mouth daily.     Calcium-Vitamin D-Vitamin K 500-100-40 MG-UNT-MCG Chew  Chew 1 tablet by mouth daily.     dorzolamide 2 % ophthalmic solution  Commonly known as:  TRUSOPT  Place 1 drop into both eyes 2 (two) times daily.     FISH OIL-BORAGE-FLAX-SAFFLOWER PO  Take 1 capsule by mouth daily.     HYDROcodone-acetaminophen 5-325 MG per tablet  Commonly known as:   NORCO/VICODIN  Take 1-2 tablets by mouth every 4 (four) hours as needed.     letrozole 2.5 MG tablet  Commonly known as:  FEMARA  Take 1 tablet (2.5 mg total) by mouth daily.     lisinopril 10 MG tablet  Commonly known as:  PRINIVIL,ZESTRIL  Take 10 mg by mouth daily.     multivitamin with minerals Tabs  Take 1 tablet by mouth daily.     simvastatin 20 MG tablet  Commonly known as:  ZOCOR  Take 20 mg by mouth every other day.     SUPER B COMPLEX PO  Take 1 tablet by mouth daily.     timolol 0.5 % ophthalmic solution  Commonly known as:  TIMOPTIC  Place 1 drop into both eyes 2 (two) times daily. Non preservative     TRAVATAN Z 0.004 % Soln ophthalmic solution  Generic drug:  Travoprost (BAK Free)  Place 1 drop into both eyes at bedtime.     vitamin E 400 UNIT capsule  Take 400 Units by mouth daily.           Follow-up Information   Follow up with Robyne Askew, MD On 04/22/2013. (per previous instructions)    Contact information:   [address] [place] [address] [phone]       Signed: Vanita Panda 04/16/2013, 9:51 AM

## 2013-04-16 NOTE — Progress Notes (Signed)
Discharge home. Home discharge instruction given, no question verbalized. 

## 2013-04-16 NOTE — Discharge Planning (Signed)
Copy of dc instructions and rx to pt. Who verbalizes understanding. JP teaching done, pt. Able to demonstrate.  Will dc after lunch.

## 2013-04-18 ENCOUNTER — Encounter (HOSPITAL_COMMUNITY): Payer: Self-pay | Admitting: General Surgery

## 2013-04-22 ENCOUNTER — Encounter (INDEPENDENT_AMBULATORY_CARE_PROVIDER_SITE_OTHER): Payer: Self-pay | Admitting: General Surgery

## 2013-04-22 ENCOUNTER — Encounter (INDEPENDENT_AMBULATORY_CARE_PROVIDER_SITE_OTHER): Payer: Self-pay

## 2013-04-22 ENCOUNTER — Ambulatory Visit (INDEPENDENT_AMBULATORY_CARE_PROVIDER_SITE_OTHER): Payer: Medicare Other | Admitting: General Surgery

## 2013-04-22 ENCOUNTER — Telehealth (INDEPENDENT_AMBULATORY_CARE_PROVIDER_SITE_OTHER): Payer: Self-pay | Admitting: Surgery

## 2013-04-22 VITALS — BP 130/80 | HR 62 | Temp 97.6°F | Resp 16 | Ht 63.5 in | Wt 127.4 lb

## 2013-04-22 DIAGNOSIS — C50411 Malignant neoplasm of upper-outer quadrant of right female breast: Secondary | ICD-10-CM

## 2013-04-22 DIAGNOSIS — C50419 Malignant neoplasm of upper-outer quadrant of unspecified female breast: Secondary | ICD-10-CM

## 2013-04-22 NOTE — Patient Instructions (Signed)
Continue to record drain output

## 2013-04-25 ENCOUNTER — Encounter: Payer: Self-pay | Admitting: Internal Medicine

## 2013-04-25 ENCOUNTER — Encounter: Payer: Self-pay | Admitting: Oncology

## 2013-04-25 ENCOUNTER — Ambulatory Visit (HOSPITAL_BASED_OUTPATIENT_CLINIC_OR_DEPARTMENT_OTHER): Payer: Medicare Other | Admitting: Oncology

## 2013-04-25 VITALS — BP 134/70 | HR 70 | Temp 98.3°F | Resp 20 | Ht 63.5 in | Wt 129.2 lb

## 2013-04-25 DIAGNOSIS — C50411 Malignant neoplasm of upper-outer quadrant of right female breast: Secondary | ICD-10-CM

## 2013-04-25 DIAGNOSIS — C773 Secondary and unspecified malignant neoplasm of axilla and upper limb lymph nodes: Secondary | ICD-10-CM

## 2013-04-25 DIAGNOSIS — C50911 Malignant neoplasm of unspecified site of right female breast: Secondary | ICD-10-CM

## 2013-04-25 DIAGNOSIS — C50419 Malignant neoplasm of upper-outer quadrant of unspecified female breast: Secondary | ICD-10-CM

## 2013-04-26 ENCOUNTER — Encounter (INDEPENDENT_AMBULATORY_CARE_PROVIDER_SITE_OTHER): Payer: Self-pay | Admitting: General Surgery

## 2013-04-26 ENCOUNTER — Ambulatory Visit (INDEPENDENT_AMBULATORY_CARE_PROVIDER_SITE_OTHER): Payer: Medicare Other | Admitting: General Surgery

## 2013-04-26 VITALS — BP 116/68 | HR 58 | Temp 97.6°F | Resp 14 | Ht 63.5 in | Wt 127.6 lb

## 2013-04-26 DIAGNOSIS — C50419 Malignant neoplasm of upper-outer quadrant of unspecified female breast: Secondary | ICD-10-CM

## 2013-04-26 DIAGNOSIS — C50411 Malignant neoplasm of upper-outer quadrant of right female breast: Secondary | ICD-10-CM

## 2013-04-26 NOTE — Patient Instructions (Signed)
Continue to record output from drain

## 2013-04-26 NOTE — Progress Notes (Signed)
Subjective:     Patient ID: Mary Young, female   DOB: 09/02/1941, 72 y.o.   MRN: [id number]  HPI The patient is a 72 year old white female who is one-week status post right modified radical mastectomy by Dr. Derrell Lolling. She has been recording the output from her drains and both drains are putting more than 40 cc a day. She otherwise feels well and has no complaints today.  Review of Systems     Objective:   Physical Exam On exam her right mastectomy incision is healing nicely with no sign of infection or seroma    Assessment:     The patient is one-week status post right modified radical mastectomy     Plan:     At this point both drains are putting out too much fluid to remove at this time. We will follow her up next week for another recheck

## 2013-04-26 NOTE — Telephone Encounter (Signed)
Left message for her to call me about path, but then learned she was seeing Dr Carolynne Edouard that day for her post op visit

## 2013-04-26 NOTE — Progress Notes (Signed)
Subjective:     Patient ID: Mary Young, female   DOB: 03-20-1941, 72 y.o.   MRN: [id number]  HPI The patient is a 72 year old white female who is 2 weeks status post right modified radical mastectomy by Dr. Derrell Lolling. She also complains of discomfort where the drain tubes entered the skin. She's been recording the output from both drains and drained on appears to be less than 30 cc a day for the last couple days.  Review of Systems     Objective:   Physical Exam On exam her right mastectomy incision is healing nicely with no sign of infection or seroma. Drain 1 was removed without difficulty from the axilla and she tolerated this well.    Assessment:     The patient is 2 weeks status post right modified radical mastectomy     Plan:     At this point she will follow up next week with Dr. Derrell Lolling to try to get her last drain out. She has all ready met with the medical oncologist to go over her pathology

## 2013-05-02 ENCOUNTER — Ambulatory Visit: Payer: Medicare Other | Admitting: Oncology

## 2013-05-04 ENCOUNTER — Telehealth: Payer: Self-pay | Admitting: Emergency Medicine

## 2013-05-04 ENCOUNTER — Encounter (INDEPENDENT_AMBULATORY_CARE_PROVIDER_SITE_OTHER): Payer: Self-pay | Admitting: General Surgery

## 2013-05-04 ENCOUNTER — Ambulatory Visit (INDEPENDENT_AMBULATORY_CARE_PROVIDER_SITE_OTHER): Payer: Medicare Other | Admitting: General Surgery

## 2013-05-04 VITALS — BP 130/68 | HR 50 | Temp 98.2°F | Resp 15 | Ht 63.5 in | Wt 129.8 lb

## 2013-05-04 DIAGNOSIS — C50411 Malignant neoplasm of upper-outer quadrant of right female breast: Secondary | ICD-10-CM

## 2013-05-04 DIAGNOSIS — C50419 Malignant neoplasm of upper-outer quadrant of unspecified female breast: Secondary | ICD-10-CM

## 2013-05-04 NOTE — Patient Instructions (Signed)
Your mastectomy wound is healing nicely. There is no sign of infection, fluid, or blood clots.  It is still draining too much to take the drain out.  Return to see Dr. Derrell Lolling in approximately one week and hopefully we will remove the drain at that time.  You will be scheduled for Port-A-Cath insertion.   Implanted Port Instructions An implanted port is a central line that has a round shape and is placed under the skin. It is used for long-term IV (intravenous) access for:  Medicine.  Fluids.  Liquid nutrition, such as TPN (total parenteral nutrition).  Blood samples. Ports can be placed:  In the chest area just below the collarbone (this is the most common place.)  In the arms.  In the belly (abdomen) area.  In the legs. PARTS OF THE PORT A port has 2 main parts:  The reservoir. The reservoir is round, disc-shaped, and will be a small, raised area under your skin.  The reservoir is the part where a needle is inserted (accessed) to either give medicines or to draw blood.  The catheter. The catheter is a long, slender tube that extends from the reservoir. The catheter is placed into a large vein.  Medicine that is inserted into the reservoir goes into the catheter and then into the vein. INSERTION OF THE PORT  The port is surgically placed in either an operating room or in a procedural area (interventional radiology).  Medicine may be given to help you relax during the procedure.  The skin where the port will be inserted is numbed (local anesthetic).  1 or 2 small cuts (incisions) will be made in the skin to insert the port.  The port can be used after it has been inserted. INCISION SITE CARE  The incision site may have small adhesive strips on it. This helps keep the incision site closed. Sometimes, no adhesive strips are placed. Instead of adhesive strips, a special kind of surgical glue is used to keep the incision closed.  If adhesive strips were placed on the  incision sites, do not take them off. They will fall off on their own.  The incision site may be sore for 1 to 2 days. Pain medicine can help.  Do not get the incision site wet. Bathe or shower as directed by your caregiver.  The incision site should heal in 5 to 7 days. A small scar may form after the incision has healed. ACCESSING THE PORT Special steps must be taken to access the port:  Before the port is accessed, a numbing cream can be placed on the skin. This helps numb the skin over the port site.  A sterile technique is used to access the port.  The port is accessed with a needle. Only "non-coring" port needles should be used to access the port. Once the port is accessed, a blood return should be checked. This helps ensure the port is in the vein and is not clogged (clotted).  If your caregiver believes your port should remain accessed, a clear (transparent) bandage will be placed over the needle site. The bandage and needle will need to be changed every week or as directed by your caregiver.  Keep the bandage covering the needle clean and dry. Do not get it wet. Follow your caregiver's instructions on how to take a shower or bath when the port is accessed.  If your port does not need to stay accessed, no bandage is needed over the port. FLUSHING THE PORT  Flushing the port keeps it from getting clogged. How often the port is flushed depends on:  If a constant infusion is running. If a constant infusion is running, the port may not need to be flushed.  If intermittent medicines are given.  If the port is not being used. For intermittent medicines:  The port will need to be flushed:  After medicines have been given.  After blood has been drawn.  As part of routine maintenance.  A port is normally flushed with:  Normal saline.  Heparin.  Follow your caregiver's advice on how often, how much, and the type of flush to use on your port. IMPORTANT PORT  INFORMATION  Tell your caregiver if you are allergic to heparin.  After your port is placed, you will get a manufacturer's information card. The card has information about your port. Keep this card with you at all times.  There are many types of ports available. Know what kind of port you have.  In case of an emergency, it may be helpful to wear a medical alert bracelet. This can help alert health care workers that you have a port.  The port can stay in for as long as your caregiver believes it is necessary.  When it is time for the port to come out, surgery will be done to remove it. The surgery will be similar to how the port was put in.  If you are in the hospital or clinic:  Your port will be taken care of and flushed by a nurse.  If you are at home:  A home health care nurse may give medicines and take care of the port.  You or a family member can get special training and directions for giving medicine and taking care of the port at home. SEEK IMMEDIATE MEDICAL CARE IF:   Your port does not flush or you are unable to get a blood return.  New drainage or pus is coming from the incision.  A bad smell is coming from the incision site.  You develop swelling or increased redness at the incision site.  You develop increased swelling or pain at the port site.  You develop swelling or pain in the surrounding skin near the port.  You have an oral temperature above 102 F (38.9 C), not controlled by medicine. MAKE SURE YOU:   Understand these instructions.  Will watch your condition.  Will get help right away if you are not doing well or get worse. Document Released: 11/03/2005 Document Revised: 01/26/2012 Document Reviewed: 01/25/2009 [facility] Patient Information 2014 [place], [place].

## 2013-05-04 NOTE — Progress Notes (Signed)
Patient ID: Mary Young, female   DOB: Feb 05, 1941, 72 y.o.   MRN: [id number]  Chief Complaint  Patient presents with  . Follow-up    reck maste possible drain pull    HPI Mary Young is a 72 y.o. female.  She returns for postop followup following her recent right mastectomy, and for counseling and scheduling of Port-A-Cath.  Approximate 7 months ago this patient presented with a large tumor in her right breast, upper outer quadrant. Biopsy showed invasive mammary carcinoma, receptor positive, HER-2 negative, clinical stage T3, N1. She has a past history of left partial mastectomy sentinel node biopsy radiation therapy in [place]. Family history is negative.  She received neoadjuvant letrozole. She had minimal response. On 04/15/2013 she underwent right modified radical mastectomy. She has done well. One of her drains has been removed. The skin flap drain remains in place it is draining 30 cc of per day or more.  Pathology report shows a residual 4.3 cm invasive ductal carcinoma. 5/23 lymph nodes were positive for metastatic carcinoma. Resection margins negative, receptor positive, HER-2 negative, Ki-66%. Stage ypT2, ypN2a.Dr. Drue Second has met with the patient and discussed her care with me. She plans chemotherapy and request of Port-A-Cath.  The patient is aware of this. I discussed Port-A-Cath insertion with her in detail. She is very well aware of this because her sister-in-law has one. HPI  Past Medical History  Diagnosis Date  . Breast cancer     left lumpectomy  . Hypertension   . Glaucoma   . Central retinal vein occlusion   . Joint pain   . Asthma     seasonal  . Dyslipidemia   . H/O echocardiogram 09/27/2009    Note to have some aortic valve sclerosis, mild mitral valve prolapse, mild mitral regurgitation, and mild asymptomatic LVH with an EF>55%  . History of stress test 09/27/2009    Normal Myocardial perfusion study. No significant ischemia  demonstrated, this low risk scan. compared to the previous study.,there is no significant change, Normal myocardial Perfusion imaging. EF>83%    Past Surgical History  Procedure Laterality Date  . Abdominal hysterectomy  1991  . Breast lumpectomy  2003  . Arthroscopy knee w/ drilling  2005/2008    Left knee/Right knee  . Tubal ligation  1983  . Mastectomy modified radical Right 04/15/2013    Procedure: MASTECTOMY MODIFIED RADICAL;  Surgeon: Ernestene Mention, MD;  Location: [facility] OR;  Service: General;  Laterality: Right;    Family History  Problem Relation Age of Onset  . Cancer Brother     tumor near liver pancrese   half brother    Social History History  Substance Use Topics  . Smoking status: Never Smoker   . Smokeless tobacco: Not on file  . Alcohol Use: Yes     Comment: occasional glass of wine    Allergies  Allergen Reactions  . Percocet (Oxycodone-Acetaminophen) Nausea And Vomiting  . Precipitated Sulfur (Sulfur) Itching    Eye medication  . Timolol Rash    Eye medication with preservatives    Current Outpatient Prescriptions  Medication Sig Dispense Refill  . aspirin EC 81 MG tablet Take 81 mg by mouth daily.      . B Complex-C (SUPER B COMPLEX PO) Take 1 tablet by mouth daily.       . Calcium-Vitamin D-Vitamin K 500-100-40 MG-UNT-MCG CHEW Chew 1 tablet by mouth daily.       . dorzolamide (TRUSOPT) 2 %  ophthalmic solution Place 1 drop into both eyes 2 (two) times daily.       Marland Kitchen letrozole (FEMARA) 2.5 MG tablet Take 1 tablet (2.5 mg total) by mouth daily.  30 tablet  11  . losartan (COZAAR) 100 MG tablet Take 100 mg by mouth daily.      . Multiple Vitamin (MULTIVITAMIN WITH MINERALS) TABS Take 1 tablet by mouth daily.      . simvastatin (ZOCOR) 20 MG tablet Take 20 mg by mouth every other day.       . timolol (TIMOPTIC) 0.5 % ophthalmic solution Place 1 drop into both eyes 2 (two) times daily. Non preservative      . TRAVATAN Z 0.004 % SOLN ophthalmic solution  Place 1 drop into both eyes at bedtime.       . vitamin E 400 UNIT capsule Take 400 Units by mouth daily.       No current facility-administered medications for this visit.    Review of Systems Review of Systems  Constitutional: Negative for fever, chills and unexpected weight change.  HENT: Negative for hearing loss, congestion, sore throat, trouble swallowing and voice change.   Eyes: Negative for visual disturbance.  Respiratory: Negative for cough and wheezing.   Cardiovascular: Negative for chest pain, palpitations and leg swelling.  Gastrointestinal: Negative for nausea, vomiting, abdominal pain, diarrhea, constipation, blood in stool, abdominal distention and anal bleeding.  Genitourinary: Negative for hematuria, vaginal bleeding and difficulty urinating.  Musculoskeletal: Negative for arthralgias.  Skin: Negative for rash and wound.  Neurological: Negative for seizures, syncope and headaches.  Hematological: Negative for adenopathy. Does not bruise/bleed easily.  Psychiatric/Behavioral: Negative for confusion.    Blood pressure 130/68, pulse 50, temperature 98.2 F (36.8 C), temperature source Temporal, resp. rate 15, height 5' 3.5" (1.613 m), weight 129 lb 12.8 oz (58.877 kg).  Physical Exam Physical Exam  Constitutional: She is oriented to person, place, and time. She appears well-developed and well-nourished. No distress.  HENT:  Head: Atraumatic.  Nose: Nose normal.  Eyes: Conjunctivae and EOM are normal. Pupils are equal, round, and reactive to light. Left eye exhibits no discharge. No scleral icterus.  Neck: Neck supple. No JVD present. No tracheal deviation present. No thyromegaly present.  Cardiovascular: Normal rate, regular rhythm, normal heart sounds and intact distal pulses.   No murmur heard. Pulmonary/Chest: Effort normal and breath sounds normal. No respiratory distress. She has no wheezes. She has no rales. She exhibits no tenderness.  Right mastectomy skin  flaps looked healthy. No infection. No seroma. Single drain in place. Serosanguineous drainage. Right shoulder abducts 100. A little tight.  Musculoskeletal: She exhibits no edema and no tenderness.  Lymphadenopathy:    She has no cervical adenopathy.  Neurological: She is alert and oriented to person, place, and time. She exhibits normal muscle tone. Coordination normal.  Skin: Skin is warm. No rash noted. She is not diaphoretic. No erythema. No pallor.  Psychiatric: She has a normal mood and affect. Her behavior is normal. Judgment and thought content normal.    Data Reviewed Pathology report. Discussed care with Dr. Drue Second.  Assessment    Invasive ductal carcinoma right breast, status post neoadjuvant left resolved, minimal response.  Status post right modified radical mastectomy, 04/15/2013. Recovering uneventfully. Stage T2, N2a.  We will need to leave the drain one more week      Plan    Return to see me in 6-7 days, hopefully we can remove the remaining drain at  that time.  Scheduled for Port-A-Cath insertion after the drain is removed.  I discussed the indications, details, techniques, and numerous risk of Port-A-Cath insertion with her, including bleeding, infection, catheter malfunction or embolization, a pneumothorax. She understands all these issues. All her questions are answered. She agrees with this plan.        Angelia Mould. Derrell Lolling, M.D., [facility] General and Minimally invasive Surgery Breast and Colorectal Surgery Office:   [phone] Pager:   [phone]  05/04/2013, 3:46 PM

## 2013-05-04 NOTE — Telephone Encounter (Signed)
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

## 2013-05-05 ENCOUNTER — Encounter (HOSPITAL_COMMUNITY): Payer: Self-pay | Admitting: Pharmacy Technician

## 2013-05-10 ENCOUNTER — Other Ambulatory Visit (INDEPENDENT_AMBULATORY_CARE_PROVIDER_SITE_OTHER): Payer: Self-pay | Admitting: General Surgery

## 2013-05-10 ENCOUNTER — Encounter: Payer: Self-pay | Admitting: Oncology

## 2013-05-10 ENCOUNTER — Encounter (INDEPENDENT_AMBULATORY_CARE_PROVIDER_SITE_OTHER): Payer: Self-pay | Admitting: General Surgery

## 2013-05-10 ENCOUNTER — Ambulatory Visit (INDEPENDENT_AMBULATORY_CARE_PROVIDER_SITE_OTHER): Payer: Medicare Other | Admitting: General Surgery

## 2013-05-10 VITALS — BP 122/64 | HR 78 | Temp 97.6°F | Resp 18 | Ht 63.5 in | Wt 131.8 lb

## 2013-05-10 DIAGNOSIS — C773 Secondary and unspecified malignant neoplasm of axilla and upper limb lymph nodes: Secondary | ICD-10-CM

## 2013-05-10 DIAGNOSIS — C50911 Malignant neoplasm of unspecified site of right female breast: Secondary | ICD-10-CM

## 2013-05-10 DIAGNOSIS — C50919 Malignant neoplasm of unspecified site of unspecified female breast: Secondary | ICD-10-CM

## 2013-05-10 NOTE — Pre-Procedure Instructions (Addendum)
Neshia Mckenzie Dill  05/10/2013   Your procedure is scheduled on:  May 13, 2013  Report to [facility] at 5:30 AM.  Call this number if you have problems the morning of surgery: [phone]   Remember:  Discontinue aspirin, coumadin, plavix, effient, herbal medications 5 days prior to surgery   Do not eat food or drink liquids after midnight.   Take these medicines the morning of surgery with A SIP OF WATER: eye drops   Do not wear jewelry, make-up or nail polish.  Do not wear lotions, powders, or perfumes. You may wear deodorant.  Do not shave 48 hours prior to surgery. Men may shave face and neck.  Do not bring valuables to the hospital.  [facility] is not responsible                   for any belongings or valuables.  Contacts, dentures or bridgework may not be worn into surgery.  Leave suitcase in the car. After surgery it may be brought to your room.  For patients admitted to the hospital, checkout time is 11:00 AM the day of  discharge.   Patients discharged the day of surgery will not be allowed to drive  home.  Name and phone number of your driver: family/friend  Special Instructions: Shower using CHG 2 nights before surgery and the night before surgery.  If you shower the day of surgery use CHG.  Use special wash - you have one bottle of CHG for all showers.  You should use approximately 1/3 of the bottle for each shower.   Please read over the following fact sheets that you were given: Pain Booklet, Coughing and Deep Breathing and Surgical Site Infection Prevention

## 2013-05-10 NOTE — Progress Notes (Signed)
Patient ID: Mary Young, female   DOB: 17-Aug-1941, 72 y.o.   MRN: [id number] History: She returned for postop followup. Wound check and drain check. The drainage is down to 22 and 25 cc per day. She is 3 weeks postop Right mastectomy. She is scheduled for Port-A-Cath this Friday.  Exam: Patient is in good spirits. Right mastectomy skin flaps look good. They are well stuck down. I removed the drain and dressed the drain site  Assessment invasive ductal carcinoma right breast, 4.3 cm tumor, 5 of 23 lymph nodes positive, receptor positive, HER-2-negative, stage ypT2, ypN2a. Recovering uneventfully following right mastectomy and axillary lymph node dissection  Plan: For PAC insertion this Friday See me in 3 weeks for wound check Referred to physical therapy for right shoulder range of motion She will see Dr. Drue Second July 8. She should be able to start chemotherapy in July.   Angelia Mould. Derrell Lolling, M.D., [facility] General and Minimally invasive Surgery Breast and Colorectal Surgery Office:   [phone] Pager:   [phone]

## 2013-05-10 NOTE — Patient Instructions (Signed)
We removed the last drain today. You may start taking showers tomorrow. No tub baths ye we will arrange for a referral to physical therapy. Hopefully you can start that tomorrow  You scheduled for Port-A-Cath insertion this coming Friday.  Return to see Dr. Derrell Lolling in 3 weeks for a wound check.

## 2013-05-11 ENCOUNTER — Encounter (HOSPITAL_COMMUNITY)
Admission: RE | Admit: 2013-05-11 | Discharge: 2013-05-11 | Disposition: A | Discharge status: Home / Self Care | Payer: Medicare Other | Source: Ambulatory Visit | Attending: General Surgery | Admitting: General Surgery

## 2013-05-11 ENCOUNTER — Encounter (HOSPITAL_COMMUNITY): Payer: Self-pay

## 2013-05-11 ENCOUNTER — Telehealth: Payer: Self-pay | Admitting: Medical Oncology

## 2013-05-11 ENCOUNTER — Other Ambulatory Visit (INDEPENDENT_AMBULATORY_CARE_PROVIDER_SITE_OTHER): Payer: Self-pay | Admitting: General Surgery

## 2013-05-11 DIAGNOSIS — C50911 Malignant neoplasm of unspecified site of right female breast: Secondary | ICD-10-CM

## 2013-05-11 HISTORY — DX: Other seasonal allergic rhinitis: J30.2

## 2013-05-11 LAB — BASIC METABOLIC PANEL
BUN: 14 mg/dL (ref 6–23)
CO2: 27 mEq/L (ref 19–32)
Calcium: 9.2 mg/dL (ref 8.4–10.5)
Chloride: 106 mEq/L (ref 96–112)
Creatinine, Ser: 0.6 mg/dL (ref 0.50–1.10)
GFR calc Af Amer: >90 mL/min (ref >90)
GFR calc non Af Amer: 89 mL/min — ABNORMAL LOW (ref >90)
Glucose, Bld: 95 mg/dL (ref 70–99)
Potassium: 3.8 mEq/L (ref 3.5–5.1)
Sodium: 143 mEq/L (ref 135–145)

## 2013-05-11 LAB — CBC
HCT: 38.7 % (ref 36.0–46.0)
Hemoglobin: 13.5 g/dL (ref 12.0–15.0)
MCH: 31.8 pg (ref 26.0–34.0)
MCHC: 34.9 g/dL (ref 30.0–36.0)
MCV: 91.1 fL (ref 78.0–100.0)
Platelets: 227 10*3/uL (ref 150–400)
RBC: 4.25 MIL/uL (ref 3.87–5.11)
RDW: 12 % (ref 11.5–15.5)
WBC: 5.4 10*3/uL (ref 4.0–10.5)

## 2013-05-11 LAB — SURGICAL PCR SCREEN
MRSA, PCR: NEGATIVE
Staphylococcus aureus: NEGATIVE

## 2013-05-11 MED ORDER — CHLORHEXIDINE GLUCONATE 4 % EX LIQD: 1.0000 "application " | Freq: Once | CUTANEOUS | Status: DC

## 2013-05-11 NOTE — H&P (Signed)
    Mary Young   MRN:  [id number]   Description: 72 year old female  Provider: Ernestene Mention, MD  Department: [facility]        Diagnoses    Breast cancer metastasized to axillary lymph node, right    -  Primary    174.9,  196.3        Current Vitals -    BP Pulse Temp(Src) Resp Ht Wt    122/64 78 97.6 F (36.4 C) (Temporal) 18 5' 3.5" (1.613 m) 131 lb 12.8 oz (59.784 kg)    BMI 22.98 kg/m2                History and Physical   Ernestene Mention, MD   Status: Signed                       History: She returned for postop followup. Wound check and drain check. The drainage is down to 22 and 25 cc per day. She is 3 weeks postop Right mastectomy. She is scheduled for Port-A-Cath this Friday.   Exam: Patient is in good spirits. Right mastectomy skin flaps look good. They are well stuck down. I removed the drain and dressed the drain site Lungs: Clear to auscultation bilaterally Heart: Regular rate and rhythm. No ectopy. No murmur. Extremities: No edema   Assessment invasive ductal carcinoma right breast, 4.3 cm tumor, 5 of 23 lymph nodes positive, receptor positive, HER-2-negative, stage ypT2, ypN2a. Status post neoadjuvant chemotherapy Recovering uneventfully following right mastectomy and axillary lymph node dissection Remote history left partial mastectomy and sentinel node biopsy and radiation therapy in [facility], 2003. Hypertension Heart murmur Glaucoma Her recent retinal vein occlusion Negative family history for breast cancer   Plan: For PAC insertion this Friday See me in 3 weeks for wound check Referred to physical therapy for right shoulder range of motion She will see Dr. Drue Second July 8. She should be able to start chemotherapy in July.     Angelia Mould. Derrell Lolling, M.D., [facility] General and Minimally invasive Surgery Breast and Colorectal Surgery Office:   [phone] Pager:    [phone]

## 2013-05-11 NOTE — Telephone Encounter (Signed)
Dr Madilyn Fireman [facility] f/u regarding steroid alternatives d/t pt's history of glaucoma. MD informed.   Patient with lab/MD appt 05/24/13

## 2013-05-11 NOTE — Addendum Note (Signed)
Addended by: Uriyah Raska on: 05/11/2013 01:57 PM   Modules accepted: Orders  

## 2013-05-12 MED ORDER — CEFAZOLIN SODIUM-DEXTROSE 2-3 GM-% IV SOLR
2.0000 g | INTRAVENOUS | Status: AC
  Administered 2013-05-13: 2 g via INTRAVENOUS
  Filled 2013-05-12: qty 50

## 2013-05-13 ENCOUNTER — Encounter (HOSPITAL_COMMUNITY)
Admission: RE | Disposition: A | Discharge status: Home / Self Care | Payer: Self-pay | Source: Ambulatory Visit | Attending: General Surgery

## 2013-05-13 ENCOUNTER — Ambulatory Visit (HOSPITAL_COMMUNITY): Payer: Medicare Other

## 2013-05-13 ENCOUNTER — Encounter (HOSPITAL_COMMUNITY): Payer: Self-pay | Admitting: *Deleted

## 2013-05-13 ENCOUNTER — Ambulatory Visit (HOSPITAL_COMMUNITY)
Admission: RE | Admit: 2013-05-13 | Discharge: 2013-05-13 | Disposition: A | Discharge status: Home / Self Care | Payer: Medicare Other | Source: Ambulatory Visit | Attending: General Surgery | Admitting: General Surgery

## 2013-05-13 ENCOUNTER — Encounter (HOSPITAL_COMMUNITY): Payer: Self-pay | Admitting: Anesthesiology

## 2013-05-13 ENCOUNTER — Ambulatory Visit (HOSPITAL_COMMUNITY): Payer: Medicare Other | Admitting: Anesthesiology

## 2013-05-13 DIAGNOSIS — H349 Unspecified retinal vascular occlusion: Secondary | ICD-10-CM | POA: Insufficient documentation

## 2013-05-13 DIAGNOSIS — C773 Secondary and unspecified malignant neoplasm of axilla and upper limb lymph nodes: Secondary | ICD-10-CM | POA: Insufficient documentation

## 2013-05-13 DIAGNOSIS — I10 Essential (primary) hypertension: Secondary | ICD-10-CM | POA: Insufficient documentation

## 2013-05-13 DIAGNOSIS — C50919 Malignant neoplasm of unspecified site of unspecified female breast: Secondary | ICD-10-CM

## 2013-05-13 DIAGNOSIS — C50411 Malignant neoplasm of upper-outer quadrant of right female breast: Secondary | ICD-10-CM | POA: Diagnosis present

## 2013-05-13 DIAGNOSIS — Z170 Estrogen receptor positive status [ER+]: Secondary | ICD-10-CM | POA: Insufficient documentation

## 2013-05-13 DIAGNOSIS — Z923 Personal history of irradiation: Secondary | ICD-10-CM | POA: Insufficient documentation

## 2013-05-13 DIAGNOSIS — Z9010 Acquired absence of unspecified breast and nipple: Secondary | ICD-10-CM | POA: Insufficient documentation

## 2013-05-13 DIAGNOSIS — R011 Cardiac murmur, unspecified: Secondary | ICD-10-CM | POA: Insufficient documentation

## 2013-05-13 DIAGNOSIS — H409 Unspecified glaucoma: Secondary | ICD-10-CM | POA: Insufficient documentation

## 2013-05-13 HISTORY — PX: PORTACATH PLACEMENT: SHX2246

## 2013-05-13 SURGERY — INSERTION, TUNNELED CENTRAL VENOUS DEVICE, WITH PORT
Anesthesia: General | Site: Chest | Laterality: Left | Wound class: Clean

## 2013-05-13 MED ORDER — PHENYLEPHRINE HCL 10 MG/ML IJ SOLN
INTRAMUSCULAR | Status: DC | PRN
  Administered 2013-05-13 (×6): 40 ug via INTRAVENOUS

## 2013-05-13 MED ORDER — LIDOCAINE-EPINEPHRINE (PF) 1 %-1:200000 IJ SOLN
INTRAMUSCULAR | Status: DC | PRN
  Administered 2013-05-13: 11 mL

## 2013-05-13 MED ORDER — LIDOCAINE-EPINEPHRINE (PF) 1 %-1:200000 IJ SOLN
INTRAMUSCULAR | Status: AC
  Filled 2013-05-13: qty 10

## 2013-05-13 MED ORDER — OXYCODONE HCL 5 MG/5ML PO SOLN: 5.0000 mg | Freq: Once | ORAL | Status: DC | PRN

## 2013-05-13 MED ORDER — HYDROCODONE-ACETAMINOPHEN 5-325 MG PO TABS: 1.0000 | ORAL_TABLET | ORAL | Status: DC | PRN

## 2013-05-13 MED ORDER — OXYCODONE HCL 5 MG PO TABS: 5.0000 mg | ORAL_TABLET | Freq: Once | ORAL | Status: DC | PRN

## 2013-05-13 MED ORDER — 0.9 % SODIUM CHLORIDE (POUR BTL) OPTIME
TOPICAL | Status: DC | PRN
  Administered 2013-05-13: 1000 mL

## 2013-05-13 MED ORDER — ONDANSETRON HCL 4 MG/2ML IJ SOLN
INTRAMUSCULAR | Status: DC | PRN
  Administered 2013-05-13: 4 mg via INTRAVENOUS

## 2013-05-13 MED ORDER — HYDROMORPHONE HCL PF 1 MG/ML IJ SOLN: 0.2500 mg | INTRAMUSCULAR | Status: DC | PRN

## 2013-05-13 MED ORDER — LACTATED RINGERS IV SOLN
INTRAVENOUS | Status: DC | PRN
  Administered 2013-05-13: 07:00:00 via INTRAVENOUS

## 2013-05-13 MED ORDER — HEPARIN SOD (PORK) LOCK FLUSH 100 UNIT/ML IV SOLN
INTRAVENOUS | Status: AC
  Filled 2013-05-13: qty 5

## 2013-05-13 MED ORDER — HEPARIN SODIUM (PORCINE) 5000 UNIT/ML IJ SOLN
5000.0000 [IU] | Freq: Once | INTRAMUSCULAR | Status: AC
  Administered 2013-05-13: 5000 [IU] via SUBCUTANEOUS

## 2013-05-13 MED ORDER — HEPARIN SOD (PORK) LOCK FLUSH 100 UNIT/ML IV SOLN
INTRAVENOUS | Status: DC | PRN
  Administered 2013-05-13: 200 [IU] via INTRAVENOUS

## 2013-05-13 MED ORDER — ONDANSETRON HCL 4 MG/2ML IJ SOLN: 4.0000 mg | Freq: Once | INTRAMUSCULAR | Status: DC | PRN

## 2013-05-13 MED ORDER — MIDAZOLAM HCL 5 MG/5ML IJ SOLN
INTRAMUSCULAR | Status: DC | PRN
  Administered 2013-05-13: 2 mg via INTRAVENOUS

## 2013-05-13 MED ORDER — PROPOFOL 10 MG/ML IV BOLUS
INTRAVENOUS | Status: DC | PRN
  Administered 2013-05-13: 200 mg via INTRAVENOUS

## 2013-05-13 MED ORDER — FENTANYL CITRATE 0.05 MG/ML IJ SOLN
INTRAMUSCULAR | Status: DC | PRN
  Administered 2013-05-13: 100 ug via INTRAVENOUS

## 2013-05-13 MED ORDER — GLYCOPYRROLATE 0.2 MG/ML IJ SOLN
INTRAMUSCULAR | Status: DC | PRN
  Administered 2013-05-13: 0.2 mg via INTRAVENOUS

## 2013-05-13 MED ORDER — EPHEDRINE SULFATE 50 MG/ML IJ SOLN
INTRAMUSCULAR | Status: DC | PRN
  Administered 2013-05-13: 20 mg via INTRAVENOUS

## 2013-05-13 MED ORDER — HEPARIN SODIUM (PORCINE) 5000 UNIT/ML IJ SOLN
INTRAMUSCULAR | Status: AC
  Filled 2013-05-13: qty 1

## 2013-05-13 MED ORDER — LIDOCAINE HCL (CARDIAC) 20 MG/ML IV SOLN
INTRAVENOUS | Status: DC | PRN
  Administered 2013-05-13: 70 mg via INTRAVENOUS

## 2013-05-13 MED ORDER — SODIUM CHLORIDE 0.9 % IR SOLN
Status: DC | PRN
  Administered 2013-05-13: 08:00:00

## 2013-05-13 SURGICAL SUPPLY — 54 items
BAG DECANTER FOR FLEXI CONT (MISCELLANEOUS) ×2 IMPLANT
BLADE SURG 10 STRL SS (BLADE) ×2 IMPLANT
BLADE SURG 15 STRL LF DISP TIS (BLADE) ×1 IMPLANT
BLADE SURG 15 STRL SS (BLADE) ×1
BLADE SURG ROTATE 9660 (MISCELLANEOUS) IMPLANT
CANISTER SUCTION 2500CC (MISCELLANEOUS) ×2 IMPLANT
CHLORAPREP W/TINT 10.5 ML (MISCELLANEOUS) ×2 IMPLANT
CLOTH BEACON ORANGE TIMEOUT ST (SAFETY) ×2 IMPLANT
COVER PROBE W GEL 5X96 (DRAPES) IMPLANT
COVER SURGICAL LIGHT HANDLE (MISCELLANEOUS) ×2 IMPLANT
COVER TRANSDUCER ULTRASND (DRAPES) ×2 IMPLANT
CRADLE DONUT ADULT HEAD (MISCELLANEOUS) ×2 IMPLANT
DECANTER SPIKE VIAL GLASS SM (MISCELLANEOUS) ×2 IMPLANT
DERMABOND ADVANCED (GAUZE/BANDAGES/DRESSINGS) ×1
DERMABOND ADVANCED .7 DNX12 (GAUZE/BANDAGES/DRESSINGS) ×1 IMPLANT
DRAPE C-ARM 42X72 X-RAY (DRAPES) ×2 IMPLANT
DRAPE LAPAROSCOPIC ABDOMINAL (DRAPES) ×2 IMPLANT
DRAPE UTILITY 15X26 W/TAPE STR (DRAPE) ×4 IMPLANT
ELECT CAUTERY BLADE 6.4 (BLADE) ×2 IMPLANT
ELECT REM PT RETURN 9FT ADLT (ELECTROSURGICAL) ×2
ELECTRODE REM PT RTRN 9FT ADLT (ELECTROSURGICAL) ×1 IMPLANT
GAUZE SPONGE 4X4 16PLY XRAY LF (GAUZE/BANDAGES/DRESSINGS) ×2 IMPLANT
GLOVE EUDERMIC 7 POWDERFREE (GLOVE) ×2 IMPLANT
GOWN STRL NON-REIN LRG LVL3 (GOWN DISPOSABLE) ×2 IMPLANT
GOWN STRL REIN XL XLG (GOWN DISPOSABLE) ×2 IMPLANT
INTRODUCER 13FR (MISCELLANEOUS) IMPLANT
INTRODUCER COOK 11FR (CATHETERS) IMPLANT
KIT BASIN OR (CUSTOM PROCEDURE TRAY) ×2 IMPLANT
KIT PORT POWER 8FR ISP CVUE (Catheter) ×2 IMPLANT
KIT PORT POWER 9.6FR MRI PREA (Catheter) IMPLANT
KIT PORT POWER ISP 8FR (Catheter) IMPLANT
KIT POWER CATH 8FR (Catheter) IMPLANT
KIT ROOM TURNOVER OR (KITS) ×2 IMPLANT
NEEDLE HYPO 25GX1X1/2 BEV (NEEDLE) ×2 IMPLANT
NS IRRIG 1000ML POUR BTL (IV SOLUTION) ×2 IMPLANT
PACK SURGICAL SETUP 50X90 (CUSTOM PROCEDURE TRAY) ×2 IMPLANT
PAD ARMBOARD 7.5X6 YLW CONV (MISCELLANEOUS) ×2 IMPLANT
PENCIL BUTTON HOLSTER BLD 10FT (ELECTRODE) ×2 IMPLANT
SET INTRODUCER 12FR PACEMAKER (SHEATH) IMPLANT
SET SHEATH INTRODUCER 10FR (MISCELLANEOUS) IMPLANT
SHEATH COOK PEEL AWAY SET 9F (SHEATH) IMPLANT
SURGILUBE 3G PEEL PACK STRL (MISCELLANEOUS) IMPLANT
SUT MNCRL AB 4-0 PS2 18 (SUTURE) ×2 IMPLANT
SUT PROLENE 2 0 CT2 30 (SUTURE) ×4 IMPLANT
SUT VIC AB 3-0 SH 18 (SUTURE) ×2 IMPLANT
SYR 20ML ECCENTRIC (SYRINGE) ×4 IMPLANT
SYR 5ML LUER SLIP (SYRINGE) ×2 IMPLANT
SYR BULB 3OZ (MISCELLANEOUS) ×2 IMPLANT
SYR CONTROL 10ML LL (SYRINGE) ×2 IMPLANT
SYRINGE 10CC LL (SYRINGE) ×2 IMPLANT
TOWEL OR 17X24 6PK STRL BLUE (TOWEL DISPOSABLE) ×4 IMPLANT
TOWEL OR 17X26 10 PK STRL BLUE (TOWEL DISPOSABLE) ×2 IMPLANT
TUBE CONNECTING 12X1/4 (SUCTIONS) ×2 IMPLANT
YANKAUER SUCT BULB TIP NO VENT (SUCTIONS) ×2 IMPLANT

## 2013-05-13 NOTE — Progress Notes (Signed)
Report given to angel rn as caregiver 

## 2013-05-13 NOTE — Op Note (Signed)
Patient Name:           Mary Young   Date of Surgery:        05/13/2013  Pre op Diagnosis:      Invasive cancer right breast, pathologic stage ypT2, ypN2a, , receptor positive  Post op Diagnosis:    same  Procedure:                 Insertion of power port tunneled venous vascular access device with fluoroscopic guidance and positioning  Surgeon:                     Angelia Mould. Derrell Lolling, M.D., FACS  Assistant:                      None  Operative Indications:   This patient initially presented 7 months ago with a large tumor in her right breast, upper outer quadrant. Imaging studies showed a 5.2 cm tumor and a 2.0 cm right axillary lymph node. 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  She has a past history of left partial mastectomy and sentinel node biopsy and radiation therapy in [facility]. His tumor might have been microinvasive, node negative disease. She has no evidence of recurrence there.  She completed a course of  neoadjuvant letrozole. . MRI performed on 03/22/2013 shows minimal response of her right breast cancer, still 4.5 cm.  Dr. Welton Flakes has recommended proceeding with surgery.  On 04/15/2013 she underwent right modified radical mastectomy. She is recovering uneventfully. After reviewing the pathology Dr. Drue Second as well as chemotherapy and request for Port-A-Cath insertion. The patient has been counseled regarding this procedure.Marland Kitchen    Operative Findings:       The port was inserted through the left subclavian vein with a single pass. Fluoroscopy confirmed good positioning of the catheter tip in the superior vena cava near the right atrium.  Procedure in Detail:          Following the induction of a general LMA anesthesia the patient was positioned with a small roll behind her shoulders, and arms tucked at her sides. The neck and chest were  prepped and draped in a sterile fashion, intravenous antibiotics were given, and a surgical time out was performed. 1% Xylocaine with epinephrine was used as local infiltration anesthetic. A left subclavian venipuncture was performed with a  single pass and a guidewire inserted into the superior vena cava under fluoroscopic guidance. Using the fluoroscope as a guide we marked a template on the chest wall for the intended positioning of the catheter in the superior vena cava. A small incision was made at the wire insertion site. About 2 cm below the wire insertion site I made a transverse incision and created a subcutaneous pocket at the level of pectoralis fascia. Using a tunneling device I drew the catheter from the port pocket incision to the wire insertion site. Using the template as a guide I positioned the catheter where it should be on the skin and cut it 22-1/2 cm in length. The catheter was then secured to the port with the locking device and flushed with heparin. The port was sutured to the pectoralis fascia with 3 interrupted sutures of 2-0 Prolene. The dilator and peel-away sheath were inserted over the guidewire into the central venous circulation. The dilator and wire were removed and the  catheter was inserted and the peel-away sheath removed. The catheter flushed easily and had excellent blood return. The catheter was flushed with concentrated heparin. Fluoroscopy was then performed and confirmed confirmed good positioning of the catheter tip in the superior vena cava and with no deformity of the catheter anywhere along its course. Subcutaneous tissue was closed with 3-0 Vicryl sutures and the skin incisions closed with subcuticular sutures of 4-0 Monocryl and Dermabond. The patient tolerated the procedure well was taken to recovery in stable condition where chest x-ray will be obtained. Counts correct. EBL 10 cc. Complications none.     Angelia Mould. Derrell Lolling, M.D., FACS General and Minimally  Invasive Surgery Breast and Colorectal Surgery  05/13/2013 8:24 AM

## 2013-05-13 NOTE — Interval H&P Note (Signed)
History and Physical Interval Note:  05/13/2013 7:02 AM  Mary Young  has presented today for surgery, with the diagnosis of breast cancer  The goals and the various methods of treatment have been discussed with the patient and family. After consideration of risks, benefits and other options for treatment, the patient has consented to  Procedure(s): INSERTION PORT-A-CATH (N/A) as a surgical intervention .  The patient's history has been reviewed, patient examined, no change in status, stable for surgery.  I have reviewed the patient's chart and labs.  Questions were answered to the patient's satisfaction.     Ernestene Mention

## 2013-05-13 NOTE — Preoperative (Signed)
Beta Blockers   Reason not to administer Beta Blockers:Not Applicable 

## 2013-05-13 NOTE — Addendum Note (Signed)
Addendum created 05/13/13 0843 by Rudi Rummage, CRNA   Modules edited: Anesthesia Medication Administration

## 2013-05-13 NOTE — Transfer of Care (Signed)
Immediate Anesthesia Transfer of Care Note  Patient: Mary Young  Procedure(s) Performed: Procedure(s): INSERTION PORT-A-CATH (Left)  Patient Location: PACU  Anesthesia Type:General  Level of Consciousness: awake  Airway & Oxygen Therapy: Patient Spontanous Breathing and Patient connected to nasal cannula oxygen  Post-op Assessment: Report given to PACU RN and Post -op Vital signs reviewed and stable  Post vital signs: Reviewed and stable  Complications: No apparent anesthesia complications

## 2013-05-13 NOTE — Anesthesia Preprocedure Evaluation (Signed)

## 2013-05-13 NOTE — Anesthesia Postprocedure Evaluation (Signed)
  Anesthesia Post-op Note  Patient: Mary Young  Procedure(s) Performed: Procedure(s): INSERTION PORT-A-CATH (Left)  Patient Location: PACU  Anesthesia Type:General  Level of Consciousness: awake, alert  and oriented  Airway and Oxygen Therapy: Patient Spontanous Breathing and Patient connected to nasal cannula oxygen  Post-op Pain: none  Post-op Assessment: Post-op Vital signs reviewed  Post-op Vital Signs: Reviewed  Complications: No apparent anesthesia complications

## 2013-05-16 ENCOUNTER — Encounter (HOSPITAL_COMMUNITY): Payer: Self-pay | Admitting: General Surgery

## 2013-05-16 ENCOUNTER — Telehealth (INDEPENDENT_AMBULATORY_CARE_PROVIDER_SITE_OTHER): Payer: Self-pay | Admitting: General Surgery

## 2013-05-16 ENCOUNTER — Telehealth: Payer: Self-pay | Admitting: Oncology

## 2013-05-16 MED ORDER — ONDANSETRON HCL 8 MG PO TABS: 8.0000 mg | ORAL_TABLET | Freq: Two times a day (BID) | ORAL | Status: DC

## 2013-05-16 MED ORDER — PROCHLORPERAZINE MALEATE 10 MG PO TABS: 10.0000 mg | ORAL_TABLET | Freq: Four times a day (QID) | ORAL | Status: DC | PRN

## 2013-05-16 MED ORDER — PROCHLORPERAZINE 25 MG RE SUPP: 25.0000 mg | Freq: Two times a day (BID) | RECTAL | Status: DC | PRN

## 2013-05-16 MED ORDER — PROMETHAZINE HCL 25 MG PO TABS: 25.0000 mg | ORAL_TABLET | ORAL | Status: DC | PRN

## 2013-05-16 MED ORDER — LORAZEPAM 0.5 MG PO TABS: 0.5000 mg | ORAL_TABLET | Freq: Four times a day (QID) | ORAL | Status: DC | PRN

## 2013-05-16 NOTE — Progress Notes (Signed)
OFFICE PROGRESS NOTE  CC Dr. Maurice Small Dr. Claud Kelp Dr. 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 Patient had a lymph node enlarged there was biopsied and was consistent with metastatic ductal carcinoma. Diagnosed October 2013.  PRIOR THERAPY:  #1 patient underwent a screening mammogram in September 2013 that showed a right breast mass with associated calcifications. On MRI the mass measured 4.7 x 3.6 x 5.2 cm lymph node measuring 1.5 x 1.0 x 2.0 cm. Patient had a needle core biopsy performed that showed invasive mammary carcinoma ER positive PR positive HER-2/neu negative with Ki-67 elevated at 66%. The lymph node was biopsied and was consistent with metastatic ductal carcinoma.  #2 patient was seen in the multidisciplinary breast clinic for discussion of treatment options. She had a Oncotype DX testing performed on the biopsied tissue her recurrence score was 28 giving her 18% risk of distant recurrence with antiestrogen therapy. She was in the intermediate risk category. Patient was enrolled on neoadjuvant antiestrogen treatment clinical trial.  #3 she is currently receiving letrozole 2.5 mg starting in November 2013. She had ultrasound of the right breast performed that showed reduction in the size of the lymph nodes.  #4 patient also has prior history of left lumpectomy and radiation for DCIS treated in 2003. This tumor was apparently ER and PR negative so she did not receive adjuvant tamoxifen.  #5 patient is now status post right modified radical mastectomy with axillary lymph node dissection. The final pathology revealed 4.3 cm invasive ductal carcinoma 5 lymph nodes were positive for metastatic disease. Tumor ER +100% PR negative Ki-67 66%. Pathologic staging T2 N2. Patient's case was discussed at the multidisciplinary breast conference. Adjuvant  chemotherapy was recommended. I have spoken to the patient and I recommended either doing Taxotere Cytoxan every 3 weeks. However due Tuesday road usage patient may not be a good candidate for this. We therefore discussed the possibility of doing CMF every 21 days for a total of 6 cycles. Once she completes the chemotherapy then she will proceed to postmastectomy radiation therapy.  CURRENT THERAPY:   INTERVAL HISTORY: Mary Young 72 y.o. female returns for followup visit today. Patient returns in followup after having her mastectomy performed. She and I and her husband discussed the final pathology results. I have discussed my recommendations with them as well. Clinically she seems to be doing well and is recovering from the mastectomy. She has no nausea or vomiting no fevers or chills. She does have some pain and tenderness. Remainder of the review of systems is negative. MEDICAL HISTORY: Past Medical History  Diagnosis Date  . Breast cancer     left lumpectomy  . Hypertension   . Glaucoma   . Central retinal vein occlusion   . Joint pain   . Dyslipidemia   . H/O echocardiogram 09/27/2009    Note to have some aortic valve sclerosis, mild mitral valve prolapse, mild mitral regurgitation, and mild asymptomatic LVH with an EF>55%  . History of stress test 09/27/2009    Normal Myocardial perfusion study. No significant ischemia demonstrated, this low risk scan. compared to the previous study.,there is no significant change, Normal myocardial Perfusion imaging. EF>83%  . Seasonal allergies     ALLERGIES:  is allergic to percocet; precipitated sulfur; and timolol.  MEDICATIONS:  Current Outpatient Prescriptions  Medication Sig Dispense Refill  . aspirin EC 81 MG tablet  Take 81 mg by mouth daily.      . B Complex-C (SUPER B COMPLEX PO) Take 1 tablet by mouth daily.       . Calcium-Vitamin D-Vitamin K 500-100-40 MG-UNT-MCG CHEW Chew 1 tablet by mouth daily.       . dorzolamide  (TRUSOPT) 2 % ophthalmic solution Place 1 drop into both eyes 2 (two) times daily.       Marland Kitchen letrozole (FEMARA) 2.5 MG tablet Take 1 tablet (2.5 mg total) by mouth daily.  30 tablet  11  . Multiple Vitamin (MULTIVITAMIN WITH MINERALS) TABS Take 1 tablet by mouth daily.      . simvastatin (ZOCOR) 20 MG tablet Take 20 mg by mouth every other day.       . timolol (TIMOPTIC) 0.5 % ophthalmic solution Place 1 drop into both eyes 2 (two) times daily. Non preservative      . TRAVATAN Z 0.004 % SOLN ophthalmic solution Place 1 drop into both eyes at bedtime.       . vitamin E 400 UNIT capsule Take 400 Units by mouth daily.      Marland Kitchen HYDROcodone-acetaminophen (NORCO/VICODIN) 5-325 MG per tablet Take 1-2 tablets by mouth every 4 (four) hours as needed for pain.  30 tablet  1  . LORazepam (ATIVAN) 0.5 MG tablet Take 1 tablet (0.5 mg total) by mouth every 6 (six) hours as needed (Nausea or vomiting).  30 tablet  0  . losartan (COZAAR) 50 MG tablet Take 50 mg by mouth daily.      . ondansetron (ZOFRAN) 8 MG tablet Take 1 tablet (8 mg total) by mouth 2 (two) times daily. Take two times a day starting the day after chemo for 2 days. Then take two times a day as needed for nausea or vomiting.  30 tablet  1  . prochlorperazine (COMPAZINE) 10 MG tablet Take 1 tablet (10 mg total) by mouth every 6 (six) hours as needed (Nausea or vomiting).  30 tablet  1  . prochlorperazine (COMPAZINE) 25 MG suppository Place 1 suppository (25 mg total) rectally every 12 (twelve) hours as needed for nausea.  12 suppository  3  . promethazine (PHENERGAN) 25 MG tablet Take 1 tablet (25 mg total) by mouth every 4 (four) hours as needed for nausea.  10 tablet  0   No current facility-administered medications for this visit.    SURGICAL HISTORY:  Past Surgical History  Procedure Laterality Date  . Abdominal hysterectomy  1991  . Breast lumpectomy  2003  . Arthroscopy knee w/ drilling  2005/2008    Left knee/Right knee  . Tubal ligation   1983  . Mastectomy modified radical Right 04/15/2013    Procedure: MASTECTOMY MODIFIED RADICAL;  Surgeon: Ernestene Mention, MD;  Location: [facility] OR;  Service: General;  Laterality: Right;  . Portacath placement Left 05/13/2013    Procedure: INSERTION PORT-A-CATH;  Surgeon: Ernestene Mention, MD;  Location: [facility] OR;  Service: General;  Laterality: Left;    REVIEW OF SYSTEMS:  Pertinent items are noted in HPI.   HEALTH MAINTENANCE:   PHYSICAL EXAMINATION: Blood pressure 134/70, pulse 70, temperature 98.3 F (36.8 C), temperature source Oral, resp. rate 20, height 5' 3.5" (1.613 m), weight 129 lb 3.2 oz (58.605 kg). Body mass index is 22.53 kg/(m^2). ECOG PERFORMANCE STATUS: 0 - Asymptomatic   General appearance: alert, cooperative and appears stated age Lymph nodes: Cervical, supraclavicular, and axillary nodes normal. Resp: clear to auscultation bilaterally Back: symmetric, no curvature.  ROM normal. No CVA tenderness. Cardio: regular rate and rhythm GI: soft, non-tender; bowel sounds normal; no masses,  no organomegaly Extremities: extremities normal, atraumatic, no cyanosis or edema Neurologic: Grossly normal Left breast: Well-healed surgical scar in the inner lower quadrant. Right breast reveals a palpable mass at the 10:00 position measuring about 4-5 cm. No nipple discharge no other masses  LABORATORY DATA: Lab Results  Component Value Date   WBC 5.4 05/11/2013   HGB 13.5 05/11/2013   HCT 38.7 05/11/2013   MCV 91.1 05/11/2013   PLT 227 05/11/2013      Chemistry      Component Value Date/Time   NA 143 05/11/2013 0940   NA 144 03/22/2013 0811   K 3.8 05/11/2013 0940   K 4.0 03/22/2013 0811   CL 106 05/11/2013 0940   CL 107 03/22/2013 0811   CO2 27 05/11/2013 0940   CO2 29 03/22/2013 0811   BUN 14 05/11/2013 0940   BUN 12.4 03/22/2013 0811   CREATININE 0.60 05/11/2013 0940   CREATININE 0.7 03/22/2013 0811      Component Value Date/Time   CALCIUM 9.2 05/11/2013 0940   CALCIUM 9.0 03/22/2013 0811    ALKPHOS 69 04/08/2013 0942   ALKPHOS 70 03/22/2013 0811   AST 22 04/08/2013 0942   AST 18 03/22/2013 0811   ALT 20 04/08/2013 0942   ALT 16 03/22/2013 0811   BILITOT 0.6 04/08/2013 0942   BILITOT 0.72 03/22/2013 0811     ADDITIONAL INFORMATION: PROGNOSTIC INDICATORS - ACIS Results: IMMUNOHISTOCHEMICAL AND MORPHOMETRIC ANALYSIS BY THE AUTOMATED CELLULAR IMAGING SYSTEM (ACIS) Estrogen Receptor: 100%, POSITIVE, STRONG STAINING INTENSITY Progesterone Receptor: 0%, NEGATIVE COMMENT: The negative hormone receptor study in this case has no internal positive control. REFERENCE RANGE ESTROGEN RECEPTOR NEGATIVE <1% POSITIVE =>1% PROGESTERONE RECEPTOR NEGATIVE <1% POSITIVE =>1% All controls stained appropriately Jimmy Picket MD Pathologist, Electronic Signature ( Signed 04/22/2013) CHROMOGENIC IN-SITU HYBRIDIZATION Results: HER-2/NEU BY CISH - NO AMPLIFICATION OF HER-2 DETECTED. RESULT RATIO OF HER2: CEP 17 SIGNALS 0.91 AVERAGE HER2 COPY NUMBER PER CELL 2.15 1 of 4 FINAL for SAKIA, SCHRIMPF ([id number]) ADDITIONAL INFORMATION:(continued) REFERENCE RANGE NEGATIVE HER2/Chr17 Ratio <2.0 and Average HER2 copy number <4.0 EQUIVOCAL HER2/Chr17 Ratio <2.0 and Average HER2 copy number 4.0 and <6.0 POSITIVE HER2/Chr17 Ratio >=2.0 and/or Average HER2 copy number >=6.0 Jimmy Picket MD Pathologist, Electronic Signature ( Signed 04/22/2013) FINAL DIAGNOSIS Diagnosis Breast, modified radical mastectomy , Right and axillary contents - RESIDUAL INVASIVE DUCAL CARCINOMA, NOTTINGHAM COMBINED HISTOLOGIC GRADE II, 4.3 CM - INTERMEDIATE GRADE DUCTAL CARCINOMA IN SITU. - ANGIOLYMPHATIC INVASION PRESENT. - EXTENSIVE STROMAL FIBROSIS, CONSISTENT WITH POST-TREATMENT EFFECT. - FIVE OF TWENTY-THREE LYMPH NODES, POSITIVE FOR METASTATIC CARCINOMA (5/23). - RESECTION MARGINS, NEGATIVE FOR ATYPIA OR MALIGNANCY. - PLEASE SEE ONCOLOGY TEMPLATE FOR DETAILS. Microscopic Comment BREAST, INVASIVE TUMOR, WITH  LYMPH NODE SAMPLING Specimen, including laterality: Right breast and axillary lymph nodes Procedure: Modified radical mastectomy with lymph node dissection Grade: II Tubule formation: 2 Nuclear pleomorphism: 2 Mitotic: 1 Tumor size (gross measurement): 4.3 cm (clusters of tumor cells contiguously spread throughout the sections; dense stromal fibrosis in the background, consistent with post-treatment effect) Margins: Negative Invasive, distance to closest margin: Deep margin: more than 1 cm In-situ, distance to closest margin: Deep margin: more then 1 cm Lymphovascular invasion: Present Ductal carcinoma in situ: Present Grade: Intermediate grade Extensive intraductal component: No Lobular neoplasia: N/A Tumor focality: Unifocal Treatment effect: Present, only in the breast tissue Extent of tumor: Skin: No Nipple: No Skeletal muscle:  N/A Lymph nodes: # examined: 23 Lymph nodes with metastasis: 5 2 of 4 FINAL for JISELE, PRICE ([id number]) Microscopic Comment(continued) Isolated tumor cells (< 0.2 mm): 0 Micrometastasis: (> 0.2 mm and < 2.0 mm): 0 Macrometastasis: (> 2.0 mm): 5 Extracapsular extension: No Breast prognostic profile: Please correlate with previous case [phone] Estrogen receptor: 100%, positive, strong staining intensity. Progesterone receptor: 96%, positive, strong staining intensity. Her 2 neu: No amplification, the ratio of Her 2:CEP 17 signals was 0.86 Ki-67: 66%; The breast prognostic profile will be repeated and an addendum report will follow. Non-neoplastic breast: Dense stromal fibrosis, consistent with post-treatment effect. TNM: ypT2, ypN2a Comments: A breast prognostic profile will be repeated and an addendum report will follow. Sections show invasive ductal carcinoma with micropapillary lobular features. Immunohistochemical stain for E-Cadherin was performed an the tumor cells are strongly positive for E-Cadherin. The tumor is contiguously  spreading throughout the sections with associated fibrotic stroma in the background; the morphologic features are mostly consistent with post-treatment effect. (HCL:kh 04-18-13) Abigail Miyamoto MD Pathologist, Electronic Signature (Case signed 04/20/2013) Specimen Gross and Clinical Information   RADIOGRAPHIC STUDIES:  No results found.  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 The tumor was ER positive PR negative HER-2/neu negative with an elevated Ki-67 of 66%. She is on neoadjuvant antiestrogen therapy consisting of letrozole 2.5 mg. She had an Oncotype DX testing performed on her biopsy tissue that put her in the intermediate risk category.   #2 patient is now status post right modified radical mastectomy with axillary lymph node dissection. She does have significant residual disease measuring 4.3 cm which is ER positive PR negative HER-2/neu negative. 5 of 23 lymph nodes were positive for metastatic disease. We discussed the pathology today  #2 patient with prior history of left breast cancer which was DCIS she underwent a lumpectomy followed by radiation.   PLAN:  #1 I have recommended chemotherapy consisting of CMF to be given every 3 weeks. We cannot use other agents do to her comorbidities and the use of steroids. Corticosteroid usage is contraindicated in patients do to her primary eye issues.  #2 she will need a Port-A-Cath placed and I have asked Dr. Claud Kelp to do this for her.  #3 my plan is to see her back on 05/24/2013 and to begin her chemotherapy at that time.   All questions were answered. The patient knows to call the clinic with any problems, questions or concerns. We can certainly see the patient much sooner if necessary.  I spent 25 minutes counseling the patient face to face. The total time spent in the appointment was 30 minutes.    Drue Second,  MD Medical/Oncology [facility] [phone] (beeper) [phone] (Office)

## 2013-05-16 NOTE — Telephone Encounter (Signed)
Patient's husband called status post patient having PAC inserted on Friday. He called this morning. Patient took her vicodin on Saturday night and Sunday morning. Since taking this medication on Sunday morning she has had nausea and vomiting. She is having no other symptoms. No fever. No shortness of breath. No nausea/vomiting. No pain. She just can't keep anything down. Dr Derrell Lolling paged. Per Dr Derrell Lolling - should not still be having a reaction after 24 hours with no pain medication but can call in Phenergan and patient to contact PCP if no better in 24 hours for evaluation. Patient's husband made aware and medication e-scribed to patient's pharmacy.

## 2013-05-17 ENCOUNTER — Other Ambulatory Visit: Payer: Self-pay | Admitting: Emergency Medicine

## 2013-05-17 ENCOUNTER — Telehealth: Payer: Self-pay | Admitting: *Deleted

## 2013-05-17 NOTE — Telephone Encounter (Signed)
Per staff message and POF I have scheduled appts. No room on 7/8 for treatment until late afternoon. Message sent back to scheduler.  JMW

## 2013-05-17 NOTE — Telephone Encounter (Signed)
[place]ake 

## 2013-05-17 NOTE — Telephone Encounter (Signed)
Patient called and moved her injection appt to earlier   [facility]

## 2013-05-18 ENCOUNTER — Telehealth: Payer: Self-pay | Admitting: Oncology

## 2013-05-18 ENCOUNTER — Other Ambulatory Visit: Payer: Self-pay | Admitting: Emergency Medicine

## 2013-05-18 DIAGNOSIS — C50411 Malignant neoplasm of upper-outer quadrant of right female breast: Secondary | ICD-10-CM

## 2013-05-18 DIAGNOSIS — C50911 Malignant neoplasm of unspecified site of right female breast: Secondary | ICD-10-CM

## 2013-05-18 MED ORDER — LIDOCAINE-PRILOCAINE 2.5-2.5 % EX CREA: TOPICAL_CREAM | CUTANEOUS | Status: DC | PRN

## 2013-05-18 MED ORDER — ONDANSETRON HCL 8 MG PO TABS: 8.0000 mg | ORAL_TABLET | ORAL | Status: DC

## 2013-05-18 NOTE — Telephone Encounter (Signed)
Instructed patient to stop the Letrozole on 05/21/13 per Dr Welton Flakes.  Went over nausea meds and EMLA cream instructions with patient. Patient verbalized understanding. Instructed patient to call with any concerns.

## 2013-05-23 ENCOUNTER — Ambulatory Visit (HOSPITAL_BASED_OUTPATIENT_CLINIC_OR_DEPARTMENT_OTHER): Payer: Medicare Other

## 2013-05-23 ENCOUNTER — Ambulatory Visit (HOSPITAL_BASED_OUTPATIENT_CLINIC_OR_DEPARTMENT_OTHER): Payer: Medicare Other | Admitting: Oncology

## 2013-05-23 ENCOUNTER — Other Ambulatory Visit: Payer: Medicare Other

## 2013-05-23 ENCOUNTER — Other Ambulatory Visit (HOSPITAL_BASED_OUTPATIENT_CLINIC_OR_DEPARTMENT_OTHER): Payer: Medicare Other | Admitting: Lab

## 2013-05-23 ENCOUNTER — Encounter: Payer: Self-pay | Admitting: Oncology

## 2013-05-23 VITALS — BP 164/81 | HR 65 | Temp 98.2°F | Resp 20 | Ht 63.5 in | Wt 129.5 lb

## 2013-05-23 DIAGNOSIS — Z853 Personal history of malignant neoplasm of breast: Secondary | ICD-10-CM

## 2013-05-23 DIAGNOSIS — C773 Secondary and unspecified malignant neoplasm of axilla and upper limb lymph nodes: Secondary | ICD-10-CM

## 2013-05-23 DIAGNOSIS — C50911 Malignant neoplasm of unspecified site of right female breast: Secondary | ICD-10-CM

## 2013-05-23 DIAGNOSIS — C50411 Malignant neoplasm of upper-outer quadrant of right female breast: Secondary | ICD-10-CM

## 2013-05-23 DIAGNOSIS — C50419 Malignant neoplasm of upper-outer quadrant of unspecified female breast: Secondary | ICD-10-CM

## 2013-05-23 DIAGNOSIS — Z170 Estrogen receptor positive status [ER+]: Secondary | ICD-10-CM

## 2013-05-23 DIAGNOSIS — Z5111 Encounter for antineoplastic chemotherapy: Secondary | ICD-10-CM

## 2013-05-23 LAB — CBC WITH DIFFERENTIAL/PLATELET
BASO%: 0.4 % (ref 0.0–2.0)
Basophils Absolute: 0 10*3/uL (ref 0.0–0.1)
EOS%: 3.5 % (ref 0.0–7.0)
Eosinophils Absolute: 0.2 10*3/uL (ref 0.0–0.5)
HCT: 36.9 % (ref 34.8–46.6)
HGB: 12.7 g/dL (ref 11.6–15.9)
LYMPH%: 39.9 % (ref 14.0–49.7)
MCH: 31.4 pg (ref 25.1–34.0)
MCHC: 34.4 g/dL (ref 31.5–36.0)
MCV: 91.3 fL (ref 79.5–101.0)
MONO#: 0.2 10*3/uL (ref 0.1–0.9)
MONO%: 4.5 % (ref 0.0–14.0)
NEUT#: 2.6 10*3/uL (ref 1.5–6.5)
NEUT%: 51.7 % (ref 38.4–76.8)
Platelets: 248 10*3/uL (ref 145–400)
RBC: 4.04 10*6/uL (ref 3.70–5.45)
RDW: 12 % (ref 11.2–14.5)
WBC: 5.1 10*3/uL (ref 3.9–10.3)
lymph#: 2 10*3/uL (ref 0.9–3.3)
nRBC: 0 % (ref 0–0)

## 2013-05-23 LAB — COMPREHENSIVE METABOLIC PANEL (CC13)
ALT: 17 U/L (ref 0–55)
AST: 17 U/L (ref 5–34)
Albumin: 3.5 g/dL (ref 3.5–5.0)
Alkaline Phosphatase: 77 U/L (ref 40–150)
BUN: 13.9 mg/dL (ref 7.0–26.0)
CO2: 29 mEq/L (ref 22–29)
Calcium: 9.5 mg/dL (ref 8.4–10.4)
Chloride: 107 mEq/L (ref 98–109)
Creatinine: 0.7 mg/dL (ref 0.6–1.1)
Glucose: 85 mg/dl (ref 70–140)
Potassium: 3.9 mEq/L (ref 3.5–5.1)
Sodium: 142 mEq/L (ref 136–145)
Total Bilirubin: 0.41 mg/dL (ref 0.20–1.20)
Total Protein: 6.4 g/dL (ref 6.4–8.3)

## 2013-05-23 MED ORDER — METHOTREXATE SODIUM CHEMO INJECTION 25 MG/ML
40.0000 mg/m2 | Freq: Once | INTRAMUSCULAR | Status: AC
  Administered 2013-05-23: 65 mg via INTRAVENOUS
  Filled 2013-05-23: qty 2.6

## 2013-05-23 MED ORDER — SODIUM CHLORIDE 0.9 % IJ SOLN
10.0000 mL | INTRAMUSCULAR | Status: DC | PRN
  Administered 2013-05-23: 10 mL
  Filled 2013-05-23: qty 10

## 2013-05-23 MED ORDER — HEPARIN SOD (PORK) LOCK FLUSH 100 UNIT/ML IV SOLN
500.0000 [IU] | Freq: Once | INTRAVENOUS | Status: AC | PRN
  Administered 2013-05-23: 500 [IU]
  Filled 2013-05-23: qty 5

## 2013-05-23 MED ORDER — FLUOROURACIL CHEMO INJECTION 2.5 GM/50ML
600.0000 mg/m2 | Freq: Once | INTRAVENOUS | Status: AC
  Administered 2013-05-23: 1000 mg via INTRAVENOUS
  Filled 2013-05-23: qty 20

## 2013-05-23 MED ORDER — ONDANSETRON 8 MG/50ML IVPB (CHCC)
8.0000 mg | Freq: Once | INTRAVENOUS | Status: AC
  Administered 2013-05-23: 8 mg via INTRAVENOUS

## 2013-05-23 MED ORDER — SODIUM CHLORIDE 0.9 % IV SOLN
600.0000 mg/m2 | Freq: Once | INTRAVENOUS | Status: AC
  Administered 2013-05-23: 1000 mg via INTRAVENOUS
  Filled 2013-05-23: qty 50

## 2013-05-23 MED ORDER — SODIUM CHLORIDE 0.9 % IV SOLN
Freq: Once | INTRAVENOUS | Status: AC
  Administered 2013-05-23: 15:00:00 via INTRAVENOUS

## 2013-05-23 NOTE — Progress Notes (Signed)
OFFICE PROGRESS NOTE  CC Dr. Maurice Small Dr. Claud Kelp Dr. 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 Patient had a lymph node enlarged there was biopsied and was consistent with metastatic ductal carcinoma. Diagnosed October 2013.  PRIOR THERAPY:  #1 patient underwent a screening mammogram in September 2013 that showed a right breast mass with associated calcifications. On MRI the mass measured 4.7 x 3.6 x 5.2 cm lymph node measuring 1.5 x 1.0 x 2.0 cm. Patient had a needle core biopsy performed that showed invasive mammary carcinoma ER positive PR positive HER-2/neu negative with Ki-67 elevated at 66%. The lymph node was biopsied and was consistent with metastatic ductal carcinoma.  #2 patient was seen in the multidisciplinary breast clinic for discussion of treatment options. She had a Oncotype DX testing performed on the biopsied tissue her recurrence score was 28 giving her 18% risk of distant recurrence with antiestrogen therapy. She was in the intermediate risk category. Patient was enrolled on neoadjuvant antiestrogen treatment clinical trial.  #3 she is currently receiving letrozole 2.5 mg starting in November 2013. She had ultrasound of the right breast performed that showed reduction in the size of the lymph nodes.  #4 patient also has prior history of left lumpectomy and radiation for DCIS treated in 2003. This tumor was apparently ER and PR negative so she did not receive adjuvant tamoxifen.  #5 patient is now status post right modified radical mastectomy with axillary lymph node dissection. The final pathology revealed 4.3 cm invasive ductal carcinoma 5 lymph nodes were positive for metastatic disease. Tumor ER +100% PR negative Ki-67 66%. Pathologic staging T2 N2. Patient's case was discussed at the multidisciplinary breast conference. Adjuvant  chemotherapy was recommended. I have spoken to the patient and I recommended either doing Taxotere Cytoxan every 3 weeks. However due Tuesday road usage patient may not be a good candidate for this. We therefore discussed the possibility of doing CMF every 21 days for a total of 6 cycles. Once she completes the chemotherapy then she will proceed to postmastectomy radiation therapy.  CURRENT THERAPY: CMF every 21 days adjuvantly to begin 05/23/2013  INTERVAL HISTORY: Aneth Schlagel Gullatt 72 y.o. female returns for followup visit today. Clinically patient seems to be doing well she is without any complaints. She denies any nausea vomiting fevers chills night sweats headaches shortness of breath chest pains palpitations no myalgias and arthralgias no peripheral paresthesias. Remainder of the 10 point review of systems is negative.  MEDICAL HISTORY: Past Medical History  Diagnosis Date  . Breast cancer     left lumpectomy  . Hypertension   . Glaucoma   . Central retinal vein occlusion   . Joint pain   . Dyslipidemia   . H/O echocardiogram 09/27/2009    Note to have some aortic valve sclerosis, mild mitral valve prolapse, mild mitral regurgitation, and mild asymptomatic LVH with an EF>55%  . History of stress test 09/27/2009    Normal Myocardial perfusion study. No significant ischemia demonstrated, this low risk scan. compared to the previous study.,there is no significant change, Normal myocardial Perfusion imaging. EF>83%  . Seasonal allergies     ALLERGIES:  is allergic to vicodin; percocet; precipitated sulfur; and timolol.  MEDICATIONS:  Current Outpatient Prescriptions  Medication Sig Dispense Refill  . aspirin EC 81 MG tablet Take 81 mg by mouth daily.      Marland Kitchen B  Complex-C (SUPER B COMPLEX PO) Take 1 tablet by mouth daily.       . Calcium-Vitamin D-Vitamin K 500-100-40 MG-UNT-MCG CHEW Chew 1 tablet by mouth daily.       . dorzolamide (TRUSOPT) 2 % ophthalmic solution Place 1 drop into  both eyes 2 (two) times daily.       Marland Kitchen lidocaine-prilocaine (EMLA) cream Apply topically as needed.  30 g  0  . losartan (COZAAR) 50 MG tablet Take 50 mg by mouth daily.      . Multiple Vitamin (MULTIVITAMIN WITH MINERALS) TABS Take 1 tablet by mouth daily.      . timolol (TIMOPTIC) 0.5 % ophthalmic solution Place 1 drop into both eyes 2 (two) times daily. Non preservative      . TRAVATAN Z 0.004 % SOLN ophthalmic solution Place 1 drop into both eyes at bedtime.       . vitamin E 400 UNIT capsule Take 400 Units by mouth daily.      Marland Kitchen HYDROcodone-acetaminophen (NORCO/VICODIN) 5-325 MG per tablet Take 1-2 tablets by mouth every 4 (four) hours as needed for pain.  30 tablet  1  . letrozole (FEMARA) 2.5 MG tablet Take 1 tablet (2.5 mg total) by mouth daily.  30 tablet  11  . LORazepam (ATIVAN) 0.5 MG tablet Take 1 tablet (0.5 mg total) by mouth every 6 (six) hours as needed (Nausea or vomiting).  30 tablet  0  . ondansetron (ZOFRAN) 8 MG tablet Take 1 tablet (8 mg total) by mouth as directed. Take two times a day starting the day after chemo for 2 days. Then take two times a day as needed for nausea or vomiting.  30 tablet  1  . prochlorperazine (COMPAZINE) 10 MG tablet Take 1 tablet (10 mg total) by mouth every 6 (six) hours as needed (Nausea or vomiting).  30 tablet  1  . prochlorperazine (COMPAZINE) 25 MG suppository Place 1 suppository (25 mg total) rectally every 12 (twelve) hours as needed for nausea.  12 suppository  3  . promethazine (PHENERGAN) 25 MG tablet Take 1 tablet (25 mg total) by mouth every 4 (four) hours as needed for nausea.  10 tablet  0  . simvastatin (ZOCOR) 20 MG tablet Take 20 mg by mouth every other day.        No current facility-administered medications for this visit.    SURGICAL HISTORY:  Past Surgical History  Procedure Laterality Date  . Abdominal hysterectomy  1991  . Breast lumpectomy  2003  . Arthroscopy knee w/ drilling  2005/2008    Left knee/Right knee  .  Tubal ligation  1983  . Mastectomy modified radical Right 04/15/2013    Procedure: MASTECTOMY MODIFIED RADICAL;  Surgeon: Ernestene Mention, MD;  Location: [facility] OR;  Service: General;  Laterality: Right;  . Portacath placement Left 05/13/2013    Procedure: INSERTION PORT-A-CATH;  Surgeon: Ernestene Mention, MD;  Location: [facility] OR;  Service: General;  Laterality: Left;    REVIEW OF SYSTEMS:  Pertinent items are noted in HPI.   HEALTH MAINTENANCE:   PHYSICAL EXAMINATION: Blood pressure 164/81, pulse 65, temperature 98.2 F (36.8 C), temperature source Oral, resp. rate 20, height 5' 3.5" (1.613 m), weight 129 lb 8 oz (58.741 kg). Body mass index is 22.58 kg/(m^2). ECOG PERFORMANCE STATUS: 0 - Asymptomatic   General appearance: alert, cooperative and appears stated age Lymph nodes: Cervical, supraclavicular, and axillary nodes normal. Resp: clear to auscultation bilaterally Back: symmetric, no curvature. ROM  normal. No CVA tenderness. Cardio: regular rate and rhythm GI: soft, non-tender; bowel sounds normal; no masses,  no organomegaly Extremities: extremities normal, atraumatic, no cyanosis or edema Neurologic: Grossly normal Left breast: Well-healed surgical scar in the inner lower quadrant. Right breast reveals a palpable mass at the 10:00 position measuring about 4-5 cm. No nipple discharge no other masses  LABORATORY DATA: Lab Results  Component Value Date   WBC 5.1 05/23/2013   HGB 12.7 05/23/2013   HCT 36.9 05/23/2013   MCV 91.3 05/23/2013   PLT 248 05/23/2013      Chemistry      Component Value Date/Time   NA 143 05/11/2013 0940   NA 144 03/22/2013 0811   K 3.8 05/11/2013 0940   K 4.0 03/22/2013 0811   CL 106 05/11/2013 0940   CL 107 03/22/2013 0811   CO2 27 05/11/2013 0940   CO2 29 03/22/2013 0811   BUN 14 05/11/2013 0940   BUN 12.4 03/22/2013 0811   CREATININE 0.60 05/11/2013 0940   CREATININE 0.7 03/22/2013 0811      Component Value Date/Time   CALCIUM 9.2 05/11/2013 0940   CALCIUM 9.0  03/22/2013 0811   ALKPHOS 69 04/08/2013 0942   ALKPHOS 70 03/22/2013 0811   AST 22 04/08/2013 0942   AST 18 03/22/2013 0811   ALT 20 04/08/2013 0942   ALT 16 03/22/2013 0811   BILITOT 0.6 04/08/2013 0942   BILITOT 0.72 03/22/2013 0811     ADDITIONAL INFORMATION: PROGNOSTIC INDICATORS - ACIS Results: IMMUNOHISTOCHEMICAL AND MORPHOMETRIC ANALYSIS BY THE AUTOMATED CELLULAR IMAGING SYSTEM (ACIS) Estrogen Receptor: 100%, POSITIVE, STRONG STAINING INTENSITY Progesterone Receptor: 0%, NEGATIVE COMMENT: The negative hormone receptor study in this case has no internal positive control. REFERENCE RANGE ESTROGEN RECEPTOR NEGATIVE <1% POSITIVE =>1% PROGESTERONE RECEPTOR NEGATIVE <1% POSITIVE =>1% All controls stained appropriately Jimmy Picket MD Pathologist, Electronic Signature ( Signed 04/22/2013) CHROMOGENIC IN-SITU HYBRIDIZATION Results: HER-2/NEU BY CISH - NO AMPLIFICATION OF HER-2 DETECTED. RESULT RATIO OF HER2: CEP 17 SIGNALS 0.91 AVERAGE HER2 COPY NUMBER PER CELL 2.15 1 of 4 FINAL for PANDORA, MCCRACKIN ([id number]) ADDITIONAL INFORMATION:(continued) REFERENCE RANGE NEGATIVE HER2/Chr17 Ratio <2.0 and Average HER2 copy number <4.0 EQUIVOCAL HER2/Chr17 Ratio <2.0 and Average HER2 copy number 4.0 and <6.0 POSITIVE HER2/Chr17 Ratio >=2.0 and/or Average HER2 copy number >=6.0 Jimmy Picket MD Pathologist, Electronic Signature ( Signed 04/22/2013) FINAL DIAGNOSIS Diagnosis Breast, modified radical mastectomy , Right and axillary contents - RESIDUAL INVASIVE DUCAL CARCINOMA, NOTTINGHAM COMBINED HISTOLOGIC GRADE II, 4.3 CM - INTERMEDIATE GRADE DUCTAL CARCINOMA IN SITU. - ANGIOLYMPHATIC INVASION PRESENT. - EXTENSIVE STROMAL FIBROSIS, CONSISTENT WITH POST-TREATMENT EFFECT. - FIVE OF TWENTY-THREE LYMPH NODES, POSITIVE FOR METASTATIC CARCINOMA (5/23). - RESECTION MARGINS, NEGATIVE FOR ATYPIA OR MALIGNANCY. - PLEASE SEE ONCOLOGY TEMPLATE FOR DETAILS. Microscopic Comment BREAST, INVASIVE  TUMOR, WITH LYMPH NODE SAMPLING Specimen, including laterality: Right breast and axillary lymph nodes Procedure: Modified radical mastectomy with lymph node dissection Grade: II Tubule formation: 2 Nuclear pleomorphism: 2 Mitotic: 1 Tumor size (gross measurement): 4.3 cm (clusters of tumor cells contiguously spread throughout the sections; dense stromal fibrosis in the background, consistent with post-treatment effect) Margins: Negative Invasive, distance to closest margin: Deep margin: more than 1 cm In-situ, distance to closest margin: Deep margin: more then 1 cm Lymphovascular invasion: Present Ductal carcinoma in situ: Present Grade: Intermediate grade Extensive intraductal component: No Lobular neoplasia: N/A Tumor focality: Unifocal Treatment effect: Present, only in the breast tissue Extent of tumor: Skin: No Nipple: No Skeletal muscle: N/A  Lymph nodes: # examined: 23 Lymph nodes with metastasis: 5 2 of 4 FINAL for MARCHETTA, NAVRATIL ([id number]) Microscopic Comment(continued) Isolated tumor cells (< 0.2 mm): 0 Micrometastasis: (> 0.2 mm and < 2.0 mm): 0 Macrometastasis: (> 2.0 mm): 5 Extracapsular extension: No Breast prognostic profile: Please correlate with previous case [phone] Estrogen receptor: 100%, positive, strong staining intensity. Progesterone receptor: 96%, positive, strong staining intensity. Her 2 neu: No amplification, the ratio of Her 2:CEP 17 signals was 0.86 Ki-67: 66%; The breast prognostic profile will be repeated and an addendum report will follow. Non-neoplastic breast: Dense stromal fibrosis, consistent with post-treatment effect. TNM: ypT2, ypN2a Comments: A breast prognostic profile will be repeated and an addendum report will follow. Sections show invasive ductal carcinoma with micropapillary lobular features. Immunohistochemical stain for E-Cadherin was performed an the tumor cells are strongly positive for E-Cadherin. The tumor is  contiguously spreading throughout the sections with associated fibrotic stroma in the background; the morphologic features are mostly consistent with post-treatment effect. (HCL:kh 04-18-13) Abigail Miyamoto MD Pathologist, Electronic Signature (Case signed 04/20/2013) Specimen Gross and Clinical Information   RADIOGRAPHIC STUDIES:  No results found.  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 The tumor was ER positive PR negative HER-2/neu negative with an elevated Ki-67 of 66%. She is on neoadjuvant antiestrogen therapy consisting of letrozole 2.5 mg. She had an Oncotype DX testing performed on her biopsy tissue that put her in the intermediate risk category.   #2 patient is now status post right modified radical mastectomy with axillary lymph node dissection. She does have significant residual disease measuring 4.3 cm which is ER positive PR negative HER-2/neu negative. 5 of 23 lymph nodes were positive for metastatic disease. We discussed the pathology today  #3 patient with prior history of left breast cancer which was DCIS she underwent a lumpectomy followed by radiation.  #4 I have recommended chemotherapy consisting of CMF to be given every 3 weeks. We cannot use other agents do to her comorbidities and the use of steroids. Corticosteroid usage is contraindicated in patients do to her primary eye issues.  PLAN:  #1 patient will proceed with cycle 1 day 1 of CMF today.  #2 she will return in one week's time for followup and labs.  All questions were answered. The patient knows to call the clinic with any problems, questions or concerns. We can certainly see the patient much sooner if necessary.  I spent 25 minutes counseling the patient face to face. The total time spent in the appointment was 30 minutes.    Drue Second, MD Medical/Oncology [facility] [phone] (beeper) [phone] (Office)

## 2013-05-23 NOTE — Patient Instructions (Addendum)
#  1 you will proceed with CMF today. This will be given every 21 days. We discussed the side effects.  #2 he will return in one week's time for followup.  #3 we discussed how to take antinausea medicines. You're not going to be receiving any Decadron at all either with chemotherapy or afterwards to 2 your ophthalmologic diagnosis.  #4 please call with any problems

## 2013-05-23 NOTE — Progress Notes (Signed)
Patient had no reaction with first treatment.

## 2013-05-23 NOTE — Patient Instructions (Addendum)
[facility] Discharge Instructions for Patients Receiving Chemotherapy  Today you received the following chemotherapy agents Methotrexate, Adrucil and Cytoxan.  To help prevent nausea and vomiting after your treatment, we encourage you to take your nausea medication.   If you develop nausea and vomiting that is not controlled by your nausea medication, call the clinic.   BELOW ARE SYMPTOMS THAT SHOULD BE REPORTED IMMEDIATELY:  *FEVER GREATER THAN 100.5 F  *CHILLS WITH OR WITHOUT FEVER  NAUSEA AND VOMITING THAT IS NOT CONTROLLED WITH YOUR NAUSEA MEDICATION  *UNUSUAL SHORTNESS OF BREATH  *UNUSUAL BRUISING OR BLEEDING  TENDERNESS IN MOUTH AND THROAT WITH OR WITHOUT PRESENCE OF ULCERS  *URINARY PROBLEMS  *BOWEL PROBLEMS  UNUSUAL RASH Items with * indicate a potential emergency and should be followed up as soon as possible.  Feel free to call the clinic you have any questions or concerns. The clinic phone number is [phone].

## 2013-05-24 ENCOUNTER — Telehealth: Payer: Self-pay | Admitting: *Deleted

## 2013-05-24 ENCOUNTER — Other Ambulatory Visit: Payer: Medicare Other | Admitting: Lab

## 2013-05-24 ENCOUNTER — Encounter: Payer: Self-pay | Admitting: Oncology

## 2013-05-24 ENCOUNTER — Ambulatory Visit: Payer: Medicare Other | Admitting: Oncology

## 2013-05-24 NOTE — Telephone Encounter (Signed)
PT. HAS HAD SOME NAUSEA BUT HER ANTIEMETIC MEDICATION HELPED. NO VOMITING. PT. IS EATING AND FORCING FLUIDS. NO PROBLEMS WITH PT.'S BOWELS. NO ISSUES WITH PT.'S MOUTH AT THIS TIME. REMINDED PT. THERE IS A PHYSICIAN ON CALL IF THE NEED ARISES. SHE VOICES UNDERSTANDING.

## 2013-05-24 NOTE — Progress Notes (Signed)
Put trip cancellation form on nurse's desk °

## 2013-05-25 ENCOUNTER — Encounter (INDEPENDENT_AMBULATORY_CARE_PROVIDER_SITE_OTHER): Payer: Self-pay | Admitting: General Surgery

## 2013-05-25 ENCOUNTER — Encounter: Payer: Self-pay | Admitting: Oncology

## 2013-05-25 ENCOUNTER — Ambulatory Visit (INDEPENDENT_AMBULATORY_CARE_PROVIDER_SITE_OTHER): Payer: Medicare Other | Admitting: General Surgery

## 2013-05-25 VITALS — BP 118/68 | HR 76 | Temp 97.8°F | Resp 15 | Ht 63.5 in | Wt 127.6 lb

## 2013-05-25 DIAGNOSIS — C50411 Malignant neoplasm of upper-outer quadrant of right female breast: Secondary | ICD-10-CM

## 2013-05-25 DIAGNOSIS — C50419 Malignant neoplasm of upper-outer quadrant of unspecified female breast: Secondary | ICD-10-CM

## 2013-05-25 NOTE — Progress Notes (Signed)
Put trip cancellation form in registration desk.

## 2013-05-25 NOTE — Patient Instructions (Signed)
Your mastectomy wound and your Port-A-Cath wound are healing nicely. There is no sign of infection.  We aspirated 35 cc of clear fluid from the mastectomy wound.  Take it easy and keep an ice pack on the mastectomy wound.  You may go to physical therapy tomorrow  Return to see Dr. Derrell Lolling in 2 weeks to check for fluid.

## 2013-05-25 NOTE — Progress Notes (Signed)
Patient ID: Mary Young, female   DOB: Feb 22, 1941, 72 y.o.   MRN: [id number] History:  This patient initially presented 7 months ago with a large tumor in her right breast, upper outer quadrant. Imaging studies showed a 5.2 cm tumor and a 2.0 cm right axillary lymph node. 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  She has a past history of left partial mastectomy and sentinel node biopsy and radiation therapy in [facility]. His tumor might have been microinvasive, node negative disease. She has no evidence of recurrence there.  She completed a course of neoadjuvant letrozole. . MRI performed on 03/22/2013 shows minimal response of her right breast cancer, still 4.5 cm. Dr. Welton Flakes has recommended proceeding with surgery.  On 04/15/2013 she underwent right modified radical mastectomy. She is recovering uneventfully Except she feels a little bit of fluid under the skin flap. I placed a Port-A-Cath on 05/13/2013. She has started chemotherapy and has done well with her first treatment.  Exam: Patient looks well. Friendly as usual. No distress. Breast: Right mastectomy wound is clean no sign of infection. Small seroma noted, aspirin 35 cc of clear fluid following Betadine prep. Tolerated well. Port left infraclavicular area looks fine. Range of motion right shoulder is actually pretty good. 80-90% of normal.  Assessment: Locally advanced, invasive mammary carcinoma right breast, central and upper outer quadrant, receptor positive, HER-2-negative, pretreatment clinical stage T3,N1;  Final stage ypT2, ypN2. Recovering uneventfully following right modified radical mastectomy Small seroma right mastectomy wound, aspirated today Status post Port-A-Cath insertion, no apparent complications  Plan: Physical therapy to begin tomorrow. I suspect she will do well with this Return to  see me in 2 weeks to make sure she did not reaccumulating fluid Continue with chemotherapy.   Angelia Mould. Derrell Lolling, M.D., [facility] General and Minimally invasive Surgery Breast and Colorectal Surgery Office:   [phone] Pager:   [phone]

## 2013-05-26 ENCOUNTER — Ambulatory Visit: Payer: Medicare Other | Attending: General Surgery | Admitting: Physical Therapy

## 2013-05-26 DIAGNOSIS — M24519 Contracture, unspecified shoulder: Secondary | ICD-10-CM | POA: Insufficient documentation

## 2013-05-26 DIAGNOSIS — C50919 Malignant neoplasm of unspecified site of unspecified female breast: Secondary | ICD-10-CM | POA: Insufficient documentation

## 2013-05-26 DIAGNOSIS — R209 Unspecified disturbances of skin sensation: Secondary | ICD-10-CM | POA: Insufficient documentation

## 2013-05-26 DIAGNOSIS — Z9010 Acquired absence of unspecified breast and nipple: Secondary | ICD-10-CM | POA: Insufficient documentation

## 2013-05-26 DIAGNOSIS — IMO0001 Reserved for inherently not codable concepts without codable children: Secondary | ICD-10-CM | POA: Insufficient documentation

## 2013-05-26 DIAGNOSIS — Z79899 Other long term (current) drug therapy: Secondary | ICD-10-CM | POA: Insufficient documentation

## 2013-05-30 ENCOUNTER — Encounter: Payer: Medicare Other | Admitting: Physical Therapy

## 2013-05-31 ENCOUNTER — Encounter: Payer: Self-pay | Admitting: Oncology

## 2013-05-31 ENCOUNTER — Other Ambulatory Visit: Payer: Self-pay | Admitting: Emergency Medicine

## 2013-05-31 ENCOUNTER — Ambulatory Visit (HOSPITAL_BASED_OUTPATIENT_CLINIC_OR_DEPARTMENT_OTHER): Payer: Medicare Other | Admitting: Oncology

## 2013-05-31 ENCOUNTER — Other Ambulatory Visit (HOSPITAL_BASED_OUTPATIENT_CLINIC_OR_DEPARTMENT_OTHER): Payer: Medicare Other | Admitting: Lab

## 2013-05-31 VITALS — BP 135/70 | HR 69 | Temp 97.5°F | Resp 18 | Ht 63.0 in | Wt 130.1 lb

## 2013-05-31 DIAGNOSIS — C773 Secondary and unspecified malignant neoplasm of axilla and upper limb lymph nodes: Secondary | ICD-10-CM

## 2013-05-31 DIAGNOSIS — C50419 Malignant neoplasm of upper-outer quadrant of unspecified female breast: Secondary | ICD-10-CM

## 2013-05-31 DIAGNOSIS — C50411 Malignant neoplasm of upper-outer quadrant of right female breast: Secondary | ICD-10-CM

## 2013-05-31 DIAGNOSIS — C50911 Malignant neoplasm of unspecified site of right female breast: Secondary | ICD-10-CM

## 2013-05-31 LAB — CBC WITH DIFFERENTIAL/PLATELET
BASO%: 1.2 % (ref 0.0–2.0)
Basophils Absolute: 0 10*3/uL (ref 0.0–0.1)
EOS%: 6.1 % (ref 0.0–7.0)
Eosinophils Absolute: 0.2 10*3/uL (ref 0.0–0.5)
HCT: 34.5 % — ABNORMAL LOW (ref 34.8–46.6)
HGB: 12 g/dL (ref 11.6–15.9)
LYMPH%: 41.7 % (ref 14.0–49.7)
MCH: 32.2 pg (ref 25.1–34.0)
MCHC: 34.7 g/dL (ref 31.5–36.0)
MCV: 92.7 fL (ref 79.5–101.0)
MONO#: 0.1 10*3/uL (ref 0.1–0.9)
MONO%: 3.3 % (ref 0.0–14.0)
NEUT#: 1.6 10*3/uL (ref 1.5–6.5)
NEUT%: 47.7 % (ref 38.4–76.8)
Platelets: 195 10*3/uL (ref 145–400)
RBC: 3.72 10*6/uL (ref 3.70–5.45)
RDW: 11.9 % (ref 11.2–14.5)
WBC: 3.3 10*3/uL — ABNORMAL LOW (ref 3.9–10.3)
lymph#: 1.4 10*3/uL (ref 0.9–3.3)

## 2013-05-31 LAB — COMPREHENSIVE METABOLIC PANEL (CC13)
ALT: 16 U/L (ref 0–55)
AST: 16 U/L (ref 5–34)
Albumin: 3.4 g/dL — ABNORMAL LOW (ref 3.5–5.0)
Alkaline Phosphatase: 80 U/L (ref 40–150)
BUN: 14.3 mg/dL (ref 7.0–26.0)
CO2: 29 mEq/L (ref 22–29)
Calcium: 9.3 mg/dL (ref 8.4–10.4)
Chloride: 108 mEq/L (ref 98–109)
Creatinine: 0.6 mg/dL (ref 0.6–1.1)
Glucose: 103 mg/dl (ref 70–140)
Potassium: 4 mEq/L (ref 3.5–5.1)
Sodium: 145 mEq/L (ref 136–145)
Total Bilirubin: 0.21 mg/dL (ref 0.20–1.20)
Total Protein: 6.1 g/dL — ABNORMAL LOW (ref 6.4–8.3)

## 2013-05-31 NOTE — Progress Notes (Signed)
OFFICE PROGRESS NOTE  CC Dr. Maurice Small Dr. Claud Kelp Dr. 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 Patient had a lymph node enlarged there was biopsied and was consistent with metastatic ductal carcinoma. Diagnosed October 2013.  PRIOR THERAPY:  #1 patient underwent a screening mammogram in September 2013 that showed a right breast mass with associated calcifications. On MRI the mass measured 4.7 x 3.6 x 5.2 cm lymph node measuring 1.5 x 1.0 x 2.0 cm. Patient had a needle core biopsy performed that showed invasive mammary carcinoma ER positive PR positive HER-2/neu negative with Ki-67 elevated at 66%. The lymph node was biopsied and was consistent with metastatic ductal carcinoma.  #2 patient was seen in the multidisciplinary breast clinic for discussion of treatment options. She had a Oncotype DX testing performed on the biopsied tissue her recurrence score was 28 giving her 18% risk of distant recurrence with antiestrogen therapy. She was in the intermediate risk category. Patient was enrolled on neoadjuvant antiestrogen treatment clinical trial.  #3 she is currently receiving letrozole 2.5 mg starting in November 2013. She had ultrasound of the right breast performed that showed reduction in the size of the lymph nodes.  #4 patient also has prior history of left lumpectomy and radiation for DCIS treated in 2003. This tumor was apparently ER and PR negative so she did not receive adjuvant tamoxifen.  #5 patient is now status post right modified radical mastectomy with axillary lymph node dissection. The final pathology revealed 4.3 cm invasive ductal carcinoma 5 lymph nodes were positive for metastatic disease. Tumor ER +100% PR negative Ki-67 66%. Pathologic staging T2 N2. Patient's case was discussed at the multidisciplinary breast conference. Adjuvant  chemotherapy was recommended. I have spoken to the patient and I recommended either doing Taxotere Cytoxan every 3 weeks. However due Tuesday road usage patient may not be a good candidate for this. We therefore discussed the possibility of doing CMF every 21 days for a total of 6 cycles. Once she completes the chemotherapy then she will proceed to postmastectomy radiation therapy.  #6 CMF every 21 days adjuvantly to begin 05/23/2013  CURRENT THERAPY: Status post cycle 1 of CMF  INTERVAL HISTORY: Mary Young 72 y.o. female returns for followup visit today. Clinically patient seems to be doing well she is without any complaints. She denies any nausea vomiting fevers chills night sweats headaches shortness of breath chest pains palpitations no myalgias and arthralgias no peripheral paresthesias. Remainder of the 10 point review of systems is negative.  MEDICAL HISTORY: Past Medical History  Diagnosis Date  . Breast cancer     left lumpectomy  . Hypertension   . Glaucoma   . Central retinal vein occlusion   . Joint pain   . Dyslipidemia   . H/O echocardiogram 09/27/2009    Note to have some aortic valve sclerosis, mild mitral valve prolapse, mild mitral regurgitation, and mild asymptomatic LVH with an EF>55%  . History of stress test 09/27/2009    Normal Myocardial perfusion study. No significant ischemia demonstrated, this low risk scan. compared to the previous study.,there is no significant change, Normal myocardial Perfusion imaging. EF>83%  . Seasonal allergies     ALLERGIES:  is allergic to vicodin; percocet; precipitated sulfur; and timolol.  MEDICATIONS:  Current Outpatient Prescriptions  Medication Sig Dispense Refill  . aspirin EC 81 MG tablet Take 81 mg by mouth  daily.      . B Complex-C (SUPER B COMPLEX PO) Take 1 tablet by mouth daily.       . Calcium-Vitamin D-Vitamin K 500-100-40 MG-UNT-MCG CHEW Chew 1 tablet by mouth daily.       . dorzolamide (TRUSOPT) 2 %  ophthalmic solution Place 1 drop into both eyes 2 (two) times daily.       Marland Kitchen lidocaine-prilocaine (EMLA) cream Apply topically as needed.  30 g  0  . losartan (COZAAR) 50 MG tablet Take 50 mg by mouth daily.      . Multiple Vitamin (MULTIVITAMIN WITH MINERALS) TABS Take 1 tablet by mouth daily.      . timolol (TIMOPTIC) 0.5 % ophthalmic solution Place 1 drop into both eyes 2 (two) times daily. Non preservative      . TRAVATAN Z 0.004 % SOLN ophthalmic solution Place 1 drop into both eyes at bedtime.       . vitamin E 400 UNIT capsule Take 400 Units by mouth daily.      Marland Kitchen HYDROcodone-acetaminophen (NORCO/VICODIN) 5-325 MG per tablet Take 1-2 tablets by mouth every 4 (four) hours as needed for pain.  30 tablet  1  . LORazepam (ATIVAN) 0.5 MG tablet Take 1 tablet (0.5 mg total) by mouth every 6 (six) hours as needed (Nausea or vomiting).  30 tablet  0  . ondansetron (ZOFRAN) 8 MG tablet Take 1 tablet (8 mg total) by mouth as directed. Take two times a day starting the day after chemo for 2 days. Then take two times a day as needed for nausea or vomiting.  30 tablet  1  . prochlorperazine (COMPAZINE) 10 MG tablet Take 1 tablet (10 mg total) by mouth every 6 (six) hours as needed (Nausea or vomiting).  30 tablet  1  . prochlorperazine (COMPAZINE) 25 MG suppository Place 1 suppository (25 mg total) rectally every 12 (twelve) hours as needed for nausea.  12 suppository  3  . promethazine (PHENERGAN) 25 MG tablet Take 1 tablet (25 mg total) by mouth every 4 (four) hours as needed for nausea.  10 tablet  0   No current facility-administered medications for this visit.    SURGICAL HISTORY:  Past Surgical History  Procedure Laterality Date  . Abdominal hysterectomy  1991  . Breast lumpectomy  2003  . Arthroscopy knee w/ drilling  2005/2008    Left knee/Right knee  . Tubal ligation  1983  . Mastectomy modified radical Right 04/15/2013    Procedure: MASTECTOMY MODIFIED RADICAL;  Surgeon: Ernestene Mention, MD;  Location: [facility] OR;  Service: General;  Laterality: Right;  . Portacath placement Left 05/13/2013    Procedure: INSERTION PORT-A-CATH;  Surgeon: Ernestene Mention, MD;  Location: [facility] OR;  Service: General;  Laterality: Left;    REVIEW OF SYSTEMS:  Pertinent items are noted in HPI.   HEALTH MAINTENANCE:   PHYSICAL EXAMINATION: Blood pressure 135/70, pulse 69, temperature 97.5 F (36.4 C), temperature source Oral, resp. rate 18, height 5\' 3"  (1.6 m), weight 130 lb 1.6 oz (59.013 kg), SpO2 98.00%. Body mass index is 23.05 kg/(m^2). ECOG PERFORMANCE STATUS: 0 - Asymptomatic   General appearance: alert, cooperative and appears stated age Lymph nodes: Cervical, supraclavicular, and axillary nodes normal. Resp: clear to auscultation bilaterally Back: symmetric, no curvature. ROM normal. No CVA tenderness. Cardio: regular rate and rhythm GI: soft, non-tender; bowel sounds normal; no masses,  no organomegaly Extremities: extremities normal, atraumatic, no cyanosis or edema Neurologic: Grossly normal  Left breast: Well-healed surgical scar in the inner lower quadrant. Right breast reveals a palpable mass at the 10:00 position measuring about 4-5 cm. No nipple discharge no other masses  LABORATORY DATA: Lab Results  Component Value Date   WBC 3.3* 05/31/2013   HGB 12.0 05/31/2013   HCT 34.5* 05/31/2013   MCV 92.7 05/31/2013   PLT 195 05/31/2013      Chemistry      Component Value Date/Time   NA 145 05/31/2013 1006   NA 143 05/11/2013 0940   K 4.0 05/31/2013 1006   K 3.8 05/11/2013 0940   CL 106 05/11/2013 0940   CL 107 03/22/2013 0811   CO2 29 05/31/2013 1006   CO2 27 05/11/2013 0940   BUN 14.3 05/31/2013 1006   BUN 14 05/11/2013 0940   CREATININE 0.6 05/31/2013 1006   CREATININE 0.60 05/11/2013 0940      Component Value Date/Time   CALCIUM 9.3 05/31/2013 1006   CALCIUM 9.2 05/11/2013 0940   ALKPHOS 80 05/31/2013 1006   ALKPHOS 69 04/08/2013 0942   AST 16 05/31/2013 1006   AST 22  04/08/2013 0942   ALT 16 05/31/2013 1006   ALT 20 04/08/2013 0942   BILITOT 0.21 05/31/2013 1006   BILITOT 0.6 04/08/2013 0942     ADDITIONAL INFORMATION: PROGNOSTIC INDICATORS - ACIS Results: IMMUNOHISTOCHEMICAL AND MORPHOMETRIC ANALYSIS BY THE AUTOMATED CELLULAR IMAGING SYSTEM (ACIS) Estrogen Receptor: 100%, POSITIVE, STRONG STAINING INTENSITY Progesterone Receptor: 0%, NEGATIVE COMMENT: The negative hormone receptor study in this case has no internal positive control. REFERENCE RANGE ESTROGEN RECEPTOR NEGATIVE <1% POSITIVE =>1% PROGESTERONE RECEPTOR NEGATIVE <1% POSITIVE =>1% All controls stained appropriately Jimmy Picket MD Pathologist, Electronic Signature ( Signed 04/22/2013) CHROMOGENIC IN-SITU HYBRIDIZATION Results: HER-2/NEU BY CISH - NO AMPLIFICATION OF HER-2 DETECTED. RESULT RATIO OF HER2: CEP 17 SIGNALS 0.91 AVERAGE HER2 COPY NUMBER PER CELL 2.15 1 of 4 FINAL for ALLEAN, MONTFORT ([id number]) ADDITIONAL INFORMATION:(continued) REFERENCE RANGE NEGATIVE HER2/Chr17 Ratio <2.0 and Average HER2 copy number <4.0 EQUIVOCAL HER2/Chr17 Ratio <2.0 and Average HER2 copy number 4.0 and <6.0 POSITIVE HER2/Chr17 Ratio >=2.0 and/or Average HER2 copy number >=6.0 Jimmy Picket MD Pathologist, Electronic Signature ( Signed 04/22/2013) FINAL DIAGNOSIS Diagnosis Breast, modified radical mastectomy , Right and axillary contents - RESIDUAL INVASIVE DUCAL CARCINOMA, NOTTINGHAM COMBINED HISTOLOGIC GRADE II, 4.3 CM - INTERMEDIATE GRADE DUCTAL CARCINOMA IN SITU. - ANGIOLYMPHATIC INVASION PRESENT. - EXTENSIVE STROMAL FIBROSIS, CONSISTENT WITH POST-TREATMENT EFFECT. - FIVE OF TWENTY-THREE LYMPH NODES, POSITIVE FOR METASTATIC CARCINOMA (5/23). - RESECTION MARGINS, NEGATIVE FOR ATYPIA OR MALIGNANCY. - PLEASE SEE ONCOLOGY TEMPLATE FOR DETAILS. Microscopic Comment BREAST, INVASIVE TUMOR, WITH LYMPH NODE SAMPLING Specimen, including laterality: Right breast and axillary lymph  nodes Procedure: Modified radical mastectomy with lymph node dissection Grade: II Tubule formation: 2 Nuclear pleomorphism: 2 Mitotic: 1 Tumor size (gross measurement): 4.3 cm (clusters of tumor cells contiguously spread throughout the sections; dense stromal fibrosis in the background, consistent with post-treatment effect) Margins: Negative Invasive, distance to closest margin: Deep margin: more than 1 cm In-situ, distance to closest margin: Deep margin: more then 1 cm Lymphovascular invasion: Present Ductal carcinoma in situ: Present Grade: Intermediate grade Extensive intraductal component: No Lobular neoplasia: N/A Tumor focality: Unifocal Treatment effect: Present, only in the breast tissue Extent of tumor: Skin: No Nipple: No Skeletal muscle: N/A Lymph nodes: # examined: 23 Lymph nodes with metastasis: 5 2 of 4 FINAL for KELIYAH, SPILLMAN A ([id number]) Microscopic Comment(continued) Isolated tumor cells (< 0.2 mm): 0 Micrometastasis: (> 0.2  mm and < 2.0 mm): 0 Macrometastasis: (> 2.0 mm): 5 Extracapsular extension: No Breast prognostic profile: Please correlate with previous case [phone] Estrogen receptor: 100%, positive, strong staining intensity. Progesterone receptor: 96%, positive, strong staining intensity. Her 2 neu: No amplification, the ratio of Her 2:CEP 17 signals was 0.86 Ki-67: 66%; The breast prognostic profile will be repeated and an addendum report will follow. Non-neoplastic breast: Dense stromal fibrosis, consistent with post-treatment effect. TNM: ypT2, ypN2a Comments: A breast prognostic profile will be repeated and an addendum report will follow. Sections show invasive ductal carcinoma with micropapillary lobular features. Immunohistochemical stain for E-Cadherin was performed an the tumor cells are strongly positive for E-Cadherin. The tumor is contiguously spreading throughout the sections with associated fibrotic stroma in the background;  the morphologic features are mostly consistent with post-treatment effect. (HCL:kh 04-18-13) Abigail Miyamoto MD Pathologist, Electronic Signature (Case signed 04/20/2013) Specimen Gross and Clinical Information   RADIOGRAPHIC STUDIES:  No results found.  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 The tumor was ER positive PR negative HER-2/neu negative with an elevated Ki-67 of 66%. She is on neoadjuvant antiestrogen therapy consisting of letrozole 2.5 mg. She had an Oncotype DX testing performed on her biopsy tissue that put her in the intermediate risk category.   #2 patient is now status post right modified radical mastectomy with axillary lymph node dissection. She does have significant residual disease measuring 4.3 cm which is ER positive PR negative HER-2/neu negative. 5 of 23 lymph nodes were positive for metastatic disease. We discussed the pathology today  #3 patient with prior history of left breast cancer which was DCIS she underwent a lumpectomy followed by radiation.  #4 I have recommended chemotherapy consisting of CMF to be given every 3 weeks. We cannot use other agents do to her comorbidities and the use of steroids. Corticosteroid usage is contraindicated in patients do to her primary eye issues.  PLAN:  #1 you are now status post cycle 1 of CMF. Overall he tolerated it well.  #2 we checked your blood counts today they look terrific.  #3 I will plan on seeing you back on 06/14/2013 for cycle #2 of chemotherapy.  All questions were answered. The patient knows to call the clinic with any problems, questions or concerns. We can certainly see the patient much sooner if necessary.  I spent 25 minutes counseling the patient face to face. The total time spent in the appointment was 30 minutes.    Drue Second, MD Medical/Oncology [facility] [phone]  (beeper) [phone] (Office)

## 2013-05-31 NOTE — Patient Instructions (Addendum)
#  1 you are now status post cycle 1 of CMF. Overall he tolerated it well.  #2 we checked your blood counts today they look terrific.  #3 I will plan on seeing you back on 06/14/2013 for cycle #2 of chemotherapy.

## 2013-06-01 ENCOUNTER — Ambulatory Visit: Payer: Medicare Other

## 2013-06-02 ENCOUNTER — Other Ambulatory Visit: Payer: Self-pay | Admitting: Emergency Medicine

## 2013-06-02 ENCOUNTER — Telehealth: Payer: Self-pay | Admitting: Oncology

## 2013-06-02 NOTE — Telephone Encounter (Signed)
Per 7/15 pof f/u as scheduled

## 2013-06-07 ENCOUNTER — Ambulatory Visit (INDEPENDENT_AMBULATORY_CARE_PROVIDER_SITE_OTHER): Payer: Medicare Other | Admitting: General Surgery

## 2013-06-07 ENCOUNTER — Ambulatory Visit: Payer: Medicare Other

## 2013-06-07 ENCOUNTER — Encounter (INDEPENDENT_AMBULATORY_CARE_PROVIDER_SITE_OTHER): Payer: Self-pay | Admitting: General Surgery

## 2013-06-07 VITALS — BP 138/72 | HR 68 | Temp 98.4°F | Resp 15 | Ht 63.5 in | Wt 131.4 lb

## 2013-06-07 DIAGNOSIS — C50411 Malignant neoplasm of upper-outer quadrant of right female breast: Secondary | ICD-10-CM

## 2013-06-07 DIAGNOSIS — C50419 Malignant neoplasm of upper-outer quadrant of unspecified female breast: Secondary | ICD-10-CM

## 2013-06-07 NOTE — Progress Notes (Signed)
Patient ID: Mary Young, female   DOB: July 07, 1941, 72 y.o.   MRN: [id number] History:   This patient initially presented 7 months ago with a large tumor in her right breast, upper outer quadrant. Imaging studies showed a 5.2 cm tumor and a 2.0 cm right axillary lymph node. 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  She has a past history of left partial mastectomy and sentinel node biopsy and radiation therapy in [facility] [place]. His tumor might have been microinvasive, node negative disease. She has no evidence of recurrence there.  She completed a course of neoadjuvant letrozole. . MRI performed on 03/22/2013 shows minimal response of her right breast cancer, still 4.5 cm. .  On 04/15/2013 she underwent right modified radical mastectomy. She is recovering uneventfully Except she feels a little bit of fluid under the skin flap.  I placed a Port-A-Cath on 05/13/2013. She has started chemotherapy and has done well with her first treatment. Last office visit was July 9. I aspirated a small seroma from the mastectomy wound and she was otherwise doing well. She was referred to physical therapy.  Exam: patient is alert, friendly, no distress. Right mastectomy wound is healing uneventfully. There is no infection. There is no evidence of fluid or [place]. Range of motion right shoulder is excellent. No arm swelling  Assessment:   Locally advanced, invasive mammary carcinoma right breast, central and upper outer quadrant, receptor positive, HER-2-negative, pretreatment clinical stage T3,N1; Final stage ypT2, ypN2.  Recovering uneventfully following right modified radical mastectomy  Small seroma right mastectomy wound, No recurrence  Status post Port-A-Cath insertion, no apparent complications  Plan: Continue with adjuvant chemotherapy Return to see me in 4 months Remove  Port-A-Cath once chemotherapy completed     Kena Limon M. Derrell Lolling, M.D., [facility] General and Minimally invasive Surgery Breast and Colorectal Surgery Office:   [phone] Pager:   [phone]

## 2013-06-07 NOTE — Patient Instructions (Signed)
Your mastectomy wound is healing normally. There is no sign of infection. There is no evidence of recurrent fluid collection.  Return to see Dr. Derrell Lolling in 4 months, sooner if there are any problems.

## 2013-06-09 ENCOUNTER — Ambulatory Visit: Payer: Medicare Other | Admitting: Physical Therapy

## 2013-06-13 ENCOUNTER — Ambulatory Visit: Payer: Medicare Other | Admitting: Physical Therapy

## 2013-06-14 ENCOUNTER — Other Ambulatory Visit (HOSPITAL_BASED_OUTPATIENT_CLINIC_OR_DEPARTMENT_OTHER): Payer: Medicare Other | Admitting: Lab

## 2013-06-14 ENCOUNTER — Encounter: Payer: Self-pay | Admitting: Adult Health

## 2013-06-14 ENCOUNTER — Telehealth: Payer: Self-pay | Admitting: *Deleted

## 2013-06-14 ENCOUNTER — Ambulatory Visit (HOSPITAL_BASED_OUTPATIENT_CLINIC_OR_DEPARTMENT_OTHER): Payer: Medicare Other | Admitting: Adult Health

## 2013-06-14 ENCOUNTER — Ambulatory Visit (HOSPITAL_BASED_OUTPATIENT_CLINIC_OR_DEPARTMENT_OTHER): Payer: Medicare Other

## 2013-06-14 VITALS — BP 161/82 | HR 62 | Temp 97.7°F | Resp 20 | Ht 63.5 in | Wt 130.6 lb

## 2013-06-14 DIAGNOSIS — C773 Secondary and unspecified malignant neoplasm of axilla and upper limb lymph nodes: Secondary | ICD-10-CM

## 2013-06-14 DIAGNOSIS — C50919 Malignant neoplasm of unspecified site of unspecified female breast: Secondary | ICD-10-CM

## 2013-06-14 DIAGNOSIS — C50419 Malignant neoplasm of upper-outer quadrant of unspecified female breast: Secondary | ICD-10-CM

## 2013-06-14 DIAGNOSIS — C50411 Malignant neoplasm of upper-outer quadrant of right female breast: Secondary | ICD-10-CM

## 2013-06-14 DIAGNOSIS — C50911 Malignant neoplasm of unspecified site of right female breast: Secondary | ICD-10-CM

## 2013-06-14 DIAGNOSIS — Z5111 Encounter for antineoplastic chemotherapy: Secondary | ICD-10-CM

## 2013-06-14 LAB — CBC WITH DIFFERENTIAL/PLATELET
BASO%: 0.9 % (ref 0.0–2.0)
Basophils Absolute: 0 10*3/uL (ref 0.0–0.1)
EOS%: 5.4 % (ref 0.0–7.0)
Eosinophils Absolute: 0.2 10*3/uL (ref 0.0–0.5)
HCT: 37.6 % (ref 34.8–46.6)
HGB: 12.8 g/dL (ref 11.6–15.9)
LYMPH%: 48.9 % (ref 14.0–49.7)
MCH: 31.6 pg (ref 25.1–34.0)
MCHC: 34 g/dL (ref 31.5–36.0)
MCV: 92.8 fL (ref 79.5–101.0)
MONO#: 0.4 10*3/uL (ref 0.1–0.9)
MONO%: 11.7 % (ref 0.0–14.0)
NEUT#: 1.1 10*3/uL — ABNORMAL LOW (ref 1.5–6.5)
NEUT%: 33.1 % — ABNORMAL LOW (ref 38.4–76.8)
Platelets: 231 10*3/uL (ref 145–400)
RBC: 4.05 10*6/uL (ref 3.70–5.45)
RDW: 12.4 % (ref 11.2–14.5)
WBC: 3.2 10*3/uL — ABNORMAL LOW (ref 3.9–10.3)
lymph#: 1.6 10*3/uL (ref 0.9–3.3)
nRBC: 0 % (ref 0–0)

## 2013-06-14 LAB — COMPREHENSIVE METABOLIC PANEL (CC13)
ALT: 22 U/L (ref 0–55)
AST: 22 U/L (ref 5–34)
Albumin: 3.7 g/dL (ref 3.5–5.0)
Alkaline Phosphatase: 85 U/L (ref 40–150)
BUN: 13.5 mg/dL (ref 7.0–26.0)
CO2: 28 mEq/L (ref 22–29)
Calcium: 9.4 mg/dL (ref 8.4–10.4)
Chloride: 106 mEq/L (ref 98–109)
Creatinine: 0.7 mg/dL (ref 0.6–1.1)
Glucose: 96 mg/dl (ref 70–140)
Potassium: 4.1 mEq/L (ref 3.5–5.1)
Sodium: 145 mEq/L (ref 136–145)
Total Bilirubin: 0.49 mg/dL (ref 0.20–1.20)
Total Protein: 6.6 g/dL (ref 6.4–8.3)

## 2013-06-14 MED ORDER — SODIUM CHLORIDE 0.9 % IV SOLN: Freq: Once | INTRAVENOUS | Status: AC

## 2013-06-14 MED ORDER — SODIUM CHLORIDE 0.9 % IV SOLN
Freq: Once | INTRAVENOUS | Status: AC
  Administered 2013-06-14: 10:00:00 via INTRAVENOUS

## 2013-06-14 MED ORDER — HEPARIN SOD (PORK) LOCK FLUSH 100 UNIT/ML IV SOLN
500.0000 [IU] | Freq: Once | INTRAVENOUS | Status: AC | PRN
  Administered 2013-06-14: 500 [IU]
  Filled 2013-06-14: qty 5

## 2013-06-14 MED ORDER — SODIUM CHLORIDE 0.9 % IJ SOLN
10.0000 mL | INTRAMUSCULAR | Status: DC | PRN
  Administered 2013-06-14: 10 mL
  Filled 2013-06-14: qty 10

## 2013-06-14 MED ORDER — METHOTREXATE SODIUM CHEMO INJECTION 25 MG/ML
40.0000 mg/m2 | Freq: Once | INTRAMUSCULAR | Status: AC
  Administered 2013-06-14: 65 mg via INTRAVENOUS
  Filled 2013-06-14: qty 2.6

## 2013-06-14 MED ORDER — SODIUM CHLORIDE 0.9 % IV SOLN
600.0000 mg/m2 | Freq: Once | INTRAVENOUS | Status: AC
  Administered 2013-06-14: 1000 mg via INTRAVENOUS
  Filled 2013-06-14: qty 50

## 2013-06-14 MED ORDER — FLUOROURACIL CHEMO INJECTION 2.5 GM/50ML
600.0000 mg/m2 | Freq: Once | INTRAVENOUS | Status: AC
  Administered 2013-06-14: 1000 mg via INTRAVENOUS
  Filled 2013-06-14: qty 20

## 2013-06-14 MED ORDER — ONDANSETRON 8 MG/50ML IVPB (CHCC)
8.0000 mg | Freq: Once | INTRAVENOUS | Status: AC
  Administered 2013-06-14: 8 mg via INTRAVENOUS

## 2013-06-14 NOTE — Telephone Encounter (Signed)
appts made and printed. Pt is aware that her tx will be added. i emailed MW to add the tx's...td 

## 2013-06-14 NOTE — Patient Instructions (Addendum)

## 2013-06-14 NOTE — Patient Instructions (Addendum)
[facility] Discharge Instructions for Patients Receiving Chemotherapy  Today you received the following chemotherapy agents Cytoxan, Methotrexate, 5Fu To help prevent nausea and vomiting after your treatment, we encourage you to take your nausea medication as prescribed.   If you develop nausea and vomiting that is not controlled by your nausea medication, call the clinic.   BELOW ARE SYMPTOMS THAT SHOULD BE REPORTED IMMEDIATELY:  *FEVER GREATER THAN 100.5 F  *CHILLS WITH OR WITHOUT FEVER  NAUSEA AND VOMITING THAT IS NOT CONTROLLED WITH YOUR NAUSEA MEDICATION  *UNUSUAL SHORTNESS OF BREATH  *UNUSUAL BRUISING OR BLEEDING  TENDERNESS IN MOUTH AND THROAT WITH OR WITHOUT PRESENCE OF ULCERS  *URINARY PROBLEMS  *BOWEL PROBLEMS  UNUSUAL RASH Items with * indicate a potential emergency and should be followed up as soon as possible.  Feel free to call the clinic you have any questions or concerns. The clinic phone number is [phone].

## 2013-06-14 NOTE — Telephone Encounter (Signed)
Per staff message and POF I have scheduled appts.  JMW  

## 2013-06-14 NOTE — Addendum Note (Signed)
Addended by: Augustin Schooling C on: 06/14/2013 12:04 PM   Modules accepted: Orders

## 2013-06-14 NOTE — Progress Notes (Signed)
OFFICE PROGRESS NOTE  CC Dr. Maurice Small Dr. Claud Kelp Dr. 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 Patient had a lymph node enlarged there was biopsied and was consistent with metastatic ductal carcinoma. Diagnosed October 2013.  PRIOR THERAPY:  #1 patient underwent a screening mammogram in September 2013 that showed a right breast mass with associated calcifications. On MRI the mass measured 4.7 x 3.6 x 5.2 cm lymph node measuring 1.5 x 1.0 x 2.0 cm. Patient had a needle core biopsy performed that showed invasive mammary carcinoma ER positive PR positive HER-2/neu negative with Ki-67 elevated at 66%. The lymph node was biopsied and was consistent with metastatic ductal carcinoma.  #2 patient was seen in the multidisciplinary breast clinic for discussion of treatment options. She had a Oncotype DX testing performed on the biopsied tissue her recurrence score was 28 giving her 18% risk of distant recurrence with antiestrogen therapy. She was in the intermediate risk category. Patient was enrolled on neoadjuvant antiestrogen treatment clinical trial.  #3 she is currently receiving letrozole 2.5 mg starting in November 2013. She had ultrasound of the right breast performed that showed reduction in the size of the lymph nodes.  #4 patient also has prior history of left lumpectomy and radiation for DCIS treated in 2003. This tumor was apparently ER and PR negative so she did not receive adjuvant tamoxifen.  #5 patient is now status post right modified radical mastectomy with axillary lymph node dissection. The final pathology revealed 4.3 cm invasive ductal carcinoma 5 lymph nodes were positive for metastatic disease. Tumor ER +100% PR negative Ki-67 66%. Pathologic staging T2 N2. Patient's case was discussed at the multidisciplinary breast conference. Adjuvant  chemotherapy was recommended. I have spoken to the patient and I recommended either doing Taxotere Cytoxan every 3 weeks. However due Tuesday road usage patient may not be a good candidate for this. We therefore discussed the possibility of doing CMF every 21 days for a total of 6 cycles. Once she completes the chemotherapy then she will proceed to postmastectomy radiation therapy.  #6 CMF every 21 days adjuvantly to begin 05/23/2013  CURRENT THERAPY: Cycle 2 day 1 CMF  INTERVAL HISTORY: Mary Young 72 y.o. female returns for followup visit today.  She is doing well today, she tolerated the first cycle moderately well.  She had nausea managed with small snacks every 2 hours.  Today she denies fevers, chills, nausea,vomiting, constipation, diarrhea, numbness or any further concerns.  A 10 point ROS is negative.    MEDICAL HISTORY: Past Medical History  Diagnosis Date  . Breast cancer     left lumpectomy  . Hypertension   . Glaucoma   . Central retinal vein occlusion   . Joint pain   . Dyslipidemia   . H/O echocardiogram 09/27/2009    Note to have some aortic valve sclerosis, mild mitral valve prolapse, mild mitral regurgitation, and mild asymptomatic LVH with an EF>55%  . History of stress test 09/27/2009    Normal Myocardial perfusion study. No significant ischemia demonstrated, this low risk scan. compared to the previous study.,there is no significant change, Normal myocardial Perfusion imaging. EF>83%  . Seasonal allergies     ALLERGIES:  is allergic to vicodin; percocet; precipitated sulfur; and timolol.  MEDICATIONS:  Current Outpatient Prescriptions  Medication Sig Dispense Refill  . aspirin EC 81 MG tablet Take 81 mg by  mouth daily.      . B Complex-C (SUPER B COMPLEX PO) Take 1 tablet by mouth daily.       . Calcium-Vitamin D-Vitamin K 500-100-40 MG-UNT-MCG CHEW Chew 1 tablet by mouth daily.       . dorzolamide (TRUSOPT) 2 % ophthalmic solution Place 1 drop into both  eyes 2 (two) times daily.       Marland Kitchen HYDROcodone-acetaminophen (NORCO/VICODIN) 5-325 MG per tablet Take 1-2 tablets by mouth every 4 (four) hours as needed for pain.  30 tablet  1  . lidocaine-prilocaine (EMLA) cream Apply topically as needed.  30 g  0  . LORazepam (ATIVAN) 0.5 MG tablet Take 1 tablet (0.5 mg total) by mouth every 6 (six) hours as needed (Nausea or vomiting).  30 tablet  0  . losartan (COZAAR) 50 MG tablet Take 50 mg by mouth daily.      . Multiple Vitamin (MULTIVITAMIN WITH MINERALS) TABS Take 1 tablet by mouth daily.      . prochlorperazine (COMPAZINE) 10 MG tablet Take 1 tablet (10 mg total) by mouth every 6 (six) hours as needed (Nausea or vomiting).  30 tablet  1  . prochlorperazine (COMPAZINE) 25 MG suppository Place 1 suppository (25 mg total) rectally every 12 (twelve) hours as needed for nausea.  12 suppository  3  . promethazine (PHENERGAN) 25 MG tablet Take 1 tablet (25 mg total) by mouth every 4 (four) hours as needed for nausea.  10 tablet  0  . timolol (TIMOPTIC) 0.5 % ophthalmic solution Place 1 drop into both eyes 2 (two) times daily. Non preservative      . TRAVATAN Z 0.004 % SOLN ophthalmic solution Place 1 drop into both eyes at bedtime.       . vitamin E 400 UNIT capsule Take 400 Units by mouth daily.       No current facility-administered medications for this visit.    SURGICAL HISTORY:  Past Surgical History  Procedure Laterality Date  . Abdominal hysterectomy  1991  . Breast lumpectomy  2003  . Arthroscopy knee w/ drilling  2005/2008    Left knee/Right knee  . Tubal ligation  1983  . Mastectomy modified radical Right 04/15/2013    Procedure: MASTECTOMY MODIFIED RADICAL;  Surgeon: Ernestene Mention, MD;  Location: [facility] OR;  Service: General;  Laterality: Right;  . Portacath placement Left 05/13/2013    Procedure: INSERTION PORT-A-CATH;  Surgeon: Ernestene Mention, MD;  Location: [facility] OR;  Service: General;  Laterality: Left;    REVIEW OF SYSTEMS:  Pertinent  items are noted in HPI.   HEALTH MAINTENANCE:   PHYSICAL EXAMINATION: Blood pressure 161/82, pulse 62, temperature 97.7 F (36.5 C), temperature source Oral, resp. rate 20, height 5' 3.5" (1.613 m), weight 130 lb 9.6 oz (59.24 kg). Body mass index is 22.77 kg/(m^2). General: Patient is a well appearing female in no acute distress HEENT: PERRLA, sclerae anicteric no conjunctival pallor, MMM Neck: supple, no palpable adenopathy Lungs: clear to auscultation bilaterally, no wheezes, rhonchi, or rales Cardiovascular: regular rate rhythm, S1, S2, no murmurs, rubs or gallops Abdomen: Soft, non-tender, non-distended, normoactive bowel sounds, no HSM Extremities: warm and well perfused, no clubbing, cyanosis, or edema Skin: No rashes or lesions Neuro: Non-focal Left breast: Well-healed surgical scar in the inner lower quadrant.  No nodules, or masses Right mastectomy well healed, no nodularity.    ECOG PERFORMANCE STATUS: 0 - Asymptomatic      LABORATORY DATA: Lab Results  Component Value  Date   WBC 3.3* 05/31/2013   HGB 12.0 05/31/2013   HCT 34.5* 05/31/2013   MCV 92.7 05/31/2013   PLT 195 05/31/2013      Chemistry      Component Value Date/Time   NA 145 05/31/2013 1006   NA 143 05/11/2013 0940   K 4.0 05/31/2013 1006   K 3.8 05/11/2013 0940   CL 106 05/11/2013 0940   CL 107 03/22/2013 0811   CO2 29 05/31/2013 1006   CO2 27 05/11/2013 0940   BUN 14.3 05/31/2013 1006   BUN 14 05/11/2013 0940   CREATININE 0.6 05/31/2013 1006   CREATININE 0.60 05/11/2013 0940      Component Value Date/Time   CALCIUM 9.3 05/31/2013 1006   CALCIUM 9.2 05/11/2013 0940   ALKPHOS 80 05/31/2013 1006   ALKPHOS 69 04/08/2013 0942   AST 16 05/31/2013 1006   AST 22 04/08/2013 0942   ALT 16 05/31/2013 1006   ALT 20 04/08/2013 0942   BILITOT 0.21 05/31/2013 1006   BILITOT 0.6 04/08/2013 0942     ADDITIONAL INFORMATION: PROGNOSTIC INDICATORS - ACIS Results: IMMUNOHISTOCHEMICAL AND MORPHOMETRIC ANALYSIS BY THE  AUTOMATED CELLULAR IMAGING SYSTEM (ACIS) Estrogen Receptor: 100%, POSITIVE, STRONG STAINING INTENSITY Progesterone Receptor: 0%, NEGATIVE COMMENT: The negative hormone receptor study in this case has no internal positive control. REFERENCE RANGE ESTROGEN RECEPTOR NEGATIVE <1% POSITIVE =>1% PROGESTERONE RECEPTOR NEGATIVE <1% POSITIVE =>1% All controls stained appropriately Jimmy Picket MD Pathologist, Electronic Signature ( Signed 04/22/2013) CHROMOGENIC IN-SITU HYBRIDIZATION Results: HER-2/NEU BY CISH - NO AMPLIFICATION OF HER-2 DETECTED. RESULT RATIO OF HER2: CEP 17 SIGNALS 0.91 AVERAGE HER2 COPY NUMBER PER CELL 2.15 1 of 4 FINAL for Mary Young, Mary Young ([id number]) ADDITIONAL INFORMATION:(continued) REFERENCE RANGE NEGATIVE HER2/Chr17 Ratio <2.0 and Average HER2 copy number <4.0 EQUIVOCAL HER2/Chr17 Ratio <2.0 and Average HER2 copy number 4.0 and <6.0 POSITIVE HER2/Chr17 Ratio >=2.0 and/or Average HER2 copy number >=6.0 Jimmy Picket MD Pathologist, Electronic Signature ( Signed 04/22/2013) FINAL DIAGNOSIS Diagnosis Breast, modified radical mastectomy , Right and axillary contents - RESIDUAL INVASIVE DUCAL CARCINOMA, NOTTINGHAM COMBINED HISTOLOGIC GRADE II, 4.3 CM - INTERMEDIATE GRADE DUCTAL CARCINOMA IN SITU. - ANGIOLYMPHATIC INVASION PRESENT. - EXTENSIVE STROMAL FIBROSIS, CONSISTENT WITH POST-TREATMENT EFFECT. - FIVE OF TWENTY-THREE LYMPH NODES, POSITIVE FOR METASTATIC CARCINOMA (5/23). - RESECTION MARGINS, NEGATIVE FOR ATYPIA OR MALIGNANCY. - PLEASE SEE ONCOLOGY TEMPLATE FOR DETAILS. Microscopic Comment BREAST, INVASIVE TUMOR, WITH LYMPH NODE SAMPLING Specimen, including laterality: Right breast and axillary lymph nodes Procedure: Modified radical mastectomy with lymph node dissection Grade: II Tubule formation: 2 Nuclear pleomorphism: 2 Mitotic: 1 Tumor size (gross measurement): 4.3 cm (clusters of tumor cells contiguously spread throughout the sections; dense  stromal fibrosis in the background, consistent with post-treatment effect) Margins: Negative Invasive, distance to closest margin: Deep margin: more than 1 cm In-situ, distance to closest margin: Deep margin: more then 1 cm Lymphovascular invasion: Present Ductal carcinoma in situ: Present Grade: Intermediate grade Extensive intraductal component: No Lobular neoplasia: N/A Tumor focality: Unifocal Treatment effect: Present, only in the breast tissue Extent of tumor: Skin: No Nipple: No Skeletal muscle: N/A Lymph nodes: # examined: 23 Lymph nodes with metastasis: 5 2 of 4 FINAL for Mary Young, Mary Young ([id number]) Microscopic Comment(continued) Isolated tumor cells (< 0.2 mm): 0 Micrometastasis: (> 0.2 mm and < 2.0 mm): 0 Macrometastasis: (> 2.0 mm): 5 Extracapsular extension: No Breast prognostic profile: Please correlate with previous case [phone] Estrogen receptor: 100%, positive, strong staining intensity. Progesterone receptor: 96%, positive, strong staining intensity. Her  2 neu: No amplification, the ratio of Her 2:CEP 17 signals was 0.86 Ki-67: 66%; The breast prognostic profile will be repeated and an addendum report will follow. Non-neoplastic breast: Dense stromal fibrosis, consistent with post-treatment effect. TNM: ypT2, ypN2a Comments: A breast prognostic profile will be repeated and an addendum report will follow. Sections show invasive ductal carcinoma with micropapillary lobular features. Immunohistochemical stain for E-Cadherin was performed an the tumor cells are strongly positive for E-Cadherin. The tumor is contiguously spreading throughout the sections with associated fibrotic stroma in the background; the morphologic features are mostly consistent with post-treatment effect. (HCL:kh 04-18-13) Abigail Miyamoto MD Pathologist, Electronic Signature (Case signed 04/20/2013) Specimen Gross and Clinical Information   RADIOGRAPHIC STUDIES:  No results  found.  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 The tumor was ER positive PR negative HER-2/neu negative with an elevated Ki-67 of 66%. She is on neoadjuvant antiestrogen therapy consisting of letrozole 2.5 mg. She had an Oncotype DX testing performed on her biopsy tissue that put her in the intermediate risk category.   #2 patient is now status post right modified radical mastectomy with axillary lymph node dissection. She does have significant residual disease measuring 4.3 cm which is ER positive PR negative HER-2/neu negative. 5 of 23 lymph nodes were positive for metastatic disease. We discussed the pathology today  #3 patient with prior history of left breast cancer which was DCIS she underwent a lumpectomy followed by radiation.  #4 I have recommended chemotherapy consisting of CMF to be given every 3 weeks. We cannot use other agents do to her comorbidities and the use of steroids. Corticosteroid usage is contraindicated in patients do to her primary eye issues.  PLAN:  #1 Doing well, patient will proceed with chemotherapy and return tomorrow for Neulasta due to her ANC being 1.1.  She verbalized understanding of this and was given information on Neulasta in her AVS.   #2 We will see the patient back in 1 week for labs and an evaluation of chemotoxicities.  All questions were answered. The patient knows to call the clinic with any problems, questions or concerns. We can certainly see the patient much sooner if necessary.  I spent 25 minutes counseling the patient face to face. The total time spent in the appointment was 30 minutes.    Cherie Ouch Lyn Hollingshead, NP Medical Oncology [facility] Phone: [phone]

## 2013-06-15 ENCOUNTER — Other Ambulatory Visit: Payer: Self-pay | Admitting: Certified Registered Nurse Anesthetist

## 2013-06-15 ENCOUNTER — Ambulatory Visit (HOSPITAL_BASED_OUTPATIENT_CLINIC_OR_DEPARTMENT_OTHER): Payer: Medicare Other

## 2013-06-15 VITALS — BP 148/77 | HR 69 | Temp 98.1°F

## 2013-06-15 DIAGNOSIS — C773 Secondary and unspecified malignant neoplasm of axilla and upper limb lymph nodes: Secondary | ICD-10-CM

## 2013-06-15 DIAGNOSIS — Z5189 Encounter for other specified aftercare: Secondary | ICD-10-CM

## 2013-06-15 DIAGNOSIS — C50419 Malignant neoplasm of upper-outer quadrant of unspecified female breast: Secondary | ICD-10-CM

## 2013-06-15 DIAGNOSIS — C50919 Malignant neoplasm of unspecified site of unspecified female breast: Secondary | ICD-10-CM

## 2013-06-15 MED ORDER — PEGFILGRASTIM INJECTION 6 MG/0.6ML
6.0000 mg | Freq: Once | SUBCUTANEOUS | Status: AC
  Administered 2013-06-15: 6 mg via SUBCUTANEOUS
  Filled 2013-06-15: qty 0.6

## 2013-06-15 NOTE — Patient Instructions (Addendum)

## 2013-06-16 ENCOUNTER — Encounter: Payer: Self-pay | Admitting: Gastroenterology

## 2013-06-20 ENCOUNTER — Ambulatory Visit: Payer: Medicare Other | Attending: General Surgery | Admitting: Physical Therapy

## 2013-06-20 DIAGNOSIS — C50919 Malignant neoplasm of unspecified site of unspecified female breast: Secondary | ICD-10-CM | POA: Insufficient documentation

## 2013-06-20 DIAGNOSIS — M24519 Contracture, unspecified shoulder: Secondary | ICD-10-CM | POA: Insufficient documentation

## 2013-06-20 DIAGNOSIS — Z79899 Other long term (current) drug therapy: Secondary | ICD-10-CM | POA: Insufficient documentation

## 2013-06-20 DIAGNOSIS — R209 Unspecified disturbances of skin sensation: Secondary | ICD-10-CM | POA: Insufficient documentation

## 2013-06-20 DIAGNOSIS — IMO0001 Reserved for inherently not codable concepts without codable children: Secondary | ICD-10-CM | POA: Insufficient documentation

## 2013-06-20 DIAGNOSIS — Z9010 Acquired absence of unspecified breast and nipple: Secondary | ICD-10-CM | POA: Insufficient documentation

## 2013-06-21 ENCOUNTER — Encounter: Payer: Self-pay | Admitting: Oncology

## 2013-06-21 ENCOUNTER — Ambulatory Visit: Payer: Medicare Other

## 2013-06-21 ENCOUNTER — Ambulatory Visit (HOSPITAL_BASED_OUTPATIENT_CLINIC_OR_DEPARTMENT_OTHER): Payer: Medicare Other | Admitting: Oncology

## 2013-06-21 ENCOUNTER — Other Ambulatory Visit (HOSPITAL_BASED_OUTPATIENT_CLINIC_OR_DEPARTMENT_OTHER): Payer: Medicare Other

## 2013-06-21 VITALS — BP 145/84 | HR 65 | Temp 98.3°F | Resp 20 | Ht 63.5 in | Wt 130.2 lb

## 2013-06-21 DIAGNOSIS — C50419 Malignant neoplasm of upper-outer quadrant of unspecified female breast: Secondary | ICD-10-CM

## 2013-06-21 DIAGNOSIS — C50919 Malignant neoplasm of unspecified site of unspecified female breast: Secondary | ICD-10-CM

## 2013-06-21 DIAGNOSIS — C773 Secondary and unspecified malignant neoplasm of axilla and upper limb lymph nodes: Secondary | ICD-10-CM

## 2013-06-21 DIAGNOSIS — Z853 Personal history of malignant neoplasm of breast: Secondary | ICD-10-CM

## 2013-06-21 DIAGNOSIS — Z170 Estrogen receptor positive status [ER+]: Secondary | ICD-10-CM

## 2013-06-21 LAB — CBC WITH DIFFERENTIAL/PLATELET
BASO%: 0.7 % (ref 0.0–2.0)
Basophils Absolute: 0.1 10*3/uL (ref 0.0–0.1)
EOS%: 3.4 % (ref 0.0–7.0)
Eosinophils Absolute: 0.4 10*3/uL (ref 0.0–0.5)
HCT: 35.5 % (ref 34.8–46.6)
HGB: 12.2 g/dL (ref 11.6–15.9)
LYMPH%: 13 % — ABNORMAL LOW (ref 14.0–49.7)
MCH: 32.2 pg (ref 25.1–34.0)
MCHC: 34.3 g/dL (ref 31.5–36.0)
MCV: 93.7 fL (ref 79.5–101.0)
MONO#: 0.9 10*3/uL (ref 0.1–0.9)
MONO%: 7.8 % (ref 0.0–14.0)
NEUT#: 8.3 10*3/uL — ABNORMAL HIGH (ref 1.5–6.5)
NEUT%: 75.1 % (ref 38.4–76.8)
Platelets: 142 10*3/uL — ABNORMAL LOW (ref 145–400)
RBC: 3.79 10*6/uL (ref 3.70–5.45)
RDW: 12.6 % (ref 11.2–14.5)
WBC: 11.1 10*3/uL — ABNORMAL HIGH (ref 3.9–10.3)
lymph#: 1.4 10*3/uL (ref 0.9–3.3)

## 2013-06-21 MED ORDER — SODIUM CHLORIDE 0.9 % IJ SOLN
10.0000 mL | INTRAMUSCULAR | Status: DC | PRN
  Administered 2013-06-21: 10 mL via INTRAVENOUS
  Filled 2013-06-21: qty 10

## 2013-06-21 MED ORDER — HEPARIN SOD (PORK) LOCK FLUSH 100 UNIT/ML IV SOLN
500.0000 [IU] | Freq: Once | INTRAVENOUS | Status: AC
  Administered 2013-06-21: 500 [IU] via INTRAVENOUS
  Filled 2013-06-21: qty 5

## 2013-06-21 NOTE — Progress Notes (Signed)
OFFICE PROGRESS NOTE  CC Dr. Maurice Small Dr. Claud Kelp Dr. 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 Patient had a lymph node enlarged there was biopsied and was consistent with metastatic ductal carcinoma. Diagnosed October 2013.  PRIOR THERAPY:  #1 patient underwent a screening mammogram in September 2013 that showed a right breast mass with associated calcifications. On MRI the mass measured 4.7 x 3.6 x 5.2 cm lymph node measuring 1.5 x 1.0 x 2.0 cm. Patient had a needle core biopsy performed that showed invasive mammary carcinoma ER positive PR positive HER-2/neu negative with Ki-67 elevated at 66%. The lymph node was biopsied and was consistent with metastatic ductal carcinoma.  #2 patient was seen in the multidisciplinary breast clinic for discussion of treatment options. She had a Oncotype DX testing performed on the biopsied tissue her recurrence score was 28 giving her 18% risk of distant recurrence with antiestrogen therapy. She was in the intermediate risk category. Patient was enrolled on neoadjuvant antiestrogen treatment clinical trial.  #3 she is currently receiving letrozole 2.5 mg starting in November 2013. She had ultrasound of the right breast performed that showed reduction in the size of the lymph nodes.  #4 patient also has prior history of left lumpectomy and radiation for DCIS treated in 2003. This tumor was apparently ER and PR negative so she did not receive adjuvant tamoxifen.  #5 patient is now status post right modified radical mastectomy with axillary lymph node dissection. The final pathology revealed 4.3 cm invasive ductal carcinoma 5 lymph nodes were positive for metastatic disease. Tumor ER +100% PR negative Ki-67 66%. Pathologic staging T2 N2. Patient's case was discussed at the multidisciplinary breast conference. Adjuvant  chemotherapy was recommended. I have spoken to the patient and I recommended either doing Taxotere Cytoxan every 3 weeks. However due Tuesday road usage patient may not be a good candidate for this. We therefore discussed the possibility of doing CMF every 21 days for a total of 6 cycles. Once she completes the chemotherapy then she will proceed to postmastectomy radiation therapy.  #6 CMF every 21 days adjuvantly to begin 05/23/2013  CURRENT THERAPY: Cycle 2 day 8 CMF  INTERVAL HISTORY: Mary Young 72 y.o. female returns for followup visit today.  She is doing well today, she tolerated the first cycle moderately well.  She had nausea managed with small snacks every 2 hours.  Today she denies fevers, chills, nausea,vomiting, constipation, diarrhea, numbness or any further concerns.  A 10 point ROS is negative.    MEDICAL HISTORY: Past Medical History  Diagnosis Date  . Breast cancer     left lumpectomy  . Hypertension   . Glaucoma   . Central retinal vein occlusion   . Joint pain   . Dyslipidemia   . H/O echocardiogram 09/27/2009    Note to have some aortic valve sclerosis, mild mitral valve prolapse, mild mitral regurgitation, and mild asymptomatic LVH with an EF>55%  . History of stress test 09/27/2009    Normal Myocardial perfusion study. No significant ischemia demonstrated, this low risk scan. compared to the previous study.,there is no significant change, Normal myocardial Perfusion imaging. EF>83%  . Seasonal allergies     ALLERGIES:  is allergic to vicodin; percocet; precipitated sulfur; and timolol.  MEDICATIONS:  Current Outpatient Prescriptions  Medication Sig Dispense Refill  . aspirin EC 81 MG tablet Take 81 mg by  mouth daily.      . B Complex-C (SUPER B COMPLEX PO) Take 1 tablet by mouth daily.       . Calcium-Vitamin D-Vitamin K 500-100-40 MG-UNT-MCG CHEW Chew 1 tablet by mouth daily.       . dorzolamide (TRUSOPT) 2 % ophthalmic solution Place 1 drop into both  eyes 2 (two) times daily.       Marland Kitchen lidocaine-prilocaine (EMLA) cream Apply topically as needed.  30 g  0  . loratadine-pseudoephedrine (CLARITIN-D 24-HOUR) 10-240 MG per 24 hr tablet Take 1 tablet by mouth daily.      Marland Kitchen LORazepam (ATIVAN) 0.5 MG tablet Take 1 tablet (0.5 mg total) by mouth every 6 (six) hours as needed (Nausea or vomiting).  30 tablet  0  . losartan (COZAAR) 50 MG tablet Take 50 mg by mouth daily.      . Multiple Vitamin (MULTIVITAMIN WITH MINERALS) TABS Take 1 tablet by mouth daily.      . timolol (TIMOPTIC) 0.5 % ophthalmic solution Place 1 drop into both eyes 2 (two) times daily. Non preservative      . TRAVATAN Z 0.004 % SOLN ophthalmic solution Place 1 drop into both eyes at bedtime.       . vitamin E 400 UNIT capsule Take 400 Units by mouth daily.      Marland Kitchen HYDROcodone-acetaminophen (NORCO/VICODIN) 5-325 MG per tablet Take 1-2 tablets by mouth every 4 (four) hours as needed for pain.  30 tablet  1  . prochlorperazine (COMPAZINE) 10 MG tablet Take 1 tablet (10 mg total) by mouth every 6 (six) hours as needed (Nausea or vomiting).  30 tablet  1  . prochlorperazine (COMPAZINE) 25 MG suppository Place 1 suppository (25 mg total) rectally every 12 (twelve) hours as needed for nausea.  12 suppository  3  . promethazine (PHENERGAN) 25 MG tablet Take 1 tablet (25 mg total) by mouth every 4 (four) hours as needed for nausea.  10 tablet  0   No current facility-administered medications for this visit.   Facility-Administered Medications Ordered in Other Visits  Medication Dose Route Frequency Provider Last Rate Last Dose  . 0.9 %  sodium chloride infusion   Intravenous Once Augustin Schooling, NP        SURGICAL HISTORY:  Past Surgical History  Procedure Laterality Date  . Abdominal hysterectomy  1991  . Breast lumpectomy  2003  . Arthroscopy knee w/ drilling  2005/2008    Left knee/Right knee  . Tubal ligation  1983  . Mastectomy modified radical Right 04/15/2013    Procedure:  MASTECTOMY MODIFIED RADICAL;  Surgeon: Ernestene Mention, MD;  Location: [facility] OR;  Service: General;  Laterality: Right;  . Portacath placement Left 05/13/2013    Procedure: INSERTION PORT-A-CATH;  Surgeon: Ernestene Mention, MD;  Location: [facility] OR;  Service: General;  Laterality: Left;    REVIEW OF SYSTEMS:  Pertinent items are noted in HPI.   HEALTH MAINTENANCE:   PHYSICAL EXAMINATION: Blood pressure 145/84, pulse 65, temperature 98.3 F (36.8 C), resp. rate 20, height 5' 3.5" (1.613 m), weight 130 lb 3.2 oz (59.058 kg). Body mass index is 22.7 kg/(m^2). General: Patient is a well appearing female in no acute distress HEENT: PERRLA, sclerae anicteric no conjunctival pallor, MMM Neck: supple, no palpable adenopathy Lungs: clear to auscultation bilaterally, no wheezes, rhonchi, or rales Cardiovascular: regular rate rhythm, S1, S2, no murmurs, rubs or gallops Abdomen: Soft, non-tender, non-distended, normoactive bowel sounds, no HSM Extremities: warm and well  perfused, no clubbing, cyanosis, or edema Skin: No rashes or lesions Neuro: Non-focal Left breast: Well-healed surgical scar in the inner lower quadrant.  No nodules, or masses Right mastectomy well healed, no nodularity.    ECOG PERFORMANCE STATUS: 0 - Asymptomatic      LABORATORY DATA: Lab Results  Component Value Date   WBC 11.1* 06/21/2013   HGB 12.2 06/21/2013   HCT 35.5 06/21/2013   MCV 93.7 06/21/2013   PLT 142* 06/21/2013      Chemistry      Component Value Date/Time   NA 144 06/21/2013 0704   NA 143 05/11/2013 0940   K 4.1 06/21/2013 0704   K 3.8 05/11/2013 0940   CL 106 05/11/2013 0940   CL 107 03/22/2013 0811   CO2 26 06/21/2013 0704   CO2 27 05/11/2013 0940   BUN 13.9 06/21/2013 0704   BUN 14 05/11/2013 0940   CREATININE 0.7 06/21/2013 0704   CREATININE 0.60 05/11/2013 0940      Component Value Date/Time   CALCIUM 9.3 06/21/2013 0704   CALCIUM 9.2 05/11/2013 0940   ALKPHOS 142 06/21/2013 0704   ALKPHOS 69 04/08/2013 0942   AST 17  06/21/2013 0704   AST 22 04/08/2013 0942   ALT 15 06/21/2013 0704   ALT 20 04/08/2013 0942   BILITOT 0.44 06/21/2013 0704   BILITOT 0.6 04/08/2013 0942     ADDITIONAL INFORMATION: PROGNOSTIC INDICATORS - ACIS Results: IMMUNOHISTOCHEMICAL AND MORPHOMETRIC ANALYSIS BY THE AUTOMATED CELLULAR IMAGING SYSTEM (ACIS) Estrogen Receptor: 100%, POSITIVE, STRONG STAINING INTENSITY Progesterone Receptor: 0%, NEGATIVE COMMENT: The negative hormone receptor study in this case has no internal positive control. REFERENCE RANGE ESTROGEN RECEPTOR NEGATIVE <1% POSITIVE =>1% PROGESTERONE RECEPTOR NEGATIVE <1% POSITIVE =>1% All controls stained appropriately Jimmy Picket MD Pathologist, Electronic Signature ( Signed 04/22/2013) CHROMOGENIC IN-SITU HYBRIDIZATION Results: HER-2/NEU BY CISH - NO AMPLIFICATION OF HER-2 DETECTED. RESULT RATIO OF HER2: CEP 17 SIGNALS 0.91 AVERAGE HER2 COPY NUMBER PER CELL 2.15 1 of 4 FINAL for BIRD, TAILOR ([id number]) ADDITIONAL INFORMATION:(continued) REFERENCE RANGE NEGATIVE HER2/Chr17 Ratio <2.0 and Average HER2 copy number <4.0 EQUIVOCAL HER2/Chr17 Ratio <2.0 and Average HER2 copy number 4.0 and <6.0 POSITIVE HER2/Chr17 Ratio >=2.0 and/or Average HER2 copy number >=6.0 Jimmy Picket MD Pathologist, Electronic Signature ( Signed 04/22/2013) FINAL DIAGNOSIS Diagnosis Breast, modified radical mastectomy , Right and axillary contents - RESIDUAL INVASIVE DUCAL CARCINOMA, NOTTINGHAM COMBINED HISTOLOGIC GRADE II, 4.3 CM - INTERMEDIATE GRADE DUCTAL CARCINOMA IN SITU. - ANGIOLYMPHATIC INVASION PRESENT. - EXTENSIVE STROMAL FIBROSIS, CONSISTENT WITH POST-TREATMENT EFFECT. - FIVE OF TWENTY-THREE LYMPH NODES, POSITIVE FOR METASTATIC CARCINOMA (5/23). - RESECTION MARGINS, NEGATIVE FOR ATYPIA OR MALIGNANCY. - PLEASE SEE ONCOLOGY TEMPLATE FOR DETAILS. Microscopic Comment BREAST, INVASIVE TUMOR, WITH LYMPH NODE SAMPLING Specimen, including laterality: Right breast and  axillary lymph nodes Procedure: Modified radical mastectomy with lymph node dissection Grade: II Tubule formation: 2 Nuclear pleomorphism: 2 Mitotic: 1 Tumor size (gross measurement): 4.3 cm (clusters of tumor cells contiguously spread throughout the sections; dense stromal fibrosis in the background, consistent with post-treatment effect) Margins: Negative Invasive, distance to closest margin: Deep margin: more than 1 cm In-situ, distance to closest margin: Deep margin: more then 1 cm Lymphovascular invasion: Present Ductal carcinoma in situ: Present Grade: Intermediate grade Extensive intraductal component: No Lobular neoplasia: N/A Tumor focality: Unifocal Treatment effect: Present, only in the breast tissue Extent of tumor: Skin: No Nipple: No Skeletal muscle: N/A Lymph nodes: # examined: 23 Lymph nodes with metastasis: 5 2 of 4 FINAL  for Mary Young, Mary Young ([id number]) Microscopic Comment(continued) Isolated tumor cells (< 0.2 mm): 0 Micrometastasis: (> 0.2 mm and < 2.0 mm): 0 Macrometastasis: (> 2.0 mm): 5 Extracapsular extension: No Breast prognostic profile: Please correlate with previous case [phone] Estrogen receptor: 100%, positive, strong staining intensity. Progesterone receptor: 96%, positive, strong staining intensity. Her 2 neu: No amplification, the ratio of Her 2:CEP 17 signals was 0.86 Ki-67: 66%; The breast prognostic profile will be repeated and an addendum report will follow. Non-neoplastic breast: Dense stromal fibrosis, consistent with post-treatment effect. TNM: ypT2, ypN2a Comments: A breast prognostic profile will be repeated and an addendum report will follow. Sections show invasive ductal carcinoma with micropapillary lobular features. Immunohistochemical stain for E-Cadherin was performed an the tumor cells are strongly positive for E-Cadherin. The tumor is contiguously spreading throughout the sections with associated fibrotic stroma in  the background; the morphologic features are mostly consistent with post-treatment effect. (HCL:kh 04-18-13) Abigail Miyamoto MD Pathologist, Electronic Signature (Case signed 04/20/2013) Specimen Gross and Clinical Information   RADIOGRAPHIC STUDIES:  No results found.  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 The tumor was ER positive PR negative HER-2/neu negative with an elevated Ki-67 of 66%. She is on neoadjuvant antiestrogen therapy consisting of letrozole 2.5 mg. She had an Oncotype DX testing performed on her biopsy tissue that put her in the intermediate risk category.   #2 patient is now status post right modified radical mastectomy with axillary lymph node dissection. She does have significant residual disease measuring 4.3 cm which is ER positive PR negative HER-2/neu negative. 5 of 23 lymph nodes were positive for metastatic disease. We discussed the pathology today  #3 patient with prior history of left breast cancer which was DCIS she underwent a lumpectomy followed by radiation.  #4 I have recommended chemotherapy consisting of CMF to be given every 3 weeks. We cannot use other agents do to her comorbidities and the use of steroids. Corticosteroid usage is contraindicated in patients do to her primary eye issues.  PLAN:  #1 patient is doing well overall she tolerated cycle 2 of chemotherapy.  #2 she did require Neulasta injection with cycle 2 of chemotherapy. However we will try to hold this with next cycle unless her ANC is less than 1.5.  #3 patient will return in 2 weeks' time for cycle #3 of her chemotherapy.  All questions were answered. The patient knows to call the clinic with any problems, questions or concerns. We can certainly see the patient much sooner if necessary.  I spent 25 minutes counseling the patient face to face. The total time spent in the appointment  was 30 minutes.   Drue Second, MD Medical/Oncology [facility] [phone] (beeper) [phone] (Office)  06/21/2013, 9:53 AM

## 2013-06-21 NOTE — Patient Instructions (Addendum)
Implanted Port Instructions  An implanted port is a central line that has a round shape and is placed under the skin. It is used for long-term IV (intravenous) access for:  · Medicine.  · Fluids.  · Liquid nutrition, such as TPN (total parenteral nutrition).  · Blood samples.  Ports can be placed:  · In the chest area just below the collarbone (this is the most common place.)  · In the arms.  · In the belly (abdomen) area.  · In the legs.  PARTS OF THE PORT  A port has 2 main parts:  · The reservoir. The reservoir is round, disc-shaped, and will be a small, raised area under your skin.  · The reservoir is the part where a needle is inserted (accessed) to either give medicines or to draw blood.  · The catheter. The catheter is a long, slender tube that extends from the reservoir. The catheter is placed into a large vein.  · Medicine that is inserted into the reservoir goes into the catheter and then into the vein.  INSERTION OF THE PORT  · The port is surgically placed in either an operating room or in a procedural area (interventional radiology).  · Medicine may be given to help you relax during the procedure.  · The skin where the port will be inserted is numbed (local anesthetic).  · 1 or 2 small cuts (incisions) will be made in the skin to insert the port.  · The port can be used after it has been inserted.  INCISION SITE CARE  · The incision site may have small adhesive strips on it. This helps keep the incision site closed. Sometimes, no adhesive strips are placed. Instead of adhesive strips, a special kind of surgical glue is used to keep the incision closed.  · If adhesive strips were placed on the incision sites, do not take them off. They will fall off on their own.  · The incision site may be sore for 1 to 2 days. Pain medicine can help.  · Do not get the incision site wet. Bathe or shower as directed by your caregiver.  · The incision site should heal in 5 to 7 days. A small scar may form after the  incision has healed.  ACCESSING THE PORT  Special steps must be taken to access the port:  · Before the port is accessed, a numbing cream can be placed on the skin. This helps numb the skin over the port site.  · A sterile technique is used to access the port.  · The port is accessed with a needle. Only "non-coring" port needles should be used to access the port. Once the port is accessed, a blood return should be checked. This helps ensure the port is in the vein and is not clogged (clotted).  · If your caregiver believes your port should remain accessed, a clear (transparent) bandage will be placed over the needle site. The bandage and needle will need to be changed every week or as directed by your caregiver.  · Keep the bandage covering the needle clean and dry. Do not get it wet. Follow your caregiver's instructions on how to take a shower or bath when the port is accessed.  · If your port does not need to stay accessed, no bandage is needed over the port.  FLUSHING THE PORT  Flushing the port keeps it from getting clogged. How often the port is flushed depends on:  · If a   constant infusion is running. If a constant infusion is running, the port may not need to be flushed.  · If intermittent medicines are given.  · If the port is not being used.  For intermittent medicines:  · The port will need to be flushed:  · After medicines have been given.  · After blood has been drawn.  · As part of routine maintenance.  · A port is normally flushed with:  · Normal saline.  · Heparin.  · Follow your caregiver's advice on how often, how much, and the type of flush to use on your port.  IMPORTANT PORT INFORMATION  · Tell your caregiver if you are allergic to heparin.  · After your port is placed, you will get a manufacturer's information card. The card has information about your port. Keep this card with you at all times.  · There are many types of ports available. Know what kind of port you have.  · In case of an  emergency, it may be helpful to wear a medical alert bracelet. This can help alert health care workers that you have a port.  · The port can stay in for as long as your caregiver believes it is necessary.  · When it is time for the port to come out, surgery will be done to remove it. The surgery will be similar to how the port was put in.  · If you are in the hospital or clinic:  · Your port will be taken care of and flushed by a nurse.  · If you are at home:  · A home health care nurse may give medicines and take care of the port.  · You or a family member can get special training and directions for giving medicine and taking care of the port at home.  SEEK IMMEDIATE MEDICAL CARE IF:   · Your port does not flush or you are unable to get a blood return.  · New drainage or pus is coming from the incision.  · A bad smell is coming from the incision site.  · You develop swelling or increased redness at the incision site.  · You develop increased swelling or pain at the port site.  · You develop swelling or pain in the surrounding skin near the port.  · You have an oral temperature above 102° F (38.9° C), not controlled by medicine.  MAKE SURE YOU:   · Understand these instructions.  · Will watch your condition.  · Will get help right away if you are not doing well or get worse.  Document Released: 11/03/2005 Document Revised: 01/26/2012 Document Reviewed: 01/25/2009  [employer]® Patient Information ©2014 [employer].

## 2013-06-22 ENCOUNTER — Other Ambulatory Visit: Payer: Self-pay

## 2013-06-22 ENCOUNTER — Ambulatory Visit: Payer: Medicare Other

## 2013-06-26 ENCOUNTER — Encounter: Payer: Self-pay | Admitting: Oncology

## 2013-06-28 ENCOUNTER — Telehealth: Payer: Self-pay | Admitting: *Deleted

## 2013-06-28 ENCOUNTER — Other Ambulatory Visit: Payer: Self-pay | Admitting: *Deleted

## 2013-06-28 ENCOUNTER — Encounter: Payer: Self-pay | Admitting: Adult Health

## 2013-06-28 NOTE — Telephone Encounter (Signed)
Lm informed the pt that her arrival time for 07/05/13 has changed. gv labs @ 8am, flush@8 :30, ov@9am , and tx to follow...td

## 2013-07-05 ENCOUNTER — Other Ambulatory Visit: Payer: Medicare Other | Admitting: Lab

## 2013-07-05 ENCOUNTER — Encounter: Payer: Self-pay | Admitting: Adult Health

## 2013-07-05 ENCOUNTER — Ambulatory Visit (HOSPITAL_BASED_OUTPATIENT_CLINIC_OR_DEPARTMENT_OTHER): Payer: Medicare Other | Admitting: Adult Health

## 2013-07-05 ENCOUNTER — Other Ambulatory Visit (HOSPITAL_BASED_OUTPATIENT_CLINIC_OR_DEPARTMENT_OTHER): Payer: Medicare Other | Admitting: Lab

## 2013-07-05 ENCOUNTER — Ambulatory Visit: Payer: Medicare Other

## 2013-07-05 ENCOUNTER — Ambulatory Visit (HOSPITAL_BASED_OUTPATIENT_CLINIC_OR_DEPARTMENT_OTHER): Payer: Medicare Other

## 2013-07-05 ENCOUNTER — Telehealth: Payer: Self-pay | Admitting: Oncology

## 2013-07-05 VITALS — BP 148/80 | HR 65 | Temp 97.0°F

## 2013-07-05 VITALS — BP 128/75 | HR 58 | Temp 98.0°F | Resp 20 | Ht 63.5 in | Wt 134.3 lb

## 2013-07-05 DIAGNOSIS — C50411 Malignant neoplasm of upper-outer quadrant of right female breast: Secondary | ICD-10-CM

## 2013-07-05 DIAGNOSIS — C773 Secondary and unspecified malignant neoplasm of axilla and upper limb lymph nodes: Secondary | ICD-10-CM

## 2013-07-05 DIAGNOSIS — Z5111 Encounter for antineoplastic chemotherapy: Secondary | ICD-10-CM

## 2013-07-05 DIAGNOSIS — Z853 Personal history of malignant neoplasm of breast: Secondary | ICD-10-CM

## 2013-07-05 DIAGNOSIS — C50419 Malignant neoplasm of upper-outer quadrant of unspecified female breast: Secondary | ICD-10-CM

## 2013-07-05 DIAGNOSIS — C50919 Malignant neoplasm of unspecified site of unspecified female breast: Secondary | ICD-10-CM

## 2013-07-05 DIAGNOSIS — Z170 Estrogen receptor positive status [ER+]: Secondary | ICD-10-CM

## 2013-07-05 DIAGNOSIS — C50911 Malignant neoplasm of unspecified site of right female breast: Secondary | ICD-10-CM

## 2013-07-05 LAB — CBC WITH DIFFERENTIAL/PLATELET
BASO%: 1.3 % (ref 0.0–2.0)
Basophils Absolute: 0.1 10*3/uL (ref 0.0–0.1)
EOS%: 2.9 % (ref 0.0–7.0)
Eosinophils Absolute: 0.2 10*3/uL (ref 0.0–0.5)
HCT: 33.9 % — ABNORMAL LOW (ref 34.8–46.6)
HGB: 11.9 g/dL (ref 11.6–15.9)
LYMPH%: 26.6 % (ref 14.0–49.7)
MCH: 33 pg (ref 25.1–34.0)
MCHC: 35.2 g/dL (ref 31.5–36.0)
MCV: 94 fL (ref 79.5–101.0)
MONO#: 0.5 10*3/uL (ref 0.1–0.9)
MONO%: 7.9 % (ref 0.0–14.0)
NEUT#: 3.9 10*3/uL (ref 1.5–6.5)
NEUT%: 61.3 % (ref 38.4–76.8)
Platelets: 225 10*3/uL (ref 145–400)
RBC: 3.61 10*6/uL — ABNORMAL LOW (ref 3.70–5.45)
RDW: 13.3 % (ref 11.2–14.5)
WBC: 6.4 10*3/uL (ref 3.9–10.3)
lymph#: 1.7 10*3/uL (ref 0.9–3.3)

## 2013-07-05 LAB — COMPREHENSIVE METABOLIC PANEL (CC13)
ALT: 32 U/L (ref 0–55)
AST: 20 U/L (ref 5–34)
Albumin: 3.4 g/dL — ABNORMAL LOW (ref 3.5–5.0)
Alkaline Phosphatase: 107 U/L (ref 40–150)
BUN: 19.8 mg/dL (ref 7.0–26.0)
CO2: 23 mEq/L (ref 22–29)
Calcium: 8.6 mg/dL (ref 8.4–10.4)
Chloride: 111 mEq/L — ABNORMAL HIGH (ref 98–109)
Creatinine: 0.7 mg/dL (ref 0.6–1.1)
Glucose: 100 mg/dl (ref 70–140)
Potassium: 4.3 mEq/L (ref 3.5–5.1)
Sodium: 144 mEq/L (ref 136–145)
Total Bilirubin: 0.4 mg/dL (ref 0.20–1.20)
Total Protein: 6.2 g/dL — ABNORMAL LOW (ref 6.4–8.3)

## 2013-07-05 MED ORDER — SODIUM CHLORIDE 0.9 % IV SOLN
600.0000 mg/m2 | Freq: Once | INTRAVENOUS | Status: AC
  Administered 2013-07-05: 1000 mg via INTRAVENOUS
  Filled 2013-07-05: qty 50

## 2013-07-05 MED ORDER — HEPARIN SOD (PORK) LOCK FLUSH 100 UNIT/ML IV SOLN
500.0000 [IU] | Freq: Once | INTRAVENOUS | Status: AC | PRN
  Administered 2013-07-05: 500 [IU]
  Filled 2013-07-05: qty 5

## 2013-07-05 MED ORDER — METHOTREXATE SODIUM CHEMO INJECTION 25 MG/ML
40.0000 mg/m2 | Freq: Once | INTRAMUSCULAR | Status: AC
  Administered 2013-07-05: 65 mg via INTRAVENOUS
  Filled 2013-07-05: qty 2.6

## 2013-07-05 MED ORDER — SODIUM CHLORIDE 0.9 % IV SOLN
Freq: Once | INTRAVENOUS | Status: AC
  Administered 2013-07-05: 10:00:00 via INTRAVENOUS

## 2013-07-05 MED ORDER — SODIUM CHLORIDE 0.9 % IV SOLN
Freq: Once | INTRAVENOUS | Status: AC
  Administered 2013-07-05: 11:00:00 via INTRAVENOUS

## 2013-07-05 MED ORDER — ONDANSETRON 8 MG/50ML IVPB (CHCC)
8.0000 mg | Freq: Once | INTRAVENOUS | Status: AC
  Administered 2013-07-05: 8 mg via INTRAVENOUS

## 2013-07-05 MED ORDER — SODIUM CHLORIDE 0.9 % IJ SOLN
10.0000 mL | INTRAMUSCULAR | Status: DC | PRN
  Administered 2013-07-05: 10 mL
  Filled 2013-07-05: qty 10

## 2013-07-05 MED ORDER — FLUOROURACIL CHEMO INJECTION 2.5 GM/50ML
600.0000 mg/m2 | Freq: Once | INTRAVENOUS | Status: AC
  Administered 2013-07-05: 1000 mg via INTRAVENOUS
  Filled 2013-07-05: qty 20

## 2013-07-05 MED ORDER — SODIUM CHLORIDE 0.9 % IJ SOLN
10.0000 mL | INTRAMUSCULAR | Status: DC | PRN
  Administered 2013-07-05: 10 mL via INTRAVENOUS
  Filled 2013-07-05: qty 10

## 2013-07-05 NOTE — Telephone Encounter (Signed)
Added flush appts for and moved 10/7 appt to 10-9 per 8/19 pof. Pt given new schedule while in inf.

## 2013-07-05 NOTE — Patient Instructions (Addendum)
Doing well.  Proceed with treatment. Please call us if you have any questions or concerns.    We will see you back next week.

## 2013-07-05 NOTE — Progress Notes (Signed)
OFFICE PROGRESS NOTE  CC Dr. Maurice Small Dr. Claud Kelp Dr. 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 Patient had a lymph node enlarged there was biopsied and was consistent with metastatic ductal carcinoma. Diagnosed October 2013.  PRIOR THERAPY:  #1 patient underwent a screening mammogram in September 2013 that showed a right breast mass with associated calcifications. On MRI the mass measured 4.7 x 3.6 x 5.2 cm lymph node measuring 1.5 x 1.0 x 2.0 cm. Patient had a needle core biopsy performed that showed invasive mammary carcinoma ER positive PR positive HER-2/neu negative with Ki-67 elevated at 66%. The lymph node was biopsied and was consistent with metastatic ductal carcinoma.  #2 patient was seen in the multidisciplinary breast clinic for discussion of treatment options. She had a Oncotype DX testing performed on the biopsied tissue her recurrence score was 28 giving her 18% risk of distant recurrence with antiestrogen therapy. She was in the intermediate risk category. Patient was enrolled on neoadjuvant antiestrogen treatment clinical trial.  #3 she is currently receiving letrozole 2.5 mg starting in November 2013. She had ultrasound of the right breast performed that showed reduction in the size of the lymph nodes.  #4 patient also has prior history of left lumpectomy and radiation for DCIS treated in 2003. This tumor was apparently ER and PR negative so she did not receive adjuvant tamoxifen.  #5 patient is now status post right modified radical mastectomy with axillary lymph node dissection. The final pathology revealed 4.3 cm invasive ductal carcinoma 5 lymph nodes were positive for metastatic disease. Tumor ER +100% PR negative Ki-67 66%. Pathologic staging T2 N2. Patient's case was discussed at the multidisciplinary breast conference. Adjuvant  chemotherapy was recommended. I have spoken to the patient and I recommended either doing Taxotere Cytoxan every 3 weeks. However due Tuesday road usage patient may not be a good candidate for this. We therefore discussed the possibility of doing CMF every 21 days for a total of 6 cycles. Once she completes the chemotherapy then she will proceed to postmastectomy radiation therapy.  #6 CMF every 21 days adjuvantly to begin 05/23/2013  CURRENT THERAPY: Cycle 3 day 1 CMF  INTERVAL HISTORY: Mary Young 72 y.o. female returns for followup visit today.  She is doing well today.  She denies fevers, chills, nausea, vomiting, constipation, diarrhea, numbness, shortness of breath, pain or any other concerns.  She feels like she has a seroma in her right axillary area that she would like to be evaluated.  It began 2 weeks ago, it is uncomfortable, she denies redness, warmth or pain.  She is doing well with her exercises after physical therapy.  Otherwise, a 10 point ROS is negative.    MEDICAL HISTORY: Past Medical History  Diagnosis Date  . Breast cancer     left lumpectomy  . Hypertension   . Glaucoma   . Central retinal vein occlusion   . Joint pain   . Dyslipidemia   . H/O echocardiogram 09/27/2009    Note to have some aortic valve sclerosis, mild mitral valve prolapse, mild mitral regurgitation, and mild asymptomatic LVH with an EF>55%  . History of stress test 09/27/2009    Normal Myocardial perfusion study. No significant ischemia demonstrated, this low risk scan. compared to the previous study.,there is no significant change, Normal myocardial Perfusion imaging. EF>83%  . Seasonal allergies  ALLERGIES:  is allergic to vicodin; percocet; precipitated sulfur; and timolol.  MEDICATIONS:  Current Outpatient Prescriptions  Medication Sig Dispense Refill  . aspirin EC 81 MG tablet Take 81 mg by mouth daily.      . B Complex-C (SUPER B COMPLEX PO) Take 1 tablet by mouth daily.        . Calcium-Vitamin D-Vitamin K 500-100-40 MG-UNT-MCG CHEW Chew 1 tablet by mouth daily.       . dorzolamide (TRUSOPT) 2 % ophthalmic solution Place 1 drop into both eyes 2 (two) times daily.       Marland Kitchen HYDROcodone-acetaminophen (NORCO/VICODIN) 5-325 MG per tablet Take 1-2 tablets by mouth every 4 (four) hours as needed for pain.  30 tablet  1  . lidocaine-prilocaine (EMLA) cream Apply topically as needed.  30 g  0  . loratadine-pseudoephedrine (CLARITIN-D 24-HOUR) 10-240 MG per 24 hr tablet Take 1 tablet by mouth daily.      Marland Kitchen LORazepam (ATIVAN) 0.5 MG tablet Take 1 tablet (0.5 mg total) by mouth every 6 (six) hours as needed (Nausea or vomiting).  30 tablet  0  . losartan (COZAAR) 50 MG tablet Take 50 mg by mouth daily.      . Multiple Vitamin (MULTIVITAMIN WITH MINERALS) TABS Take 1 tablet by mouth daily.      . prochlorperazine (COMPAZINE) 10 MG tablet Take 1 tablet (10 mg total) by mouth every 6 (six) hours as needed (Nausea or vomiting).  30 tablet  1  . prochlorperazine (COMPAZINE) 25 MG suppository Place 1 suppository (25 mg total) rectally every 12 (twelve) hours as needed for nausea.  12 suppository  3  . promethazine (PHENERGAN) 25 MG tablet Take 1 tablet (25 mg total) by mouth every 4 (four) hours as needed for nausea.  10 tablet  0  . simvastatin (ZOCOR) 20 MG tablet       . timolol (TIMOPTIC) 0.5 % ophthalmic solution Place 1 drop into both eyes 2 (two) times daily. Non preservative      . TRAVATAN Z 0.004 % SOLN ophthalmic solution Place 1 drop into both eyes at bedtime.       . vitamin E 400 UNIT capsule Take 400 Units by mouth daily.       No current facility-administered medications for this visit.   Facility-Administered Medications Ordered in Other Visits  Medication Dose Route Frequency Provider Last Rate Last Dose  . 0.9 %  sodium chloride infusion   Intravenous Once Augustin Schooling, NP        SURGICAL HISTORY:  Past Surgical History  Procedure Laterality Date  .  Abdominal hysterectomy  1991  . Breast lumpectomy  2003  . Arthroscopy knee w/ drilling  2005/2008    Left knee/Right knee  . Tubal ligation  1983  . Mastectomy modified radical Right 04/15/2013    Procedure: MASTECTOMY MODIFIED RADICAL;  Surgeon: Ernestene Mention, MD;  Location: [facility] OR;  Service: General;  Laterality: Right;  . Portacath placement Left 05/13/2013    Procedure: INSERTION PORT-A-CATH;  Surgeon: Ernestene Mention, MD;  Location: [facility] OR;  Service: General;  Laterality: Left;    REVIEW OF SYSTEMS:  Pertinent items are noted in HPI.   PHYSICAL EXAMINATION: Blood pressure 128/75, pulse 58, temperature 98 F (36.7 C), temperature source Oral, resp. rate 20, height 5' 3.5" (1.613 m), weight 134 lb 4.8 oz (60.918 kg). Body mass index is 23.41 kg/(m^2). General: Patient is a well appearing female in no acute distress HEENT: PERRLA, sclerae  anicteric no conjunctival pallor, MMM Neck: supple, no palpable adenopathy Lungs: clear to auscultation bilaterally, no wheezes, rhonchi, or rales Cardiovascular: regular rate rhythm, S1, S2, no murmurs, rubs or gallops Abdomen: Soft, non-tender, non-distended, normoactive bowel sounds, no HSM Extremities: warm and well perfused, no clubbing, cyanosis, or edema Skin: No rashes or lesions Neuro: Non-focal Left breast: Well-healed surgical scar in the inner lower quadrant.  No nodules, or masses, right underarm with excess skin, no seroma identified.   Right mastectomy well healed, no nodularity.   ECOG PERFORMANCE STATUS: 0 - Asymptomatic      LABORATORY DATA: Lab Results  Component Value Date   WBC 6.4 07/05/2013   HGB 11.9 07/05/2013   HCT 33.9* 07/05/2013   MCV 94.0 07/05/2013   PLT 225 07/05/2013      Chemistry      Component Value Date/Time   NA 144 07/05/2013 0755   NA 143 05/11/2013 0940   K 4.3 07/05/2013 0755   K 3.8 05/11/2013 0940   CL 106 05/11/2013 0940   CL 107 03/22/2013 0811   CO2 23 07/05/2013 0755   CO2 27 05/11/2013 0940    BUN 19.8 07/05/2013 0755   BUN 14 05/11/2013 0940   CREATININE 0.7 07/05/2013 0755   CREATININE 0.60 05/11/2013 0940      Component Value Date/Time   CALCIUM 8.6 07/05/2013 0755   CALCIUM 9.2 05/11/2013 0940   ALKPHOS 107 07/05/2013 0755   ALKPHOS 69 04/08/2013 0942   AST 20 07/05/2013 0755   AST 22 04/08/2013 0942   ALT 32 07/05/2013 0755   ALT 20 04/08/2013 0942   BILITOT 0.40 07/05/2013 0755   BILITOT 0.6 04/08/2013 0942     ADDITIONAL INFORMATION: PROGNOSTIC INDICATORS - ACIS Results: IMMUNOHISTOCHEMICAL AND MORPHOMETRIC ANALYSIS BY THE AUTOMATED CELLULAR IMAGING SYSTEM (ACIS) Estrogen Receptor: 100%, POSITIVE, STRONG STAINING INTENSITY Progesterone Receptor: 0%, NEGATIVE COMMENT: The negative hormone receptor study in this case has no internal positive control. REFERENCE RANGE ESTROGEN RECEPTOR NEGATIVE <1% POSITIVE =>1% PROGESTERONE RECEPTOR NEGATIVE <1% POSITIVE =>1% All controls stained appropriately Mary Picket MD Pathologist, Electronic Signature ( Signed 04/22/2013) CHROMOGENIC IN-SITU HYBRIDIZATION Results: HER-2/NEU BY CISH - NO AMPLIFICATION OF HER-2 DETECTED. RESULT RATIO OF HER2: CEP 17 SIGNALS 0.91 AVERAGE HER2 COPY NUMBER PER CELL 2.15 1 of 4 FINAL for Mary Young, Mary Young ([id number]) ADDITIONAL INFORMATION:(continued) REFERENCE RANGE NEGATIVE HER2/Chr17 Ratio <2.0 and Average HER2 copy number <4.0 EQUIVOCAL HER2/Chr17 Ratio <2.0 and Average HER2 copy number 4.0 and <6.0 POSITIVE HER2/Chr17 Ratio >=2.0 and/or Average HER2 copy number >=6.0 Mary Picket MD Pathologist, Electronic Signature ( Signed 04/22/2013) FINAL DIAGNOSIS Diagnosis Breast, modified radical mastectomy , Right and axillary contents - RESIDUAL INVASIVE DUCAL CARCINOMA, NOTTINGHAM COMBINED HISTOLOGIC GRADE II, 4.3 CM - INTERMEDIATE GRADE DUCTAL CARCINOMA IN SITU. - ANGIOLYMPHATIC INVASION PRESENT. - EXTENSIVE STROMAL FIBROSIS, CONSISTENT WITH POST-TREATMENT EFFECT. - FIVE OF  TWENTY-THREE LYMPH NODES, POSITIVE FOR METASTATIC CARCINOMA (5/23). - RESECTION MARGINS, NEGATIVE FOR ATYPIA OR MALIGNANCY. - PLEASE SEE ONCOLOGY TEMPLATE FOR DETAILS. Microscopic Comment BREAST, INVASIVE TUMOR, WITH LYMPH NODE SAMPLING Specimen, including laterality: Right breast and axillary lymph nodes Procedure: Modified radical mastectomy with lymph node dissection Grade: II Tubule formation: 2 Nuclear pleomorphism: 2 Mitotic: 1 Tumor size (gross measurement): 4.3 cm (clusters of tumor cells contiguously spread throughout the sections; dense stromal fibrosis in the background, consistent with post-treatment effect) Margins: Negative Invasive, distance to closest margin: Deep margin: more than 1 cm In-situ, distance to closest margin: Deep margin: more then 1 cm  Lymphovascular invasion: Present Ductal carcinoma in situ: Present Grade: Intermediate grade Extensive intraductal component: No Lobular neoplasia: N/A Tumor focality: Unifocal Treatment effect: Present, only in the breast tissue Extent of tumor: Skin: No Nipple: No Skeletal muscle: N/A Lymph nodes: # examined: 23 Lymph nodes with metastasis: 5 2 of 4 FINAL for Mary Young, Mary Young ([id number]) Microscopic Comment(continued) Isolated tumor cells (< 0.2 mm): 0 Micrometastasis: (> 0.2 mm and < 2.0 mm): 0 Macrometastasis: (> 2.0 mm): 5 Extracapsular extension: No Breast prognostic profile: Please correlate with previous case [phone] Estrogen receptor: 100%, positive, strong staining intensity. Progesterone receptor: 96%, positive, strong staining intensity. Her 2 neu: No amplification, the ratio of Her 2:CEP 17 signals was 0.86 Ki-67: 66%; The breast prognostic profile will be repeated and an addendum report will follow. Non-neoplastic breast: Dense stromal fibrosis, consistent with post-treatment effect. TNM: ypT2, ypN2a Comments: A breast prognostic profile will be repeated and an addendum report will follow.  Sections show invasive ductal carcinoma with micropapillary lobular features. Immunohistochemical stain for E-Cadherin was performed an the tumor cells are strongly positive for E-Cadherin. The tumor is contiguously spreading throughout the sections with associated fibrotic stroma in the background; the morphologic features are mostly consistent with post-treatment effect. (HCL:kh 04-18-13) Abigail Miyamoto MD Pathologist, Electronic Signature (Case signed 04/20/2013) Specimen Gross and Clinical Information   RADIOGRAPHIC STUDIES:  No results found.  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 The tumor was ER positive PR negative HER-2/neu negative with an elevated Ki-67 of 66%. She is on neoadjuvant antiestrogen therapy consisting of letrozole 2.5 mg. She had an Oncotype DX testing performed on her biopsy tissue that put her in the intermediate risk category.   #2 patient is now status post right modified radical mastectomy with axillary lymph node dissection. She does have significant residual disease measuring 4.3 cm which is ER positive PR negative HER-2/neu negative. 5 of 23 lymph nodes were positive for metastatic disease. We discussed the pathology today  #3 patient with prior history of left breast cancer which was DCIS she underwent a lumpectomy followed by radiation.  #4 I have recommended chemotherapy consisting of CMF to be given every 3 weeks. We cannot use other agents do to her comorbidities and the use of steroids. Corticosteroid usage is contraindicated in patients do to her primary eye issues.  Currently CMF cycle 3 day 1.    PLAN:  #1 Doing well.  She will proceed with cycle 3 of CMF today.  She will also receive IV fluids 1 L NS.    #2 She does not require Neulasta with this cycle as her ANC is 3900.  #3 patient will return in 1 weeks time for lab and evaluation after  chemotherapy.  I have set up flush appointments for the weeks following chemotherapy to have her labs drawn.   All questions were answered. The patient knows to call the clinic with any problems, questions or concerns. We can certainly see the patient much sooner if necessary.  I spent 25 minutes counseling the patient face to face. The total time spent in the appointment was 30 minutes.  Cherie Ouch Lyn Hollingshead, NP Medical Oncology [facility] Phone: [phone] 07/06/2013, 12:21 PM

## 2013-07-05 NOTE — Patient Instructions (Addendum)
[facility] Discharge Instructions for Patients Receiving Chemotherapy  Today you received the following chemotherapy agents: Cytoxan, Methotrexate, 5-FU  You also received a Normal Saline 1 liter bolus  To help prevent nausea and vomiting after your treatment, we encourage you to take your nausea medication as directed by your physician.   If you develop nausea and vomiting that is not controlled by your nausea medication, call the clinic.   BELOW ARE SYMPTOMS THAT SHOULD BE REPORTED IMMEDIATELY:  *FEVER GREATER THAN 100.5 F  *CHILLS WITH OR WITHOUT FEVER  NAUSEA AND VOMITING THAT IS NOT CONTROLLED WITH YOUR NAUSEA MEDICATION  *UNUSUAL SHORTNESS OF BREATH  *UNUSUAL BRUISING OR BLEEDING  TENDERNESS IN MOUTH AND THROAT WITH OR WITHOUT PRESENCE OF ULCERS  *URINARY PROBLEMS  *BOWEL PROBLEMS  UNUSUAL RASH Items with * indicate a potential emergency and should be followed up as soon as possible.  Feel free to call the clinic you have any questions or concerns. The clinic phone number is [phone].

## 2013-07-12 ENCOUNTER — Ambulatory Visit (HOSPITAL_BASED_OUTPATIENT_CLINIC_OR_DEPARTMENT_OTHER): Payer: Medicare Other

## 2013-07-12 ENCOUNTER — Encounter: Payer: Self-pay | Admitting: Oncology

## 2013-07-12 ENCOUNTER — Ambulatory Visit (HOSPITAL_BASED_OUTPATIENT_CLINIC_OR_DEPARTMENT_OTHER): Payer: Medicare Other | Admitting: Oncology

## 2013-07-12 ENCOUNTER — Other Ambulatory Visit: Payer: Medicare Other | Admitting: Lab

## 2013-07-12 ENCOUNTER — Other Ambulatory Visit: Payer: Medicare Other

## 2013-07-12 ENCOUNTER — Telehealth: Payer: Self-pay | Admitting: Oncology

## 2013-07-12 ENCOUNTER — Telehealth: Payer: Self-pay | Admitting: *Deleted

## 2013-07-12 VITALS — BP 130/75 | HR 62 | Temp 97.8°F | Resp 20 | Ht 63.5 in | Wt 131.8 lb

## 2013-07-12 VITALS — BP 144/64 | HR 65 | Temp 97.4°F

## 2013-07-12 DIAGNOSIS — C773 Secondary and unspecified malignant neoplasm of axilla and upper limb lymph nodes: Secondary | ICD-10-CM

## 2013-07-12 DIAGNOSIS — C50411 Malignant neoplasm of upper-outer quadrant of right female breast: Secondary | ICD-10-CM

## 2013-07-12 DIAGNOSIS — C50419 Malignant neoplasm of upper-outer quadrant of unspecified female breast: Secondary | ICD-10-CM

## 2013-07-12 DIAGNOSIS — C50919 Malignant neoplasm of unspecified site of unspecified female breast: Secondary | ICD-10-CM

## 2013-07-12 LAB — CBC WITH DIFFERENTIAL/PLATELET
BASO%: 2.1 % — ABNORMAL HIGH (ref 0.0–2.0)
Basophils Absolute: 0.1 10*3/uL (ref 0.0–0.1)
EOS%: 7 % (ref 0.0–7.0)
Eosinophils Absolute: 0.2 10*3/uL (ref 0.0–0.5)
HCT: 33.4 % — ABNORMAL LOW (ref 34.8–46.6)
HGB: 11.9 g/dL (ref 11.6–15.9)
LYMPH%: 28.9 % (ref 14.0–49.7)
MCH: 33 pg (ref 25.1–34.0)
MCHC: 35.5 g/dL (ref 31.5–36.0)
MCV: 92.9 fL (ref 79.5–101.0)
MONO#: 0.2 10*3/uL (ref 0.1–0.9)
MONO%: 4.9 % (ref 0.0–14.0)
NEUT#: 2 10*3/uL (ref 1.5–6.5)
NEUT%: 57.1 % (ref 38.4–76.8)
Platelets: 216 10*3/uL (ref 145–400)
RBC: 3.6 10*6/uL — ABNORMAL LOW (ref 3.70–5.45)
RDW: 13.1 % (ref 11.2–14.5)
WBC: 3.5 10*3/uL — ABNORMAL LOW (ref 3.9–10.3)
lymph#: 1 10*3/uL (ref 0.9–3.3)

## 2013-07-12 LAB — COMPREHENSIVE METABOLIC PANEL (CC13)
ALT: 18 U/L (ref 0–55)
AST: 15 U/L (ref 5–34)
Albumin: 3.5 g/dL (ref 3.5–5.0)
Alkaline Phosphatase: 88 U/L (ref 40–150)
BUN: 14.3 mg/dL (ref 7.0–26.0)
CO2: 27 mEq/L (ref 22–29)
Calcium: 9.2 mg/dL (ref 8.4–10.4)
Chloride: 109 mEq/L (ref 98–109)
Creatinine: 0.6 mg/dL (ref 0.6–1.1)
Glucose: 94 mg/dl (ref 70–140)
Potassium: 4.1 mEq/L (ref 3.5–5.1)
Sodium: 146 mEq/L — ABNORMAL HIGH (ref 136–145)
Total Bilirubin: 0.51 mg/dL (ref 0.20–1.20)
Total Protein: 6.3 g/dL — ABNORMAL LOW (ref 6.4–8.3)

## 2013-07-12 MED ORDER — SODIUM CHLORIDE 0.9 % IJ SOLN
10.0000 mL | INTRAMUSCULAR | Status: DC | PRN
  Administered 2013-07-12: 10 mL via INTRAVENOUS
  Filled 2013-07-12: qty 10

## 2013-07-12 MED ORDER — HEPARIN SOD (PORK) LOCK FLUSH 100 UNIT/ML IV SOLN
500.0000 [IU] | Freq: Once | INTRAVENOUS | Status: AC
  Administered 2013-07-12: 500 [IU] via INTRAVENOUS
  Filled 2013-07-12: qty 5

## 2013-07-12 NOTE — Patient Instructions (Addendum)
Doing well  Counts are good  We will see you back on 9/9 for cycle 4 of CMF

## 2013-07-12 NOTE — Telephone Encounter (Signed)
Per staff phone call and POF I have schedueld appts.  JMW  

## 2013-07-18 NOTE — Progress Notes (Signed)
OFFICE PROGRESS NOTE  CC Dr. Maurice Small Dr. Claud Kelp Dr. 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 Patient had a lymph node enlarged there was biopsied and was consistent with metastatic ductal carcinoma. Diagnosed October 2013.  PRIOR THERAPY:  #1 patient underwent a screening mammogram in September 2013 that showed a right breast mass with associated calcifications. On MRI the mass measured 4.7 x 3.6 x 5.2 cm lymph node measuring 1.5 x 1.0 x 2.0 cm. Patient had a needle core biopsy performed that showed invasive mammary carcinoma ER positive PR positive HER-2/neu negative with Ki-67 elevated at 66%. The lymph node was biopsied and was consistent with metastatic ductal carcinoma.  #2 patient was seen in the multidisciplinary breast clinic for discussion of treatment options. She had a Oncotype DX testing performed on the biopsied tissue her recurrence score was 28 giving her 18% risk of distant recurrence with antiestrogen therapy. She was in the intermediate risk category. Patient was enrolled on neoadjuvant antiestrogen treatment clinical trial.  #3 she is status post neoadjuvantletrozole 2.5 mg starting in November 2013 - June 2014.   #4 patient also has prior history of left lumpectomy and radiation for DCIS treated in 2003. This tumor was apparently ER and PR negative so she did not receive adjuvant tamoxifen.  #5 patient is now status post right modified radical mastectomy with axillary lymph node dissection. The final pathology revealed 4.3 cm invasive ductal carcinoma 5 lymph nodes were positive for metastatic disease. Tumor ER +100% PR negative Ki-67 66%. Pathologic staging T2 N2. Patient's case was discussed at the multidisciplinary breast conference. Adjuvant chemotherapy was recommended. I have spoken to the patient and I recommended either doing  Taxotere Cytoxan every 3 weeks. However due Tuesday road usage patient may not be a good candidate for this. We therefore discussed the possibility of doing CMF every 21 days for a total of 6 cycles. Once she completes the chemotherapy then she will proceed to postmastectomy radiation therapy.  #6 CMF every 21 days adjuvantly to begin 05/23/2013  CURRENT THERAPY: Cycle 3 day 1 CMF  INTERVAL HISTORY: Mary Young 72 y.o. female returns for followup visit today.  She is doing well today.  She denies fevers, chills, nausea, vomiting, constipation, diarrhea, numbness, shortness of breath, pain or any other concerns.  She feels like she has a seroma in her right axillary area that she would like to be evaluated.  It began 2 weeks ago, it is uncomfortable, she denies redness, warmth or pain.  She is doing well with her exercises after physical therapy.  Otherwise, a 10 point ROS is negative.    MEDICAL HISTORY: Past Medical History  Diagnosis Date  . Breast cancer     left lumpectomy  . Hypertension   . Glaucoma   . Central retinal vein occlusion   . Joint pain   . Dyslipidemia   . H/O echocardiogram 09/27/2009    Note to have some aortic valve sclerosis, mild mitral valve prolapse, mild mitral regurgitation, and mild asymptomatic LVH with an EF>55%  . History of stress test 09/27/2009    Normal Myocardial perfusion study. No significant ischemia demonstrated, this low risk scan. compared to the previous study.,there is no significant change, Normal myocardial Perfusion imaging. EF>83%  . Seasonal allergies     ALLERGIES:  is allergic to vicodin; percocet; precipitated sulfur; and timolol.  MEDICATIONS:  Current Outpatient Prescriptions  Medication Sig Dispense Refill  . aspirin EC 81 MG tablet Take 81 mg by mouth daily.      . B Complex-C (SUPER B COMPLEX PO) Take 1 tablet by mouth daily.       . Calcium-Vitamin D-Vitamin K 500-100-40 MG-UNT-MCG CHEW Chew 1 tablet by mouth daily.        . dorzolamide (TRUSOPT) 2 % ophthalmic solution Place 1 drop into both eyes 2 (two) times daily.       . fish oil-omega-3 fatty acids 1000 MG capsule Take 2 g by mouth daily.      Marland Kitchen losartan (COZAAR) 50 MG tablet Take 50 mg by mouth daily.      . Multiple Vitamin (MULTIVITAMIN WITH MINERALS) TABS Take 1 tablet by mouth daily.      . simvastatin (ZOCOR) 20 MG tablet       . timolol (TIMOPTIC) 0.5 % ophthalmic solution Place 1 drop into both eyes 2 (two) times daily. Non preservative      . TRAVATAN Z 0.004 % SOLN ophthalmic solution Place 1 drop into both eyes at bedtime.       . vitamin E 400 UNIT capsule Take 400 Units by mouth daily.      Marland Kitchen HYDROcodone-acetaminophen (NORCO/VICODIN) 5-325 MG per tablet Take 1-2 tablets by mouth every 4 (four) hours as needed for pain.  30 tablet  1  . lidocaine-prilocaine (EMLA) cream Apply topically as needed.  30 g  0  . loratadine-pseudoephedrine (CLARITIN-D 24-HOUR) 10-240 MG per 24 hr tablet Take 1 tablet by mouth daily.      Marland Kitchen LORazepam (ATIVAN) 0.5 MG tablet Take 1 tablet (0.5 mg total) by mouth every 6 (six) hours as needed (Nausea or vomiting).  30 tablet  0  . prochlorperazine (COMPAZINE) 10 MG tablet Take 1 tablet (10 mg total) by mouth every 6 (six) hours as needed (Nausea or vomiting).  30 tablet  1  . prochlorperazine (COMPAZINE) 25 MG suppository Place 1 suppository (25 mg total) rectally every 12 (twelve) hours as needed for nausea.  12 suppository  3  . promethazine (PHENERGAN) 25 MG tablet Take 1 tablet (25 mg total) by mouth every 4 (four) hours as needed for nausea.  10 tablet  0   No current facility-administered medications for this visit.   Facility-Administered Medications Ordered in Other Visits  Medication Dose Route Frequency Provider Last Rate Last Dose  . 0.9 %  sodium chloride infusion   Intravenous Once Augustin Schooling, NP        SURGICAL HISTORY:  Past Surgical History  Procedure Laterality Date  . Abdominal  hysterectomy  1991  . Breast lumpectomy  2003  . Arthroscopy knee w/ drilling  2005/2008    Left knee/Right knee  . Tubal ligation  1983  . Mastectomy modified radical Right 04/15/2013    Procedure: MASTECTOMY MODIFIED RADICAL;  Surgeon: Ernestene Mention, MD;  Location[facility];  Service: General;  Laterality: Right;  . Portacath placement Left 05/13/2013    Procedure: INSERTION PORT-A-CATH;  Surgeon: Ernestene Mention, MD;  Location: [facility] OR;  Service: General;  Laterality: Left;    REVIEW OF SYSTEMS:  Pertinent items are noted in HPI.   PHYSICAL EXAMINATION: Blood pressure 130/75, pulse 62, temperature 97.8 F (36.6 C), temperature source Oral, resp. rate 20, height 5' 3.5" (1.613 m), weight 131 lb 12.8 oz (59.784 kg). Body mass index is 22.98 kg/(m^2). General: Patient is a well appearing female in no  acute distress HEENT: PERRLA, sclerae anicteric no conjunctival pallor, MMM Neck: supple, no palpable adenopathy Lungs: clear to auscultation bilaterally, no wheezes, rhonchi, or rales Cardiovascular: regular rate rhythm, S1, S2, no murmurs, rubs or gallops Abdomen: Soft, non-tender, non-distended, normoactive bowel sounds, no HSM Extremities: warm and well perfused, no clubbing, cyanosis, or edema Skin: No rashes or lesions Neuro: Non-focal Left breast: Well-healed surgical scar in the inner lower quadrant.  No nodules, or masses, right underarm with excess skin, no seroma identified.   Right mastectomy well healed, no nodularity.   ECOG PERFORMANCE STATUS: 0 - Asymptomatic      LABORATORY DATA: Lab Results  Component Value Date   WBC 3.5* 07/12/2013   HGB 11.9 07/12/2013   HCT 33.4* 07/12/2013   MCV 92.9 07/12/2013   PLT 216 07/12/2013      Chemistry      Component Value Date/Time   NA 146* 07/12/2013 0817   NA 143 05/11/2013 0940   K 4.1 07/12/2013 0817   K 3.8 05/11/2013 0940   CL 106 05/11/2013 0940   CL 107 03/22/2013 0811   CO2 27 07/12/2013 0817   CO2 27 05/11/2013 0940   BUN  14.3 07/12/2013 0817   BUN 14 05/11/2013 0940   CREATININE 0.6 07/12/2013 0817   CREATININE 0.60 05/11/2013 0940      Component Value Date/Time   CALCIUM 9.2 07/12/2013 0817   CALCIUM 9.2 05/11/2013 0940   ALKPHOS 88 07/12/2013 0817   ALKPHOS 69 04/08/2013 0942   AST 15 07/12/2013 0817   AST 22 04/08/2013 0942   ALT 18 07/12/2013 0817   ALT 20 04/08/2013 0942   BILITOT 0.51 07/12/2013 0817   BILITOT 0.6 04/08/2013 0942     ADDITIONAL INFORMATION: PROGNOSTIC INDICATORS - ACIS Results: IMMUNOHISTOCHEMICAL AND MORPHOMETRIC ANALYSIS BY THE AUTOMATED CELLULAR IMAGING SYSTEM (ACIS) Estrogen Receptor: 100%, POSITIVE, STRONG STAINING INTENSITY Progesterone Receptor: 0%, NEGATIVE COMMENT: The negative hormone receptor study in this case has no internal positive control. REFERENCE RANGE ESTROGEN RECEPTOR NEGATIVE <1% POSITIVE =>1% PROGESTERONE RECEPTOR NEGATIVE <1% POSITIVE =>1% All controls stained appropriately Jimmy Picket MD Pathologist, Electronic Signature ( Signed 04/22/2013) CHROMOGENIC IN-SITU HYBRIDIZATION Results: HER-2/NEU BY CISH - NO AMPLIFICATION OF HER-2 DETECTED. RESULT RATIO OF HER2: CEP 17 SIGNALS 0.91 AVERAGE HER2 COPY NUMBER PER CELL 2.15 1 of 4 FINAL for TYRA, GURAL ([id number]) ADDITIONAL INFORMATION:(continued) REFERENCE RANGE NEGATIVE HER2/Chr17 Ratio <2.0 and Average HER2 copy number <4.0 EQUIVOCAL HER2/Chr17 Ratio <2.0 and Average HER2 copy number 4.0 and <6.0 POSITIVE HER2/Chr17 Ratio >=2.0 and/or Average HER2 copy number >=6.0 Jimmy Picket MD Pathologist, Electronic Signature ( Signed 04/22/2013) FINAL DIAGNOSIS Diagnosis Breast, modified radical mastectomy , Right and axillary contents - RESIDUAL INVASIVE DUCAL CARCINOMA, NOTTINGHAM COMBINED HISTOLOGIC GRADE II, 4.3 CM - INTERMEDIATE GRADE DUCTAL CARCINOMA IN SITU. - ANGIOLYMPHATIC INVASION PRESENT. - EXTENSIVE STROMAL FIBROSIS, CONSISTENT WITH POST-TREATMENT EFFECT. - FIVE OF TWENTY-THREE  LYMPH NODES, POSITIVE FOR METASTATIC CARCINOMA (5/23). - RESECTION MARGINS, NEGATIVE FOR ATYPIA OR MALIGNANCY. - PLEASE SEE ONCOLOGY TEMPLATE FOR DETAILS. Microscopic Comment BREAST, INVASIVE TUMOR, WITH LYMPH NODE SAMPLING Specimen, including laterality: Right breast and axillary lymph nodes Procedure: Modified radical mastectomy with lymph node dissection Grade: II Tubule formation: 2 Nuclear pleomorphism: 2 Mitotic: 1 Tumor size (gross measurement): 4.3 cm (clusters of tumor cells contiguously spread throughout the sections; dense stromal fibrosis in the background, consistent with post-treatment effect) Margins: Negative Invasive, distance to closest margin: Deep margin: more than 1 cm In-situ, distance to closest margin: Deep  margin: more then 1 cm Lymphovascular invasion: Present Ductal carcinoma in situ: Present Grade: Intermediate grade Extensive intraductal component: No Lobular neoplasia: N/A Tumor focality: Unifocal Treatment effect: Present, only in the breast tissue Extent of tumor: Skin: No Nipple: No Skeletal muscle: N/A Lymph nodes: # examined: 23 Lymph nodes with metastasis: 5 2 of 4 FINAL for DORETHEA, STRUBEL ([id number]) Microscopic Comment(continued) Isolated tumor cells (< 0.2 mm): 0 Micrometastasis: (> 0.2 mm and < 2.0 mm): 0 Macrometastasis: (> 2.0 mm): 5 Extracapsular extension: No Breast prognostic profile: Please correlate with previous case [phone] Estrogen receptor: 100%, positive, strong staining intensity. Progesterone receptor: 96%, positive, strong staining intensity. Her 2 neu: No amplification, the ratio of Her 2:CEP 17 signals was 0.86 Ki-67: 66%; The breast prognostic profile will be repeated and an addendum report will follow. Non-neoplastic breast: Dense stromal fibrosis, consistent with post-treatment effect. TNM: ypT2, ypN2a Comments: A breast prognostic profile will be repeated and an addendum report will follow. Sections  show invasive ductal carcinoma with micropapillary lobular features. Immunohistochemical stain for E-Cadherin was performed an the tumor cells are strongly positive for E-Cadherin. The tumor is contiguously spreading throughout the sections with associated fibrotic stroma in the background; the morphologic features are mostly consistent with post-treatment effect. (HCL:kh 04-18-13) Abigail Miyamoto MD Pathologist, Electronic Signature (Case signed 04/20/2013) Specimen Gross and Clinical Information   RADIOGRAPHIC STUDIES:  No results found.  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 The tumor was ER positive PR negative HER-2/neu negative with an elevated Ki-67 of 66%. She is on neoadjuvant antiestrogen therapy consisting of letrozole 2.5 mg. She had an Oncotype DX testing performed on her biopsy tissue that put her in the intermediate risk category.   #2 patient is now status post right modified radical mastectomy with axillary lymph node dissection. She does have significant residual disease measuring 4.3 cm which is ER positive PR negative HER-2/neu negative. 5 of 23 lymph nodes were positive for metastatic disease. We discussed the pathology today  #3 patient with prior history of left breast cancer which was DCIS she underwent a lumpectomy followed by radiation.  #4 I have recommended chemotherapy consisting of CMF to be given every 3 weeks. We cannot use other agents do to her comorbidities and the use of steroids. Corticosteroid usage is contraindicated in patients do to her primary eye issues.  Currently CMF cycle 3 day 1.    PLAN:  #1 status post cycle 3 of her chemotherapy. Overall she's doing well without any problems.  #2 she will return in 2 weeks' time for cycle 4 of her treatment her  All questions were answered. The patient knows to call the clinic with any problems,  questions or concerns. We can certainly see the patient much sooner if necessary.  I spent 25 minutes counseling the patient face to face. The total time spent in the appointment was 30 minutes.  Drue Second, MD Medical/Oncology [facility] [phone] (beeper) [phone] (Office)

## 2013-07-26 ENCOUNTER — Ambulatory Visit (HOSPITAL_BASED_OUTPATIENT_CLINIC_OR_DEPARTMENT_OTHER): Payer: Medicare Other | Admitting: Oncology

## 2013-07-26 ENCOUNTER — Other Ambulatory Visit: Payer: Medicare Other | Admitting: Lab

## 2013-07-26 ENCOUNTER — Encounter: Payer: Self-pay | Admitting: Oncology

## 2013-07-26 ENCOUNTER — Other Ambulatory Visit (HOSPITAL_BASED_OUTPATIENT_CLINIC_OR_DEPARTMENT_OTHER): Payer: Medicare Other | Admitting: Lab

## 2013-07-26 ENCOUNTER — Ambulatory Visit: Payer: Medicare Other | Admitting: Adult Health

## 2013-07-26 ENCOUNTER — Telehealth: Payer: Self-pay | Admitting: Oncology

## 2013-07-26 ENCOUNTER — Ambulatory Visit (HOSPITAL_BASED_OUTPATIENT_CLINIC_OR_DEPARTMENT_OTHER): Payer: Medicare Other

## 2013-07-26 VITALS — BP 162/85 | HR 75 | Temp 97.5°F | Resp 18 | Ht 63.0 in | Wt 134.5 lb

## 2013-07-26 DIAGNOSIS — C50411 Malignant neoplasm of upper-outer quadrant of right female breast: Secondary | ICD-10-CM

## 2013-07-26 DIAGNOSIS — C773 Secondary and unspecified malignant neoplasm of axilla and upper limb lymph nodes: Secondary | ICD-10-CM

## 2013-07-26 DIAGNOSIS — C50911 Malignant neoplasm of unspecified site of right female breast: Secondary | ICD-10-CM

## 2013-07-26 DIAGNOSIS — C50919 Malignant neoplasm of unspecified site of unspecified female breast: Secondary | ICD-10-CM

## 2013-07-26 DIAGNOSIS — C50419 Malignant neoplasm of upper-outer quadrant of unspecified female breast: Secondary | ICD-10-CM

## 2013-07-26 DIAGNOSIS — Z5111 Encounter for antineoplastic chemotherapy: Secondary | ICD-10-CM

## 2013-07-26 LAB — CBC WITH DIFFERENTIAL/PLATELET
BASO%: 0.9 % (ref 0.0–2.0)
Basophils Absolute: 0 10*3/uL (ref 0.0–0.1)
EOS%: 7.1 % — ABNORMAL HIGH (ref 0.0–7.0)
Eosinophils Absolute: 0.3 10*3/uL (ref 0.0–0.5)
HCT: 36.7 % (ref 34.8–46.6)
HGB: 12.7 g/dL (ref 11.6–15.9)
LYMPH%: 46 % (ref 14.0–49.7)
MCH: 32.4 pg (ref 25.1–34.0)
MCHC: 34.6 g/dL (ref 31.5–36.0)
MCV: 93.6 fL (ref 79.5–101.0)
MONO#: 0.4 10*3/uL (ref 0.1–0.9)
MONO%: 12.2 % (ref 0.0–14.0)
NEUT#: 1.2 10*3/uL — ABNORMAL LOW (ref 1.5–6.5)
NEUT%: 33.8 % — ABNORMAL LOW (ref 38.4–76.8)
Platelets: 196 10*3/uL (ref 145–400)
RBC: 3.92 10*6/uL (ref 3.70–5.45)
RDW: 12.9 % (ref 11.2–14.5)
WBC: 3.5 10*3/uL — ABNORMAL LOW (ref 3.9–10.3)
lymph#: 1.6 10*3/uL (ref 0.9–3.3)
nRBC: 0 % (ref 0–0)

## 2013-07-26 LAB — COMPREHENSIVE METABOLIC PANEL (CC13)
ALT: 23 U/L (ref 0–55)
AST: 23 U/L (ref 5–34)
Albumin: 3.7 g/dL (ref 3.5–5.0)
Alkaline Phosphatase: 97 U/L (ref 40–150)
BUN: 14.1 mg/dL (ref 7.0–26.0)
CO2: 29 mEq/L (ref 22–29)
Calcium: 9.9 mg/dL (ref 8.4–10.4)
Chloride: 105 mEq/L (ref 98–109)
Creatinine: 0.7 mg/dL (ref 0.6–1.1)
Glucose: 133 mg/dl (ref 70–140)
Potassium: 3.8 mEq/L (ref 3.5–5.1)
Sodium: 142 mEq/L (ref 136–145)
Total Bilirubin: 0.64 mg/dL (ref 0.20–1.20)
Total Protein: 6.6 g/dL (ref 6.4–8.3)

## 2013-07-26 MED ORDER — FLUOROURACIL CHEMO INJECTION 2.5 GM/50ML
600.0000 mg/m2 | Freq: Once | INTRAVENOUS | Status: AC
  Administered 2013-07-26: 1000 mg via INTRAVENOUS
  Filled 2013-07-26: qty 20

## 2013-07-26 MED ORDER — SODIUM CHLORIDE 0.9 % IJ SOLN
10.0000 mL | INTRAMUSCULAR | Status: DC | PRN
  Administered 2013-07-26: 10 mL
  Filled 2013-07-26: qty 10

## 2013-07-26 MED ORDER — METHOTREXATE SODIUM CHEMO INJECTION 25 MG/ML
40.0000 mg/m2 | Freq: Once | INTRAMUSCULAR | Status: AC
  Administered 2013-07-26: 65 mg via INTRAVENOUS
  Filled 2013-07-26: qty 2.6

## 2013-07-26 MED ORDER — HEPARIN SOD (PORK) LOCK FLUSH 100 UNIT/ML IV SOLN
500.0000 [IU] | Freq: Once | INTRAVENOUS | Status: AC | PRN
  Administered 2013-07-26: 500 [IU]
  Filled 2013-07-26: qty 5

## 2013-07-26 MED ORDER — SODIUM CHLORIDE 0.9 % IV SOLN
600.0000 mg/m2 | Freq: Once | INTRAVENOUS | Status: AC
  Administered 2013-07-26: 1000 mg via INTRAVENOUS
  Filled 2013-07-26: qty 50

## 2013-07-26 MED ORDER — ONDANSETRON 8 MG/50ML IVPB (CHCC)
8.0000 mg | Freq: Once | INTRAVENOUS | Status: AC
  Administered 2013-07-26: 8 mg via INTRAVENOUS

## 2013-07-26 MED ORDER — SODIUM CHLORIDE 0.9 % IV SOLN
Freq: Once | INTRAVENOUS | Status: AC
  Administered 2013-07-26: 12:00:00 via INTRAVENOUS

## 2013-07-26 MED ORDER — ONDANSETRON 8 MG/NS 50 ML IVPB
INTRAVENOUS | Status: AC
  Filled 2013-07-26: qty 8

## 2013-07-26 NOTE — Progress Notes (Signed)
OFFICE PROGRESS NOTE  CC Dr. Maurice Small Dr. Claud Kelp Dr. Lurline Hare  DIAGNOSIS: 71 year old female with 5.2 cm invasive mammary carcinoma that was ER positive PR positive HER-2/neu negative with an elevated Ki-67 of 66%. Patient had a lymph node enlarged there was biopsied and was consistent with metastatic ductal carcinoma. Diagnosed October 2013.  PRIOR THERAPY:  #1 patient underwent a screening mammogram in September 2013 that showed a right breast mass with associated calcifications. On MRI the mass measured 4.7 x 3.6 x 5.2 cm lymph node measuring 1.5 x 1.0 x 2.0 cm. Patient had a needle core biopsy performed that showed invasive mammary carcinoma ER positive PR positive HER-2/neu negative with Ki-67 elevated at 66%. The lymph node was biopsied and was consistent with metastatic ductal carcinoma.  #2 patient was seen in the multidisciplinary breast clinic for discussion of treatment options. She had a Oncotype DX testing performed on the biopsied tissue her recurrence score was 28 giving her 18% risk of distant recurrence with antiestrogen therapy. She was in the intermediate risk category. Patient was enrolled on neoadjuvant antiestrogen treatment clinical trial.  #3 she is status post neoadjuvantletrozole 2.5 mg starting in November 2013 - June 2014.   #4 patient also has prior history of left lumpectomy and radiation for DCIS treated in 2003. This tumor was apparently ER and PR negative so she did not receive adjuvant tamoxifen.  #5 patient is now status post right modified radical mastectomy with axillary lymph node dissection. The final pathology revealed 4.3 cm invasive ductal carcinoma 5 lymph nodes were positive for metastatic disease. Tumor ER +100% PR negative Ki-67 66%. Pathologic staging T2 N2. Patient's case was discussed at the multidisciplinary breast conference. Adjuvant chemotherapy was recommended. I have spoken to the patient and I recommended either doing  Taxotere Cytoxan every 3 weeks. However due Tuesday road usage patient may not be a good candidate for this. We therefore discussed the possibility of doing CMF every 21 days for a total of 6 cycles. Once she completes the chemotherapy then she will proceed to postmastectomy radiation therapy.  #6 CMF every 21 days adjuvantly to begin 05/23/2013  CURRENT THERAPY: Cycle 4 day 1 CMF  INTERVAL HISTORY: Mary Young 72 y.o. female returns for followup visit today.  She is doing well today.  She denies fevers, chills, nausea, vomiting, constipation, diarrhea, numbness, shortness of breath, pain or any other concerns.  She feels like she has a seroma in her right axillary area that she would like to be evaluated.  It began 2 weeks ago, it is uncomfortable, she denies redness, warmth or pain.  She is doing well with her exercises after physical therapy.  Otherwise, a 10 point ROS is negative.    MEDICAL HISTORY: Past Medical History  Diagnosis Date  . Breast cancer     left lumpectomy  . Hypertension   . Glaucoma   . Central retinal vein occlusion   . Joint pain   . Dyslipidemia   . H/O echocardiogram 09/27/2009    Note to have some aortic valve sclerosis, mild mitral valve prolapse, mild mitral regurgitation, and mild asymptomatic LVH with an EF>55%  . History of stress test 09/27/2009    Normal Myocardial perfusion study. No significant ischemia demonstrated, this low risk scan. compared to the previous study.,there is no significant change, Normal myocardial Perfusion imaging. EF>83%  . Seasonal allergies     ALLERGIES:  is allergic to vicodin; percocet; precipitated sulfur; and timolol.  MEDICATIONS:  Current Outpatient Prescriptions  Medication Sig Dispense Refill  . aspirin EC 81 MG tablet Take 81 mg by mouth daily.      . B Complex-C (SUPER B COMPLEX PO) Take 1 tablet by mouth daily.       . Calcium-Vitamin D-Vitamin K 500-100-40 MG-UNT-MCG CHEW Chew 1 tablet by mouth daily.        . dorzolamide (TRUSOPT) 2 % ophthalmic solution Place 1 drop into both eyes 2 (two) times daily.       . fish oil-omega-3 fatty acids 1000 MG capsule Take 2 g by mouth daily.      Marland Kitchen lidocaine-prilocaine (EMLA) cream Apply topically as needed.  30 g  0  . loratadine-pseudoephedrine (CLARITIN-D 24-HOUR) 10-240 MG per 24 hr tablet Take 1 tablet by mouth daily.      Marland Kitchen LORazepam (ATIVAN) 0.5 MG tablet Take 1 tablet (0.5 mg total) by mouth every 6 (six) hours as needed (Nausea or vomiting).  30 tablet  0  . losartan (COZAAR) 50 MG tablet Take 50 mg by mouth daily.      . Multiple Vitamin (MULTIVITAMIN WITH MINERALS) TABS Take 1 tablet by mouth daily.      . simvastatin (ZOCOR) 20 MG tablet       . timolol (TIMOPTIC) 0.5 % ophthalmic solution Place 1 drop into both eyes 2 (two) times daily. Non preservative      . TRAVATAN Z 0.004 % SOLN ophthalmic solution Place 1 drop into both eyes at bedtime.       . vitamin E 400 UNIT capsule Take 400 Units by mouth daily.      . prochlorperazine (COMPAZINE) 10 MG tablet Take 1 tablet (10 mg total) by mouth every 6 (six) hours as needed (Nausea or vomiting).  30 tablet  1  . prochlorperazine (COMPAZINE) 25 MG suppository Place 1 suppository (25 mg total) rectally every 12 (twelve) hours as needed for nausea.  12 suppository  3  . promethazine (PHENERGAN) 25 MG tablet Take 1 tablet (25 mg total) by mouth every 4 (four) hours as needed for nausea.  10 tablet  0   No current facility-administered medications for this visit.   Facility-Administered Medications Ordered in Other Visits  Medication Dose Route Frequency Provider Last Rate Last Dose  . 0.9 %  sodium chloride infusion   Intravenous Once Augustin Schooling, NP        SURGICAL HISTORY:  Past Surgical History  Procedure Laterality Date  . Abdominal hysterectomy  1991  . Breast lumpectomy  2003  . Arthroscopy knee w/ drilling  2005/2008    Left knee/Right knee  . Tubal ligation  1983  . Mastectomy  modified radical Right 04/15/2013    Procedure: MASTECTOMY MODIFIED RADICAL;  Surgeon: Ernestene Mention, MD;  Location: [facility] OR;  Service: General;  Laterality: Right;  . Portacath placement Left 05/13/2013    Procedure: INSERTION PORT-A-CATH;  Surgeon: Ernestene Mention, MD;  Location: [facility] OR;  Service: General;  Laterality: Left;    REVIEW OF SYSTEMS:  Pertinent items are noted in HPI.   PHYSICAL EXAMINATION: Blood pressure 162/85, pulse 75, temperature 97.5 F (36.4 C), temperature source Oral, resp. rate 18, height 5\' 3"  (1.6 m), weight 134 lb 8 oz (61.009 kg). Body mass index is 23.83 kg/(m^2). General: Patient is a well appearing female in no acute distress HEENT: PERRLA, sclerae anicteric no conjunctival pallor, MMM Neck: supple, no palpable adenopathy Lungs: clear to auscultation bilaterally, no wheezes, rhonchi, or rales Cardiovascular:  regular rate rhythm, S1, S2, no murmurs, rubs or gallops Abdomen: Soft, non-tender, non-distended, normoactive bowel sounds, no HSM Extremities: warm and well perfused, no clubbing, cyanosis, or edema Skin: No rashes or lesions Neuro: Non-focal Left breast: Well-healed surgical scar in the inner lower quadrant.  No nodules, or masses, right underarm with excess skin, no seroma identified.   Right mastectomy well healed, no nodularity.   ECOG PERFORMANCE STATUS: 0 - Asymptomatic      LABORATORY DATA: Lab Results  Component Value Date   WBC 3.5* 07/26/2013   HGB 12.7 07/26/2013   HCT 36.7 07/26/2013   MCV 93.6 07/26/2013   PLT 196 07/26/2013      Chemistry      Component Value Date/Time   NA 146* 07/12/2013 0817   NA 143 05/11/2013 0940   K 4.1 07/12/2013 0817   K 3.8 05/11/2013 0940   CL 106 05/11/2013 0940   CL 107 03/22/2013 0811   CO2 27 07/12/2013 0817   CO2 27 05/11/2013 0940   BUN 14.3 07/12/2013 0817   BUN 14 05/11/2013 0940   CREATININE 0.6 07/12/2013 0817   CREATININE 0.60 05/11/2013 0940      Component Value Date/Time   CALCIUM 9.2 07/12/2013  0817   CALCIUM 9.2 05/11/2013 0940   ALKPHOS 88 07/12/2013 0817   ALKPHOS 69 04/08/2013 0942   AST 15 07/12/2013 0817   AST 22 04/08/2013 0942   ALT 18 07/12/2013 0817   ALT 20 04/08/2013 0942   BILITOT 0.51 07/12/2013 0817   BILITOT 0.6 04/08/2013 0942     ADDITIONAL INFORMATION: PROGNOSTIC INDICATORS - ACIS Results: IMMUNOHISTOCHEMICAL AND MORPHOMETRIC ANALYSIS BY THE AUTOMATED CELLULAR IMAGING SYSTEM (ACIS) Estrogen Receptor: 100%, POSITIVE, STRONG STAINING INTENSITY Progesterone Receptor: 0%, NEGATIVE COMMENT: The negative hormone receptor study in this case has no internal positive control. REFERENCE RANGE ESTROGEN RECEPTOR NEGATIVE <1% POSITIVE =>1% PROGESTERONE RECEPTOR NEGATIVE <1% POSITIVE =>1% All controls stained appropriately Jimmy Picket MD Pathologist, Electronic Signature ( Signed 04/22/2013) CHROMOGENIC IN-SITU HYBRIDIZATION Results: HER-2/NEU BY CISH - NO AMPLIFICATION OF HER-2 DETECTED. RESULT RATIO OF HER2: CEP 17 SIGNALS 0.91 AVERAGE HER2 COPY NUMBER PER CELL 2.15 1 of 4 FINAL for Mary Young, Mary Young ([id number]) ADDITIONAL INFORMATION:(continued) REFERENCE RANGE NEGATIVE HER2/Chr17 Ratio <2.0 and Average HER2 copy number <4.0 EQUIVOCAL HER2/Chr17 Ratio <2.0 and Average HER2 copy number 4.0 and <6.0 POSITIVE HER2/Chr17 Ratio >=2.0 and/or Average HER2 copy number >=6.0 Jimmy Picket MD Pathologist, Electronic Signature ( Signed 04/22/2013) FINAL DIAGNOSIS Diagnosis Breast, modified radical mastectomy , Right and axillary contents - RESIDUAL INVASIVE DUCAL CARCINOMA, NOTTINGHAM COMBINED HISTOLOGIC GRADE II, 4.3 CM - INTERMEDIATE GRADE DUCTAL CARCINOMA IN SITU. - ANGIOLYMPHATIC INVASION PRESENT. - EXTENSIVE STROMAL FIBROSIS, CONSISTENT WITH POST-TREATMENT EFFECT. - FIVE OF TWENTY-THREE LYMPH NODES, POSITIVE FOR METASTATIC CARCINOMA (5/23). - RESECTION MARGINS, NEGATIVE FOR ATYPIA OR MALIGNANCY. - PLEASE SEE ONCOLOGY TEMPLATE FOR DETAILS. Microscopic  Comment BREAST, INVASIVE TUMOR, WITH LYMPH NODE SAMPLING Specimen, including laterality: Right breast and axillary lymph nodes Procedure: Modified radical mastectomy with lymph node dissection Grade: II Tubule formation: 2 Nuclear pleomorphism: 2 Mitotic: 1 Tumor size (gross measurement): 4.3 cm (clusters of tumor cells contiguously spread throughout the sections; dense stromal fibrosis in the background, consistent with post-treatment effect) Margins: Negative Invasive, distance to closest margin: Deep margin: more than 1 cm In-situ, distance to closest margin: Deep margin: more then 1 cm Lymphovascular invasion: Present Ductal carcinoma in situ: Present Grade: Intermediate grade Extensive intraductal component: No Lobular neoplasia: N/A Tumor focality: Unifocal  Treatment effect: Present, only in the breast tissue Extent of tumor: Skin: No Nipple: No Skeletal muscle: N/A Lymph nodes: # examined: 23 Lymph nodes with metastasis: 5 2 of 4 FINAL for Mary Young, Mary Young ([id number]) Microscopic Comment(continued) Isolated tumor cells (< 0.2 mm): 0 Micrometastasis: (> 0.2 mm and < 2.0 mm): 0 Macrometastasis: (> 2.0 mm): 5 Extracapsular extension: No Breast prognostic profile: Please correlate with previous case [phone] Estrogen receptor: 100%, positive, strong staining intensity. Progesterone receptor: 96%, positive, strong staining intensity. Her 2 neu: No amplification, the ratio of Her 2:CEP 17 signals was 0.86 Ki-67: 66%; The breast prognostic profile will be repeated and an addendum report will follow. Non-neoplastic breast: Dense stromal fibrosis, consistent with post-treatment effect. TNM: ypT2, ypN2a Comments: A breast prognostic profile will be repeated and an addendum report will follow. Sections show invasive ductal carcinoma with micropapillary lobular features. Immunohistochemical stain for E-Cadherin was performed an the tumor cells are strongly positive for  E-Cadherin. The tumor is contiguously spreading throughout the sections with associated fibrotic stroma in the background; the morphologic features are mostly consistent with post-treatment effect. (HCL:kh 04-18-13) Abigail Miyamoto MD Pathologist, Electronic Signature (Case signed 04/20/2013) Specimen Gross and Clinical Information   RADIOGRAPHIC STUDIES:  No results found.  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 The tumor was ER positive PR negative HER-2/neu negative with an elevated Ki-67 of 66%. She is on neoadjuvant antiestrogen therapy consisting of letrozole 2.5 mg. She had an Oncotype DX testing performed on her biopsy tissue that put her in the intermediate risk category.   #2 patient is now status post right modified radical mastectomy with axillary lymph node dissection. She does have significant residual disease measuring 4.3 cm which is ER positive PR negative HER-2/neu negative. 5 of 23 lymph nodes were positive for metastatic disease. We discussed the pathology today  #3 patient with prior history of left breast cancer which was DCIS she underwent a lumpectomy followed by radiation.  #4 patient was begun on adjuvant chemotherapy consisting of CMF. Thus far she has received a total of 3 cycles. Today she is here for cycle #4 day 1 of her chemotherapy overall she is tolerating it well.  PLAN:  #1Proceed with cycle 4 of CMF.  #2 she'll return in one week's time for followup.  All questions were answered. The patient knows to call the clinic with any problems, questions or concerns. We can certainly see the patient much sooner if necessary.  I spent 25 minutes counseling the patient face to face. The total time spent in the appointment was 30 minutes.  Drue Second, MD Medical/Oncology [facility] [phone] (beeper) [phone] (Office)

## 2013-07-26 NOTE — Patient Instructions (Signed)
[facility] Discharge Instructions for Patients Receiving Chemotherapy  Today you received the following chemotherapy agents:  Cytoxan, methotrexate, 1fu  To help prevent nausea and vomiting after your treatment, we encourage you to take your nausea medication.  Take it as often as prescribed.     If you develop nausea and vomiting that is not controlled by your nausea medication, call the clinic. If it is after clinic hours your family physician or the after hours number for the clinic or go to the Emergency Department.   BELOW ARE SYMPTOMS THAT SHOULD BE REPORTED IMMEDIATELY:  *FEVER GREATER THAN 100.5 F  *CHILLS WITH OR WITHOUT FEVER  NAUSEA AND VOMITING THAT IS NOT CONTROLLED WITH YOUR NAUSEA MEDICATION  *UNUSUAL SHORTNESS OF BREATH  *UNUSUAL BRUISING OR BLEEDING  TENDERNESS IN MOUTH AND THROAT WITH OR WITHOUT PRESENCE OF ULCERS  *URINARY PROBLEMS  *BOWEL PROBLEMS  UNUSUAL RASH Items with * indicate a potential emergency and should be followed up as soon as possible.  Feel free to call the clinic you have any questions or concerns. The clinic phone number is [phone].   I have been informed and understand all the instructions given to me. I know to contact the clinic, my physician, or go to the Emergency Department if any problems should occur. I do not have any questions at this time, but understand that I may call the clinic during office hours   should I have any questions or need assistance in obtaining follow up care.    __________________________________________  _____________  __________ Signature of Patient or Authorized Representative            Date                   Time    __________________________________________ Nurse's Signature

## 2013-07-26 NOTE — Progress Notes (Signed)
OK to treat with ANC 1.2 per Dr. Welton Flakes

## 2013-07-27 ENCOUNTER — Ambulatory Visit (HOSPITAL_BASED_OUTPATIENT_CLINIC_OR_DEPARTMENT_OTHER): Payer: Medicare Other

## 2013-07-27 VITALS — BP 140/67 | HR 61 | Temp 97.9°F

## 2013-07-27 DIAGNOSIS — C773 Secondary and unspecified malignant neoplasm of axilla and upper limb lymph nodes: Secondary | ICD-10-CM

## 2013-07-27 DIAGNOSIS — C50911 Malignant neoplasm of unspecified site of right female breast: Secondary | ICD-10-CM

## 2013-07-27 DIAGNOSIS — C50419 Malignant neoplasm of upper-outer quadrant of unspecified female breast: Secondary | ICD-10-CM

## 2013-07-27 DIAGNOSIS — C50411 Malignant neoplasm of upper-outer quadrant of right female breast: Secondary | ICD-10-CM

## 2013-07-27 MED ORDER — PEGFILGRASTIM INJECTION 6 MG/0.6ML
6.0000 mg | Freq: Once | SUBCUTANEOUS | Status: AC
  Administered 2013-07-27: 6 mg via SUBCUTANEOUS
  Filled 2013-07-27: qty 0.6

## 2013-08-02 ENCOUNTER — Other Ambulatory Visit (HOSPITAL_BASED_OUTPATIENT_CLINIC_OR_DEPARTMENT_OTHER): Payer: Medicare Other | Admitting: Lab

## 2013-08-02 ENCOUNTER — Ambulatory Visit: Payer: Medicare Other

## 2013-08-02 ENCOUNTER — Other Ambulatory Visit: Payer: Medicare Other | Admitting: Lab

## 2013-08-02 ENCOUNTER — Ambulatory Visit (HOSPITAL_BASED_OUTPATIENT_CLINIC_OR_DEPARTMENT_OTHER): Payer: Medicare Other | Admitting: Adult Health

## 2013-08-02 ENCOUNTER — Encounter: Payer: Self-pay | Admitting: Adult Health

## 2013-08-02 VITALS — BP 146/75 | HR 72 | Temp 98.0°F | Resp 18 | Ht 63.0 in | Wt 131.1 lb

## 2013-08-02 VITALS — BP 149/71 | HR 70 | Temp 98.3°F

## 2013-08-02 DIAGNOSIS — C50919 Malignant neoplasm of unspecified site of unspecified female breast: Secondary | ICD-10-CM

## 2013-08-02 DIAGNOSIS — C773 Secondary and unspecified malignant neoplasm of axilla and upper limb lymph nodes: Secondary | ICD-10-CM

## 2013-08-02 DIAGNOSIS — C50419 Malignant neoplasm of upper-outer quadrant of unspecified female breast: Secondary | ICD-10-CM

## 2013-08-02 DIAGNOSIS — C50411 Malignant neoplasm of upper-outer quadrant of right female breast: Secondary | ICD-10-CM

## 2013-08-02 LAB — CBC WITH DIFFERENTIAL/PLATELET
BASO%: 0.4 % (ref 0.0–2.0)
Basophils Absolute: 0 10*3/uL (ref 0.0–0.1)
EOS%: 5.2 % (ref 0.0–7.0)
Eosinophils Absolute: 0.5 10*3/uL (ref 0.0–0.5)
HCT: 35.1 % (ref 34.8–46.6)
HGB: 12 g/dL (ref 11.6–15.9)
LYMPH%: 13.6 % — ABNORMAL LOW (ref 14.0–49.7)
MCH: 32 pg (ref 25.1–34.0)
MCHC: 34.2 g/dL (ref 31.5–36.0)
MCV: 93.6 fL (ref 79.5–101.0)
MONO#: 0.7 10*3/uL (ref 0.1–0.9)
MONO%: 7.9 % (ref 0.0–14.0)
NEUT#: 6.8 10*3/uL — ABNORMAL HIGH (ref 1.5–6.5)
NEUT%: 72.9 % (ref 38.4–76.8)
Platelets: 134 10*3/uL — ABNORMAL LOW (ref 145–400)
RBC: 3.75 10*6/uL (ref 3.70–5.45)
RDW: 12.9 % (ref 11.2–14.5)
WBC: 9.3 10*3/uL (ref 3.9–10.3)
lymph#: 1.3 10*3/uL (ref 0.9–3.3)
nRBC: 0 % (ref 0–0)

## 2013-08-02 LAB — COMPREHENSIVE METABOLIC PANEL (CC13)
ALT: 16 U/L (ref 0–55)
AST: 16 U/L (ref 5–34)
Albumin: 3.6 g/dL (ref 3.5–5.0)
Alkaline Phosphatase: 138 U/L (ref 40–150)
BUN: 14.3 mg/dL (ref 7.0–26.0)
CO2: 30 mEq/L — ABNORMAL HIGH (ref 22–29)
Calcium: 9.7 mg/dL (ref 8.4–10.4)
Chloride: 107 mEq/L (ref 98–109)
Creatinine: 0.7 mg/dL (ref 0.6–1.1)
Glucose: 94 mg/dl (ref 70–140)
Potassium: 4.1 mEq/L (ref 3.5–5.1)
Sodium: 144 mEq/L (ref 136–145)
Total Bilirubin: 0.5 mg/dL (ref 0.20–1.20)
Total Protein: 6.3 g/dL — ABNORMAL LOW (ref 6.4–8.3)

## 2013-08-02 MED ORDER — HEPARIN SOD (PORK) LOCK FLUSH 100 UNIT/ML IV SOLN
500.0000 [IU] | Freq: Once | INTRAVENOUS | Status: AC
  Administered 2013-08-02: 500 [IU] via INTRAVENOUS
  Filled 2013-08-02: qty 5

## 2013-08-02 MED ORDER — SODIUM CHLORIDE 0.9 % IJ SOLN
10.0000 mL | INTRAMUSCULAR | Status: DC | PRN
  Administered 2013-08-02: 10 mL via INTRAVENOUS
  Filled 2013-08-02: qty 10

## 2013-08-02 NOTE — Patient Instructions (Signed)
Doing well.  Labs are stable after chemotherapy.  We will see you back on 08/16/13 for cycle 5.  Please call us if you have any questions or concerns.

## 2013-08-02 NOTE — Progress Notes (Signed)
OFFICE PROGRESS NOTE  CC Dr. Maurice Small Dr. Claud Kelp Dr. 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 Patient had a lymph node enlarged there was biopsied and was consistent with metastatic ductal carcinoma. Diagnosed October 2013.  PRIOR THERAPY:  #1 patient underwent a screening mammogram in September 2013 that showed a right breast mass with associated calcifications. On MRI the mass measured 4.7 x 3.6 x 5.2 cm lymph node measuring 1.5 x 1.0 x 2.0 cm. Patient had a needle core biopsy performed that showed invasive mammary carcinoma ER positive PR positive HER-2/neu negative with Ki-67 elevated at 66%. The lymph node was biopsied and was consistent with metastatic ductal carcinoma.  #2 patient was seen in the multidisciplinary breast clinic for discussion of treatment options. She had a Oncotype DX testing performed on the biopsied tissue her recurrence score was 28 giving her 18% risk of distant recurrence with antiestrogen therapy. She was in the intermediate risk category. Patient was enrolled on neoadjuvant antiestrogen treatment clinical trial.  #3 she is status post neoadjuvantletrozole 2.5 mg starting in November 2013 - June 2014.   #4 patient also has prior history of left lumpectomy and radiation for DCIS treated in 2003. This tumor was apparently ER and PR negative so she did not receive adjuvant tamoxifen.  #5 patient is now status post right modified radical mastectomy with axillary lymph node dissection. The final pathology revealed 4.3 cm invasive ductal carcinoma 5 lymph nodes were positive for metastatic disease. Tumor ER +100% PR negative Ki-67 66%. Pathologic staging T2 N2. Patient's case was discussed at the multidisciplinary breast conference. Adjuvant chemotherapy was recommended. I have spoken to the patient and I recommended either doing  Taxotere Cytoxan every 3 weeks. However due Tuesday road usage patient may not be a good candidate for this. We therefore discussed the possibility of doing CMF every 21 days for a total of 6 cycles. Once she completes the chemotherapy then she will proceed to postmastectomy radiation therapy.  #6 CMF every 21 days adjuvantly to begin 05/23/2013  CURRENT THERAPY: Cycle 4 day 8  INTERVAL HISTORY: Mary Young 72 y.o. female returns for followup visit today.  She is doing well today.  She was increasingly tired after this cycle of chemotherapy.  She did receive Neulasta after this cycle. Her appetite was decreased from Wednesday to Saturday following chemotherapy.  She is drinking 4 bottles of water a day in addition to other fluids.  She had mild nausea relieved by her anti-emetics.  She had one episode of constipation and took prunes for this and it resolved.  She denies fevers, chills, vomiting, diarrhea, numbness, skin changes, pain.     MEDICAL HISTORY: Past Medical History  Diagnosis Date  . Breast cancer     left lumpectomy  . Hypertension   . Glaucoma   . Central retinal vein occlusion   . Joint pain   . Dyslipidemia   . H/O echocardiogram 09/27/2009    Note to have some aortic valve sclerosis, mild mitral valve prolapse, mild mitral regurgitation, and mild asymptomatic LVH with an EF>55%  . History of stress test 09/27/2009    Normal Myocardial perfusion study. No significant ischemia demonstrated, this low risk scan. compared to the previous study.,there is no significant change, Normal myocardial Perfusion imaging. EF>83%  . Seasonal allergies     ALLERGIES:  is allergic to vicodin; percocet; precipitated  sulfur; and timolol.  MEDICATIONS:  Current Outpatient Prescriptions  Medication Sig Dispense Refill  . aspirin EC 81 MG tablet Take 81 mg by mouth daily.      . B Complex-C (SUPER B COMPLEX PO) Take 1 tablet by mouth daily.       . Calcium-Vitamin D-Vitamin K  500-100-40 MG-UNT-MCG CHEW Chew 1 tablet by mouth daily.       . Cinnamon 500 MG capsule Take 500 mg by mouth daily.      . dorzolamide (TRUSOPT) 2 % ophthalmic solution Place 1 drop into both eyes 2 (two) times daily.       Marland Kitchen lidocaine-prilocaine (EMLA) cream Apply topically as needed.  30 g  0  . LORazepam (ATIVAN) 0.5 MG tablet Take 1 tablet (0.5 mg total) by mouth every 6 (six) hours as needed (Nausea or vomiting).  30 tablet  0  . losartan (COZAAR) 50 MG tablet Take 50 mg by mouth daily.      . Multiple Vitamin (MULTIVITAMIN WITH MINERALS) TABS Take 1 tablet by mouth daily.      . prochlorperazine (COMPAZINE) 10 MG tablet Take 1 tablet (10 mg total) by mouth every 6 (six) hours as needed (Nausea or vomiting).  30 tablet  1  . simvastatin (ZOCOR) 20 MG tablet       . timolol (TIMOPTIC) 0.5 % ophthalmic solution Place 1 drop into both eyes 2 (two) times daily. Non preservative      . TRAVATAN Z 0.004 % SOLN ophthalmic solution Place 1 drop into both eyes at bedtime.       . vitamin E 400 UNIT capsule Take 400 Units by mouth daily.      . fish oil-omega-3 fatty acids 1000 MG capsule Take 2 g by mouth daily.      Marland Kitchen loratadine-pseudoephedrine (CLARITIN-D 24-HOUR) 10-240 MG per 24 hr tablet Take 1 tablet by mouth daily.      . prochlorperazine (COMPAZINE) 25 MG suppository Place 1 suppository (25 mg total) rectally every 12 (twelve) hours as needed for nausea.  12 suppository  3  . promethazine (PHENERGAN) 25 MG tablet Take 1 tablet (25 mg total) by mouth every 4 (four) hours as needed for nausea.  10 tablet  0   No current facility-administered medications for this visit.   Facility-Administered Medications Ordered in Other Visits  Medication Dose Route Frequency Provider Last Rate Last Dose  . 0.9 %  sodium chloride infusion   Intravenous Once Augustin Schooling, NP        SURGICAL HISTORY:  Past Surgical History  Procedure Laterality Date  . Abdominal hysterectomy  1991  . Breast  lumpectomy  2003  . Arthroscopy knee w/ drilling  2005/2008    Left knee/Right knee  . Tubal ligation  1983  . Mastectomy modified radical Right 04/15/2013    Procedure: MASTECTOMY MODIFIED RADICAL;  Surgeon: Ernestene Mention, MD;  Location: [facility] OR;  Service: General;  Laterality: Right;  . Portacath placement Left 05/13/2013    Procedure: INSERTION PORT-A-CATH;  Surgeon: Ernestene Mention, MD;  Location: [facility] OR;  Service: General;  Laterality: Left;    REVIEW OF SYSTEMS:  A 10 point review of systems was conducted and is otherwise negative except for what is noted above.      PHYSICAL EXAMINATION: Blood pressure 146/75, pulse 72, temperature 98 F (36.7 C), temperature source Oral, resp. rate 18, height 5\' 3"  (1.6 m), weight 131 lb 1.6 oz (59.467 kg), SpO2 99.00%. Body mass  index is 23.23 kg/(m^2). General: Patient is a well appearing female in no acute distress HEENT: PERRLA, sclerae anicteric no conjunctival pallor, MMM Neck: supple, no palpable adenopathy Lungs: clear to auscultation bilaterally, no wheezes, rhonchi, or rales Cardiovascular: regular rate rhythm, S1, S2, no murmurs, rubs or gallops Abdomen: Soft, non-tender, non-distended, normoactive bowel sounds, no HSM Extremities: warm and well perfused, no clubbing, cyanosis, or edema Skin: No rashes or lesions Neuro: Non-focal Left breast: Well-healed surgical scar in the inner lower quadrant.  No nodules, or masses, right underarm with excess skin, no seroma identified.   Right mastectomy well healed, no nodularity.   ECOG PERFORMANCE STATUS: 0 - Asymptomatic      LABORATORY DATA: Lab Results  Component Value Date   WBC 9.3 08/02/2013   HGB 12.0 08/02/2013   HCT 35.1 08/02/2013   MCV 93.6 08/02/2013   PLT 134* 08/02/2013      Chemistry      Component Value Date/Time   NA 144 08/02/2013 0830   NA 143 05/11/2013 0940   K 4.1 08/02/2013 0830   K 3.8 05/11/2013 0940   CL 106 05/11/2013 0940   CL 107 03/22/2013 0811   CO2 30*  08/02/2013 0830   CO2 27 05/11/2013 0940   BUN 14.3 08/02/2013 0830   BUN 14 05/11/2013 0940   CREATININE 0.7 08/02/2013 0830   CREATININE 0.60 05/11/2013 0940      Component Value Date/Time   CALCIUM 9.7 08/02/2013 0830   CALCIUM 9.2 05/11/2013 0940   ALKPHOS 138 08/02/2013 0830   ALKPHOS 69 04/08/2013 0942   AST 16 08/02/2013 0830   AST 22 04/08/2013 0942   ALT 16 08/02/2013 0830   ALT 20 04/08/2013 0942   BILITOT 0.50 08/02/2013 0830   BILITOT 0.6 04/08/2013 0942     ADDITIONAL INFORMATION: PROGNOSTIC INDICATORS - ACIS Results: IMMUNOHISTOCHEMICAL AND MORPHOMETRIC ANALYSIS BY THE AUTOMATED CELLULAR IMAGING SYSTEM (ACIS) Estrogen Receptor: 100%, POSITIVE, STRONG STAINING INTENSITY Progesterone Receptor: 0%, NEGATIVE COMMENT: The negative hormone receptor study in this case has no internal positive control. REFERENCE RANGE ESTROGEN RECEPTOR NEGATIVE <1% POSITIVE =>1% PROGESTERONE RECEPTOR NEGATIVE <1% POSITIVE =>1% All controls stained appropriately Jimmy Picket MD Pathologist, Electronic Signature ( Signed 04/22/2013) CHROMOGENIC IN-SITU HYBRIDIZATION Results: HER-2/NEU BY CISH - NO AMPLIFICATION OF HER-2 DETECTED. RESULT RATIO OF HER2: CEP 17 SIGNALS 0.91 AVERAGE HER2 COPY NUMBER PER CELL 2.15 1 of 4 FINAL for Mary Young, Mary Young ([id number]) ADDITIONAL INFORMATION:(continued) REFERENCE RANGE NEGATIVE HER2/Chr17 Ratio <2.0 and Average HER2 copy number <4.0 EQUIVOCAL HER2/Chr17 Ratio <2.0 and Average HER2 copy number 4.0 and <6.0 POSITIVE HER2/Chr17 Ratio >=2.0 and/or Average HER2 copy number >=6.0 Jimmy Picket MD Pathologist, Electronic Signature ( Signed 04/22/2013) FINAL DIAGNOSIS Diagnosis Breast, modified radical mastectomy , Right and axillary contents - RESIDUAL INVASIVE DUCAL CARCINOMA, NOTTINGHAM COMBINED HISTOLOGIC GRADE II, 4.3 CM - INTERMEDIATE GRADE DUCTAL CARCINOMA IN SITU. - ANGIOLYMPHATIC INVASION PRESENT. - EXTENSIVE STROMAL FIBROSIS, CONSISTENT WITH  POST-TREATMENT EFFECT. - FIVE OF TWENTY-THREE LYMPH NODES, POSITIVE FOR METASTATIC CARCINOMA (5/23). - RESECTION MARGINS, NEGATIVE FOR ATYPIA OR MALIGNANCY. - PLEASE SEE ONCOLOGY TEMPLATE FOR DETAILS. Microscopic Comment BREAST, INVASIVE TUMOR, WITH LYMPH NODE SAMPLING Specimen, including laterality: Right breast and axillary lymph nodes Procedure: Modified radical mastectomy with lymph node dissection Grade: II Tubule formation: 2 Nuclear pleomorphism: 2 Mitotic: 1 Tumor size (gross measurement): 4.3 cm (clusters of tumor cells contiguously spread throughout the sections; dense stromal fibrosis in the background, consistent with post-treatment effect) Margins: Negative Invasive, distance to closest  margin: Deep margin: more than 1 cm In-situ, distance to closest margin: Deep margin: more then 1 cm Lymphovascular invasion: Present Ductal carcinoma in situ: Present Grade: Intermediate grade Extensive intraductal component: No Lobular neoplasia: N/A Tumor focality: Unifocal Treatment effect: Present, only in the breast tissue Extent of tumor: Skin: No Nipple: No Skeletal muscle: N/A Lymph nodes: # examined: 23 Lymph nodes with metastasis: 5 2 of 4 FINAL for Mary, Young ([id number]) Microscopic Comment(continued) Isolated tumor cells (< 0.2 mm): 0 Micrometastasis: (> 0.2 mm and < 2.0 mm): 0 Macrometastasis: (> 2.0 mm): 5 Extracapsular extension: No Breast prognostic profile: Please correlate with previous case [phone] Estrogen receptor: 100%, positive, strong staining intensity. Progesterone receptor: 96%, positive, strong staining intensity. Her 2 neu: No amplification, the ratio of Her 2:CEP 17 signals was 0.86 Ki-67: 66%; The breast prognostic profile will be repeated and an addendum report will follow. Non-neoplastic breast: Dense stromal fibrosis, consistent with post-treatment effect. TNM: ypT2, ypN2a Comments: A breast prognostic profile will be repeated  and an addendum report will follow. Sections show invasive ductal carcinoma with micropapillary lobular features. Immunohistochemical stain for E-Cadherin was performed an the tumor cells are strongly positive for E-Cadherin. The tumor is contiguously spreading throughout the sections with associated fibrotic stroma in the background; the morphologic features are mostly consistent with post-treatment effect. (HCL:kh 04-18-13) Abigail Miyamoto MD Pathologist, Electronic Signature (Case signed 04/20/2013) Specimen Gross and Clinical Information   RADIOGRAPHIC STUDIES:  No results found.  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 The tumor was ER positive PR negative HER-2/neu negative with an elevated Ki-67 of 66%. She received neoadjuvant antiestrogen therapy consisting of letrozole 2.5 mg. She had an Oncotype DX testing performed on her biopsy tissue that put her in the intermediate risk category.   #2 patient is now status post right modified radical mastectomy with axillary lymph node dissection. She does have significant residual disease measuring 4.3 cm which is ER positive PR negative HER-2/neu negative. 5 of 23 lymph nodes were positive for metastatic disease.   #3 patient with prior history of left breast cancer which was DCIS she underwent a lumpectomy followed by radiation.  #4 Patient was recommended chemotherapy consisting of CMF to be given every 3 weeks. We cannot use other agents do to her comorbidities and the use of steroids. Corticosteroid usage is contraindicated in patients do to her primary eye issues.  Currently CMF cycle 4 day 8.    PLAN:  #1  Doing well.  Patient's labs are stable following chemotherapy.  I reviewed them with her in detail.  #2 We discussed her nausea, along with her anti-emetic regimen.    #3 For the fatigue I counseled her on walking for short  periods of time twice per day.    #4 She will return on 08/16/13 for labs, an appointment with Dr. Welton Flakes, and cycle 5 of CMF.    All questions were answered. The patient knows to call the clinic with any problems, questions or concerns. We can certainly see the patient much sooner if necessary.  I spent 25 minutes counseling the patient face to face. The total time spent in the appointment was 30 minutes.  Cherie Ouch Lyn Hollingshead, NP Medical On[facility] Phone: [phone]

## 2013-08-16 ENCOUNTER — Other Ambulatory Visit (HOSPITAL_BASED_OUTPATIENT_CLINIC_OR_DEPARTMENT_OTHER): Payer: Medicare Other | Admitting: Lab

## 2013-08-16 ENCOUNTER — Ambulatory Visit (HOSPITAL_BASED_OUTPATIENT_CLINIC_OR_DEPARTMENT_OTHER): Payer: Medicare Other

## 2013-08-16 ENCOUNTER — Encounter: Payer: Self-pay | Admitting: Oncology

## 2013-08-16 ENCOUNTER — Telehealth: Payer: Self-pay | Admitting: Oncology

## 2013-08-16 ENCOUNTER — Telehealth: Payer: Self-pay | Admitting: *Deleted

## 2013-08-16 ENCOUNTER — Ambulatory Visit (HOSPITAL_BASED_OUTPATIENT_CLINIC_OR_DEPARTMENT_OTHER): Payer: Medicare Other | Admitting: Oncology

## 2013-08-16 VITALS — BP 135/77 | HR 73 | Temp 97.7°F | Resp 18 | Ht 63.0 in | Wt 135.0 lb

## 2013-08-16 DIAGNOSIS — C50419 Malignant neoplasm of upper-outer quadrant of unspecified female breast: Secondary | ICD-10-CM

## 2013-08-16 DIAGNOSIS — C50411 Malignant neoplasm of upper-outer quadrant of right female breast: Secondary | ICD-10-CM

## 2013-08-16 DIAGNOSIS — C50919 Malignant neoplasm of unspecified site of unspecified female breast: Secondary | ICD-10-CM

## 2013-08-16 DIAGNOSIS — Z170 Estrogen receptor positive status [ER+]: Secondary | ICD-10-CM

## 2013-08-16 DIAGNOSIS — C50911 Malignant neoplasm of unspecified site of right female breast: Secondary | ICD-10-CM

## 2013-08-16 DIAGNOSIS — Z5111 Encounter for antineoplastic chemotherapy: Secondary | ICD-10-CM

## 2013-08-16 LAB — COMPREHENSIVE METABOLIC PANEL (CC13)
ALT: 32 U/L (ref 0–55)
AST: 27 U/L (ref 5–34)
Albumin: 3.6 g/dL (ref 3.5–5.0)
Alkaline Phosphatase: 114 U/L (ref 40–150)
BUN: 15.4 mg/dL (ref 7.0–26.0)
CO2: 26 mEq/L (ref 22–29)
Calcium: 9.3 mg/dL (ref 8.4–10.4)
Chloride: 111 mEq/L — ABNORMAL HIGH (ref 98–109)
Creatinine: 0.6 mg/dL (ref 0.6–1.1)
Glucose: 87 mg/dl (ref 70–140)
Potassium: 4.1 mEq/L (ref 3.5–5.1)
Sodium: 144 mEq/L (ref 136–145)
Total Bilirubin: 0.43 mg/dL (ref 0.20–1.20)
Total Protein: 6.5 g/dL (ref 6.4–8.3)

## 2013-08-16 LAB — CBC WITH DIFFERENTIAL/PLATELET
BASO%: 2 % (ref 0.0–2.0)
Basophils Absolute: 0.1 10*3/uL (ref 0.0–0.1)
EOS%: 4.4 % (ref 0.0–7.0)
Eosinophils Absolute: 0.3 10*3/uL (ref 0.0–0.5)
HCT: 35.6 % (ref 34.8–46.6)
HGB: 12.2 g/dL (ref 11.6–15.9)
LYMPH%: 23.1 % (ref 14.0–49.7)
MCH: 32.8 pg (ref 25.1–34.0)
MCHC: 34.2 g/dL (ref 31.5–36.0)
MCV: 95.9 fL (ref 79.5–101.0)
MONO#: 0.4 10*3/uL (ref 0.1–0.9)
MONO%: 7.4 % (ref 0.0–14.0)
NEUT#: 3.7 10*3/uL (ref 1.5–6.5)
NEUT%: 63.1 % (ref 38.4–76.8)
Platelets: 203 10*3/uL (ref 145–400)
RBC: 3.71 10*6/uL (ref 3.70–5.45)
RDW: 13.6 % (ref 11.2–14.5)
WBC: 5.9 10*3/uL (ref 3.9–10.3)
lymph#: 1.4 10*3/uL (ref 0.9–3.3)

## 2013-08-16 MED ORDER — FLUOROURACIL CHEMO INJECTION 2.5 GM/50ML
600.0000 mg/m2 | Freq: Once | INTRAVENOUS | Status: AC
  Administered 2013-08-16: 1000 mg via INTRAVENOUS
  Filled 2013-08-16: qty 20

## 2013-08-16 MED ORDER — SODIUM CHLORIDE 0.9 % IJ SOLN
10.0000 mL | INTRAMUSCULAR | Status: DC | PRN
  Administered 2013-08-16: 10 mL
  Filled 2013-08-16: qty 10

## 2013-08-16 MED ORDER — METHOTREXATE SODIUM CHEMO INJECTION 25 MG/ML
40.0000 mg/m2 | Freq: Once | INTRAMUSCULAR | Status: AC
  Administered 2013-08-16: 65 mg via INTRAVENOUS
  Filled 2013-08-16: qty 2.6

## 2013-08-16 MED ORDER — SODIUM CHLORIDE 0.9 % IV SOLN
Freq: Once | INTRAVENOUS | Status: AC
  Administered 2013-08-16: 10:00:00 via INTRAVENOUS

## 2013-08-16 MED ORDER — SODIUM CHLORIDE 0.9 % IV SOLN
600.0000 mg/m2 | Freq: Once | INTRAVENOUS | Status: AC
  Administered 2013-08-16: 1000 mg via INTRAVENOUS
  Filled 2013-08-16: qty 50

## 2013-08-16 MED ORDER — ONDANSETRON 8 MG/NS 50 ML IVPB
INTRAVENOUS | Status: AC
  Filled 2013-08-16: qty 8

## 2013-08-16 MED ORDER — ONDANSETRON 8 MG/50ML IVPB (CHCC)
8.0000 mg | Freq: Once | INTRAVENOUS | Status: AC
  Administered 2013-08-16: 8 mg via INTRAVENOUS

## 2013-08-16 MED ORDER — HEPARIN SOD (PORK) LOCK FLUSH 100 UNIT/ML IV SOLN
500.0000 [IU] | Freq: Once | INTRAVENOUS | Status: AC | PRN
  Administered 2013-08-16: 500 [IU]
  Filled 2013-08-16: qty 5

## 2013-08-16 MED ORDER — INFLUENZA VAC SPLIT QUAD 0.5 ML IM SUSP
0.5000 mL | INTRAMUSCULAR | Status: DC
  Filled 2013-08-16: qty 0.5

## 2013-08-16 MED ORDER — INFLUENZA VAC SPLIT QUAD 0.5 ML IM SUSP
0.5000 mL | Freq: Once | INTRAMUSCULAR | Status: AC
  Administered 2013-08-17: 0.5 mL via INTRAMUSCULAR
  Filled 2013-08-16: qty 0.5

## 2013-08-16 NOTE — Addendum Note (Signed)
Addended by: Lorenza Evangelist A on: 08/16/2013 12:01 PM   Modules accepted: Orders

## 2013-08-16 NOTE — Telephone Encounter (Signed)
, °

## 2013-08-16 NOTE — Progress Notes (Signed)
OFFICE PROGRESS NOTE  CC Dr. Maurice Small Dr. Claud Kelp Dr. 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 Patient had a lymph node enlarged there was biopsied and was consistent with metastatic ductal carcinoma. Diagnosed October 2013.  PRIOR THERAPY:  #1 patient underwent a screening mammogram in September 2013 that showed a right breast mass with associated calcifications. On MRI the mass measured 4.7 x 3.6 x 5.2 cm lymph node measuring 1.5 x 1.0 x 2.0 cm. Patient had a needle core biopsy performed that showed invasive mammary carcinoma ER positive PR positive HER-2/neu negative with Ki-67 elevated at 66%. The lymph node was biopsied and was consistent with metastatic ductal carcinoma.  #2 patient was seen in the multidisciplinary breast clinic for discussion of treatment options. She had a Oncotype DX testing performed on the biopsied tissue her recurrence score was 28 giving her 18% risk of distant recurrence with antiestrogen therapy. She was in the intermediate risk category. Patient was enrolled on neoadjuvant antiestrogen treatment clinical trial.  #3 she is status post neoadjuvantletrozole 2.5 mg starting in November 2013 - June 2014.   #4 patient also has prior history of left lumpectomy and radiation for DCIS treated in 2003. This tumor was apparently ER and PR negative so she did not receive adjuvant tamoxifen.  #5 patient is now status post right modified radical mastectomy with axillary lymph node dissection. The final pathology revealed 4.3 cm invasive ductal carcinoma 5 lymph nodes were positive for metastatic disease. Tumor ER +100% PR negative Ki-67 66%. Pathologic staging T2 N2. Patient's case was discussed at the multidisciplinary breast conference. Adjuvant chemotherapy was recommended. I have spoken to the patient and I recommended either doing  Taxotere Cytoxan every 3 weeks. However due Tuesday road usage patient may not be a good candidate for this. We therefore discussed the possibility of doing CMF every 21 days for a total of 6 cycles. Once she completes the chemotherapy then she will proceed to postmastectomy radiation therapy.  #6 CMF every 21 days adjuvantly to begin 05/23/2013  CURRENT THERAPY: Cycle 5 day 1  INTERVAL HISTORY: Mary Young 72 y.o. female returns for followup visit today.  She is doing well today.  She was increasingly tired after this cycle of chemotherapy.  She did receive Neulasta after this cycle. Her appetite was decreased from Wednesday to Saturday following chemotherapy.  She is drinking 4 bottles of water a day in addition to other fluids.  She had mild nausea relieved by her anti-emetics.  She had one episode of constipation and took prunes for this and it resolved.  She denies fevers, chills, vomiting, diarrhea, numbness, skin changes, pain.     MEDICAL HISTORY: Past Medical History  Diagnosis Date  . Breast cancer     left lumpectomy  . Hypertension   . Glaucoma   . Central retinal vein occlusion   . Joint pain   . Dyslipidemia   . H/O echocardiogram 09/27/2009    Note to have some aortic valve sclerosis, mild mitral valve prolapse, mild mitral regurgitation, and mild asymptomatic LVH with an EF>55%  . History of stress test 09/27/2009    Normal Myocardial perfusion study. No significant ischemia demonstrated, this low risk scan. compared to the previous study.,there is no significant change, Normal myocardial Perfusion imaging. EF>83%  . Seasonal allergies     ALLERGIES:  is allergic to vicodin; percocet; precipitated  sulfur; and timolol.  MEDICATIONS:  Current Outpatient Prescriptions  Medication Sig Dispense Refill  . aspirin EC 81 MG tablet Take 81 mg by mouth daily.      . B Complex-C (SUPER B COMPLEX PO) Take 1 tablet by mouth daily.       . Calcium-Vitamin D-Vitamin K  500-100-40 MG-UNT-MCG CHEW Chew 1 tablet by mouth daily.       . Cinnamon 500 MG capsule Take 500 mg by mouth daily.      . dorzolamide (TRUSOPT) 2 % ophthalmic solution Place 1 drop into both eyes 2 (two) times daily.       . fish oil-omega-3 fatty acids 1000 MG capsule Take 2 g by mouth daily.      Marland Kitchen lidocaine-prilocaine (EMLA) cream Apply topically as needed.  30 g  0  . loratadine-pseudoephedrine (CLARITIN-D 24-HOUR) 10-240 MG per 24 hr tablet Take 1 tablet by mouth daily.      Marland Kitchen LORazepam (ATIVAN) 0.5 MG tablet Take 1 tablet (0.5 mg total) by mouth every 6 (six) hours as needed (Nausea or vomiting).  30 tablet  0  . losartan (COZAAR) 50 MG tablet Take 50 mg by mouth daily.      . Multiple Vitamin (MULTIVITAMIN WITH MINERALS) TABS Take 1 tablet by mouth daily.      . prochlorperazine (COMPAZINE) 10 MG tablet Take 1 tablet (10 mg total) by mouth every 6 (six) hours as needed (Nausea or vomiting).  30 tablet  1  . prochlorperazine (COMPAZINE) 25 MG suppository Place 1 suppository (25 mg total) rectally every 12 (twelve) hours as needed for nausea.  12 suppository  3  . promethazine (PHENERGAN) 25 MG tablet Take 1 tablet (25 mg total) by mouth every 4 (four) hours as needed for nausea.  10 tablet  0  . simvastatin (ZOCOR) 20 MG tablet       . timolol (TIMOPTIC) 0.5 % ophthalmic solution Place 1 drop into both eyes 2 (two) times daily. Non preservative      . TRAVATAN Z 0.004 % SOLN ophthalmic solution Place 1 drop into both eyes at bedtime.       . vitamin E 400 UNIT capsule Take 400 Units by mouth daily.       No current facility-administered medications for this visit.   Facility-Administered Medications Ordered in Other Visits  Medication Dose Route Frequency Provider Last Rate Last Dose  . 0.9 %  sodium chloride infusion   Intravenous Once Augustin Schooling, NP        SURGICAL HISTORY:  Past Surgical History  Procedure Laterality Date  . Abdominal hysterectomy  1991  . Breast  lumpectomy  2003  . Arthroscopy knee w/ drilling  2005/2008    Left knee/Right knee  . Tubal ligation  1983  . Mastectomy modified radical Right 04/15/2013    Procedure: MASTECTOMY MODIFIED RADICAL;  Surgeon: Ernestene Mention, MD;  Location: [facility] OR;  Service: General;  Laterality: Right;  . Portacath placement Left 05/13/2013    Procedure: INSERTION PORT-A-CATH;  Surgeon: Ernestene Mention, MD;  Location: [facility] OR;  Service: General;  Laterality: Left;    REVIEW OF SYSTEMS:  A 10 point review of systems was conducted and is otherwise negative except for what is noted above.      PHYSICAL EXAMINATION: Blood pressure 135/77, pulse 73, temperature 97.7 F (36.5 C), temperature source Oral, resp. rate 18, height 5\' 3"  (1.6 m), weight 135 lb (61.236 kg). Body mass index is 23.92 kg/(m^2).  General: Patient is a well appearing female in no acute distress HEENT: PERRLA, sclerae anicteric no conjunctival pallor, MMM Neck: supple, no palpable adenopathy Lungs: clear to auscultation bilaterally, no wheezes, rhonchi, or rales Cardiovascular: regular rate rhythm, S1, S2, no murmurs, rubs or gallops Abdomen: Soft, non-tender, non-distended, normoactive bowel sounds, no HSM Extremities: warm and well perfused, no clubbing, cyanosis, or edema Skin: No rashes or lesions Neuro: Non-focal Left breast: Well-healed surgical scar in the inner lower quadrant.  No nodules, or masses, right underarm with excess skin, no seroma identified.   Right mastectomy well healed, no nodularity.   ECOG PERFORMANCE STATUS: 0 - Asymptomatic      LABORATORY DATA: Lab Results  Component Value Date   WBC 9.3 08/02/2013   HGB 12.0 08/02/2013   HCT 35.1 08/02/2013   MCV 93.6 08/02/2013   PLT 134* 08/02/2013      Chemistry      Component Value Date/Time   NA 144 08/02/2013 0830   NA 143 05/11/2013 0940   K 4.1 08/02/2013 0830   K 3.8 05/11/2013 0940   CL 106 05/11/2013 0940   CL 107 03/22/2013 0811   CO2 30* 08/02/2013 0830    CO2 27 05/11/2013 0940   BUN 14.3 08/02/2013 0830   BUN 14 05/11/2013 0940   CREATININE 0.7 08/02/2013 0830   CREATININE 0.60 05/11/2013 0940      Component Value Date/Time   CALCIUM 9.7 08/02/2013 0830   CALCIUM 9.2 05/11/2013 0940   ALKPHOS 138 08/02/2013 0830   ALKPHOS 69 04/08/2013 0942   AST 16 08/02/2013 0830   AST 22 04/08/2013 0942   ALT 16 08/02/2013 0830   ALT 20 04/08/2013 0942   BILITOT 0.50 08/02/2013 0830   BILITOT 0.6 04/08/2013 0942     ADDITIONAL INFORMATION: PROGNOSTIC INDICATORS - ACIS Results: IMMUNOHISTOCHEMICAL AND MORPHOMETRIC ANALYSIS BY THE AUTOMATED CELLULAR IMAGING SYSTEM (ACIS) Estrogen Receptor: 100%, POSITIVE, STRONG STAINING INTENSITY Progesterone Receptor: 0%, NEGATIVE COMMENT: The negative hormone receptor study in this case has no internal positive control. REFERENCE RANGE ESTROGEN RECEPTOR NEGATIVE <1% POSITIVE =>1% PROGESTERONE RECEPTOR NEGATIVE <1% POSITIVE =>1% All controls stained appropriately Jimmy Picket MD Pathologist, Electronic Signature ( Signed 04/22/2013) CHROMOGENIC IN-SITU HYBRIDIZATION Results: HER-2/NEU BY CISH - NO AMPLIFICATION OF HER-2 DETECTED. RESULT RATIO OF HER2: CEP 17 SIGNALS 0.91 AVERAGE HER2 COPY NUMBER PER CELL 2.15 1 of 4 FINAL for Mary Young, Mary Young ([id number]) ADDITIONAL INFORMATION:(continued) REFERENCE RANGE NEGATIVE HER2/Chr17 Ratio <2.0 and Average HER2 copy number <4.0 EQUIVOCAL HER2/Chr17 Ratio <2.0 and Average HER2 copy number 4.0 and <6.0 POSITIVE HER2/Chr17 Ratio >=2.0 and/or Average HER2 copy number >=6.0 Jimmy Picket MD Pathologist, Electronic Signature ( Signed 04/22/2013) FINAL DIAGNOSIS Diagnosis Breast, modified radical mastectomy , Right and axillary contents - RESIDUAL INVASIVE DUCAL CARCINOMA, NOTTINGHAM COMBINED HISTOLOGIC GRADE II, 4.3 CM - INTERMEDIATE GRADE DUCTAL CARCINOMA IN SITU. - ANGIOLYMPHATIC INVASION PRESENT. - EXTENSIVE STROMAL FIBROSIS, CONSISTENT WITH POST-TREATMENT  EFFECT. - FIVE OF TWENTY-THREE LYMPH NODES, POSITIVE FOR METASTATIC CARCINOMA (5/23). - RESECTION MARGINS, NEGATIVE FOR ATYPIA OR MALIGNANCY. - PLEASE SEE ONCOLOGY TEMPLATE FOR DETAILS. Microscopic Comment BREAST, INVASIVE TUMOR, WITH LYMPH NODE SAMPLING Specimen, including laterality: Right breast and axillary lymph nodes Procedure: Modified radical mastectomy with lymph node dissection Grade: II Tubule formation: 2 Nuclear pleomorphism: 2 Mitotic: 1 Tumor size (gross measurement): 4.3 cm (clusters of tumor cells contiguously spread throughout the sections; dense stromal fibrosis in the background, consistent with post-treatment effect) Margins: Negative Invasive, distance to closest margin: Deep margin: more  than 1 cm In-situ, distance to closest margin: Deep margin: more then 1 cm Lymphovascular invasion: Present Ductal carcinoma in situ: Present Grade: Intermediate grade Extensive intraductal component: No Lobular neoplasia: N/A Tumor focality: Unifocal Treatment effect: Present, only in the breast tissue Extent of tumor: Skin: No Nipple: No Skeletal muscle: N/A Lymph nodes: # examined: 23 Lymph nodes with metastasis: 5 2 of 4 FINAL for Mary Young, Mary Young ([id number]) Microscopic Comment(continued) Isolated tumor cells (< 0.2 mm): 0 Micrometastasis: (> 0.2 mm and < 2.0 mm): 0 Macrometastasis: (> 2.0 mm): 5 Extracapsular extension: No Breast prognostic profile: Please correlate with previous case [phone] Estrogen receptor: 100%, positive, strong staining intensity. Progesterone receptor: 96%, positive, strong staining intensity. Her 2 neu: No amplification, the ratio of Her 2:CEP 17 signals was 0.86 Ki-67: 66%; The breast prognostic profile will be repeated and an addendum report will follow. Non-neoplastic breast: Dense stromal fibrosis, consistent with post-treatment effect. TNM: ypT2, ypN2a Comments: A breast prognostic profile will be repeated and an addendum  report will follow. Sections show invasive ductal carcinoma with micropapillary lobular features. Immunohistochemical stain for E-Cadherin was performed an the tumor cells are strongly positive for E-Cadherin. The tumor is contiguously spreading throughout the sections with associated fibrotic stroma in the background; the morphologic features are mostly consistent with post-treatment effect. (HCL:kh 04-18-13) Abigail Miyamoto MD Pathologist, Electronic Signature (Case signed 04/20/2013) Specimen Gross and Clinical Information   RADIOGRAPHIC STUDIES:  No results found.  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 The tumor was ER positive PR negative HER-2/neu negative with an elevated Ki-67 of 66%. She received neoadjuvant antiestrogen therapy consisting of letrozole 2.5 mg. She had an Oncotype DX testing performed on her biopsy tissue that put her in the intermediate risk category.   #2 patient is now status post right modified radical mastectomy with axillary lymph node dissection. She does have significant residual disease measuring 4.3 cm which is ER positive PR negative HER-2/neu negative. 5 of 23 lymph nodes were positive for metastatic disease.   #3 patient with prior history of left breast cancer which was DCIS she underwent a lumpectomy followed by radiation.  #4 Patient was recommended chemotherapy consisting of CMF to be given every 3 weeks. We cannot use other agents do to her comorbidities and the use of steroids. Corticosteroid usage is contraindicated in patients do to her primary eye issues.  Currently CMF cycle 4 day 8.    PLAN:  #1 overall patient is doing well tolerating CMF very nicely.  #2 she will proceed with cycle #5 of CMF. She will return tomorrow for Neulasta injection  #3 I was seeing her back in one week's time for followup.  #4 she will also receive flu  injection today.  Drue Second, MD Medical/Oncology [facility] [phone] (beeper) [phone] (Office)  08/16/2013, 9:17 AM

## 2013-08-16 NOTE — Patient Instructions (Addendum)
Proceed with cycle #5 of CMF.  We will see you back in one week's time for followup and lab.  We will also give you a flu shot today.

## 2013-08-16 NOTE — Telephone Encounter (Signed)
Per staff message and POF I have scheduled appts.  JMW  

## 2013-08-16 NOTE — Patient Instructions (Signed)
[facility] Discharge Instructions for Patients Receiving Chemotherapy  Today you received the following chemotherapy agents Methotrexate/Cytoxan/5 FU To help prevent nausea and vomiting after your treatment, we encourage you to take your nausea medication as prescribed.If you develop nausea and vomiting that is not controlled by your nausea medication, call the clinic.   BELOW ARE SYMPTOMS THAT SHOULD BE REPORTED IMMEDIATELY:  *FEVER GREATER THAN 100.5 F  *CHILLS WITH OR WITHOUT FEVER  NAUSEA AND VOMITING THAT IS NOT CONTROLLED WITH YOUR NAUSEA MEDICATION  *UNUSUAL SHORTNESS OF BREATH  *UNUSUAL BRUISING OR BLEEDING  TENDERNESS IN MOUTH AND THROAT WITH OR WITHOUT PRESENCE OF ULCERS  *URINARY PROBLEMS  *BOWEL PROBLEMS  UNUSUAL RASH Items with * indicate a potential emergency and should be followed up as soon as possible.  Feel free to call the clinic you have any questions or concerns. The clinic phone number is [phone].

## 2013-08-17 ENCOUNTER — Ambulatory Visit (HOSPITAL_BASED_OUTPATIENT_CLINIC_OR_DEPARTMENT_OTHER): Payer: Medicare Other

## 2013-08-17 VITALS — BP 136/70 | HR 60 | Temp 98.3°F

## 2013-08-17 DIAGNOSIS — C50419 Malignant neoplasm of upper-outer quadrant of unspecified female breast: Secondary | ICD-10-CM

## 2013-08-17 DIAGNOSIS — C773 Secondary and unspecified malignant neoplasm of axilla and upper limb lymph nodes: Secondary | ICD-10-CM

## 2013-08-17 DIAGNOSIS — C50411 Malignant neoplasm of upper-outer quadrant of right female breast: Secondary | ICD-10-CM

## 2013-08-17 DIAGNOSIS — Z23 Encounter for immunization: Secondary | ICD-10-CM

## 2013-08-17 DIAGNOSIS — C50911 Malignant neoplasm of unspecified site of right female breast: Secondary | ICD-10-CM

## 2013-08-17 MED ORDER — PEGFILGRASTIM INJECTION 6 MG/0.6ML
6.0000 mg | Freq: Once | SUBCUTANEOUS | Status: AC
  Administered 2013-08-17: 6 mg via SUBCUTANEOUS
  Filled 2013-08-17: qty 0.6

## 2013-08-23 ENCOUNTER — Other Ambulatory Visit: Payer: Medicare Other | Admitting: Lab

## 2013-08-23 ENCOUNTER — Ambulatory Visit: Payer: Medicare Other | Admitting: Adult Health

## 2013-08-25 ENCOUNTER — Ambulatory Visit (HOSPITAL_BASED_OUTPATIENT_CLINIC_OR_DEPARTMENT_OTHER): Payer: Medicare Other | Admitting: Adult Health

## 2013-08-25 ENCOUNTER — Other Ambulatory Visit (HOSPITAL_BASED_OUTPATIENT_CLINIC_OR_DEPARTMENT_OTHER): Payer: Medicare Other | Admitting: Lab

## 2013-08-25 ENCOUNTER — Telehealth: Payer: Self-pay | Admitting: Oncology

## 2013-08-25 ENCOUNTER — Ambulatory Visit (HOSPITAL_BASED_OUTPATIENT_CLINIC_OR_DEPARTMENT_OTHER): Payer: Medicare Other

## 2013-08-25 ENCOUNTER — Encounter: Payer: Self-pay | Admitting: Adult Health

## 2013-08-25 VITALS — BP 124/74 | HR 67 | Temp 98.0°F | Resp 18

## 2013-08-25 VITALS — BP 125/71 | HR 62 | Temp 97.8°F | Resp 20 | Ht 63.0 in | Wt 133.8 lb

## 2013-08-25 DIAGNOSIS — C50919 Malignant neoplasm of unspecified site of unspecified female breast: Secondary | ICD-10-CM

## 2013-08-25 DIAGNOSIS — C773 Secondary and unspecified malignant neoplasm of axilla and upper limb lymph nodes: Secondary | ICD-10-CM

## 2013-08-25 DIAGNOSIS — C50411 Malignant neoplasm of upper-outer quadrant of right female breast: Secondary | ICD-10-CM

## 2013-08-25 DIAGNOSIS — C50911 Malignant neoplasm of unspecified site of right female breast: Secondary | ICD-10-CM

## 2013-08-25 DIAGNOSIS — C50419 Malignant neoplasm of upper-outer quadrant of unspecified female breast: Secondary | ICD-10-CM

## 2013-08-25 DIAGNOSIS — I10 Essential (primary) hypertension: Secondary | ICD-10-CM

## 2013-08-25 LAB — COMPREHENSIVE METABOLIC PANEL (CC13)
ALT: 29 U/L (ref 0–55)
AST: 27 U/L (ref 5–34)
Albumin: 3.5 g/dL (ref 3.5–5.0)
Alkaline Phosphatase: 141 U/L (ref 40–150)
Anion Gap: 6 mEq/L (ref 3–11)
BUN: 11.7 mg/dL (ref 7.0–26.0)
CO2: 27 mEq/L (ref 22–29)
Calcium: 9.3 mg/dL (ref 8.4–10.4)
Chloride: 110 mEq/L — ABNORMAL HIGH (ref 98–109)
Creatinine: 0.7 mg/dL (ref 0.6–1.1)
Glucose: 102 mg/dl (ref 70–140)
Potassium: 4 mEq/L (ref 3.5–5.1)
Sodium: 143 mEq/L (ref 136–145)
Total Bilirubin: 0.33 mg/dL (ref 0.20–1.20)
Total Protein: 6.3 g/dL — ABNORMAL LOW (ref 6.4–8.3)

## 2013-08-25 LAB — CBC WITH DIFFERENTIAL/PLATELET
BASO%: 0.7 % (ref 0.0–2.0)
Basophils Absolute: 0.1 10*3/uL (ref 0.0–0.1)
EOS%: 7.3 % — ABNORMAL HIGH (ref 0.0–7.0)
Eosinophils Absolute: 0.6 10*3/uL — ABNORMAL HIGH (ref 0.0–0.5)
HCT: 32.9 % — ABNORMAL LOW (ref 34.8–46.6)
HGB: 11.1 g/dL — ABNORMAL LOW (ref 11.6–15.9)
LYMPH%: 17.8 % (ref 14.0–49.7)
MCH: 32.5 pg (ref 25.1–34.0)
MCHC: 33.7 g/dL (ref 31.5–36.0)
MCV: 96.2 fL (ref 79.5–101.0)
MONO#: 0.9 10*3/uL (ref 0.1–0.9)
MONO%: 10.4 % (ref 0.0–14.0)
NEUT#: 5.3 10*3/uL (ref 1.5–6.5)
NEUT%: 63.8 % (ref 38.4–76.8)
Platelets: 185 10*3/uL (ref 145–400)
RBC: 3.42 10*6/uL — ABNORMAL LOW (ref 3.70–5.45)
RDW: 13.2 % (ref 11.2–14.5)
WBC: 8.3 10*3/uL (ref 3.9–10.3)
lymph#: 1.5 10*3/uL (ref 0.9–3.3)
nRBC: 0 % (ref 0–0)

## 2013-08-25 MED ORDER — SODIUM CHLORIDE 0.9 % IJ SOLN
10.0000 mL | INTRAMUSCULAR | Status: DC | PRN
  Administered 2013-08-25: 10 mL via INTRAVENOUS
  Filled 2013-08-25: qty 10

## 2013-08-25 MED ORDER — HEPARIN SOD (PORK) LOCK FLUSH 100 UNIT/ML IV SOLN
500.0000 [IU] | Freq: Once | INTRAVENOUS | Status: AC
  Administered 2013-08-25: 500 [IU] via INTRAVENOUS
  Filled 2013-08-25: qty 5

## 2013-08-25 NOTE — Telephone Encounter (Signed)
, °

## 2013-08-25 NOTE — Patient Instructions (Signed)
Doing well.  Labs are stable.  I will refer you to radiation oncology.  We will see you back in 2 weeks for your next chemotherapy.  Please call us if you have any questions or concerns.

## 2013-08-25 NOTE — Progress Notes (Signed)
OFFICE PROGRESS NOTE  CC Dr. Maurice Small Dr. Claud Kelp Dr. 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 Patient had a lymph node enlarged there was biopsied and was consistent with metastatic ductal carcinoma. Diagnosed October 2013.  PRIOR THERAPY:  #1 patient underwent a screening mammogram in September 2013 that showed a right breast mass with associated calcifications. On MRI the mass measured 4.7 x 3.6 x 5.2 cm lymph node measuring 1.5 x 1.0 x 2.0 cm. Patient had a needle core biopsy performed that showed invasive mammary carcinoma ER positive PR positive HER-2/neu negative with Ki-67 elevated at 66%. The lymph node was biopsied and was consistent with metastatic ductal carcinoma.  #2 patient was seen in the multidisciplinary breast clinic for discussion of treatment options. She had a Oncotype DX testing performed on the biopsied tissue her recurrence score was 28 giving her 18% risk of distant recurrence with antiestrogen therapy. She was in the intermediate risk category. Patient was enrolled on neoadjuvant antiestrogen treatment clinical trial.  #3 she is status post neoadjuvant letrozole 2.5 mg starting in November 2013 - June 2014.   #4 patient also has prior history of left lumpectomy and radiation for DCIS treated in 2003. This tumor was apparently ER and PR negative so she did not receive adjuvant tamoxifen.  #5 patient is now status post right modified radical mastectomy with axillary lymph node dissection. The final pathology revealed 4.3 cm invasive ductal carcinoma 5 lymph nodes were positive for metastatic disease. Tumor ER +100% PR negative Ki-67 66%. Pathologic staging T2 N2. Patient's case was discussed at the multidisciplinary breast conference. Adjuvant chemotherapy was recommended either doing Taxotere Cytoxan every 3 weeks. However  Dexamtheasone usage, patient may not be a good candidate for this. We therefore discussed the possibility of doing CMF every 21 days for a total of 6 cycles. Once she completes the chemotherapy then she will proceed to postmastectomy radiation therapy.  #6 CMF every 21 days adjuvantly started 05/23/2013  CURRENT THERAPY: Cycle 5 day 8  INTERVAL HISTORY: Mary Young 72 y.o. female returns for followup visit today.  She's doing well.  She received her fifth cycle of chemotherapy last week.  She did develop bone pain following Neulasta, but this was relieved with Tylenol.  She denies, fevers, chills, nausea, vomiting, numbness.  She continues eat prunes for constipation if it occurs.  Otherwise, a 10 point ROS is negative.    MEDICAL HISTORY: Past Medical History  Diagnosis Date  . Breast cancer     left lumpectomy  . Hypertension   . Glaucoma   . Central retinal vein occlusion   . Joint pain   . Dyslipidemia   . H/O echocardiogram 09/27/2009    Note to have some aortic valve sclerosis, mild mitral valve prolapse, mild mitral regurgitation, and mild asymptomatic LVH with an EF>55%  . History of stress test 09/27/2009    Normal Myocardial perfusion study. No significant ischemia demonstrated, this low risk scan. compared to the previous study.,there is no significant change, Normal myocardial Perfusion imaging. EF>83%  . Seasonal allergies     ALLERGIES:  is allergic to vicodin; percocet; precipitated sulfur; and timolol.  MEDICATIONS:  Current Outpatient Prescriptions  Medication Sig Dispense Refill  . aspirin EC 81 MG tablet Take 81 mg by mouth daily.      . B Complex-C (SUPER B COMPLEX PO) Take 1 tablet  by mouth daily.       . Calcium-Vitamin D-Vitamin K 500-100-40 MG-UNT-MCG CHEW Chew 1 tablet by mouth daily.       . Chlorpheniramine Maleate (CHLOR-TRIMETON PO) Take 1 tablet by mouth as needed.      . Cinnamon 500 MG capsule Take 500 mg by mouth daily.      . dorzolamide  (TRUSOPT) 2 % ophthalmic solution Place 1 drop into both eyes 2 (two) times daily.       . fish oil-omega-3 fatty acids 1000 MG capsule Take 2 g by mouth daily.      Marland Kitchen lidocaine-prilocaine (EMLA) cream Apply topically as needed.  30 g  0  . losartan (COZAAR) 50 MG tablet Take 50 mg by mouth daily.      . Multiple Vitamin (MULTIVITAMIN WITH MINERALS) TABS Take 1 tablet by mouth daily.      . prochlorperazine (COMPAZINE) 10 MG tablet Take 1 tablet (10 mg total) by mouth every 6 (six) hours as needed (Nausea or vomiting).  30 tablet  1  . simvastatin (ZOCOR) 20 MG tablet       . timolol (TIMOPTIC) 0.5 % ophthalmic solution Place 1 drop into both eyes 3 (three) times daily. Non preservative      . TRAVATAN Z 0.004 % SOLN ophthalmic solution Place 1 drop into both eyes at bedtime.       . vitamin E 400 UNIT capsule Take 400 Units by mouth daily.       No current facility-administered medications for this visit.   Facility-Administered Medications Ordered in Other Visits  Medication Dose Route Frequency Provider Last Rate Last Dose  . 0.9 %  sodium chloride infusion   Intravenous Once Augustin Schooling, NP        SURGICAL HISTORY:  Past Surgical History  Procedure Laterality Date  . Abdominal hysterectomy  1991  . Breast lumpectomy  2003  . Arthroscopy knee w/ drilling  2005/2008    Left knee/Right knee  . Tubal ligation  1983  . Mastectomy modified radical Right 04/15/2013    Procedure: MASTECTOMY MODIFIED RADICAL;  Surgeon: Ernestene Mention, MD;  Location: [facility] OR;  Service: General;  Laterality: Right;  . Portacath placement Left 05/13/2013    Procedure: INSERTION PORT-A-CATH;  Surgeon: Ernestene Mention, MD;  Location: [facility] OR;  Service: General;  Laterality: Left;    REVIEW OF SYSTEMS:  A 10 point review of systems was conducted and is otherwise negative except for what is noted above.      PHYSICAL EXAMINATION: Blood pressure 125/71, pulse 62, temperature 97.8 F (36.6 C), temperature  source Oral, resp. rate 20, height 5\' 3"  (1.6 m), weight 133 lb 12.8 oz (60.691 kg). Body mass index is 23.71 kg/(m^2). General: Patient is a well appearing female in no acute distress HEENT: PERRLA, sclerae anicteric no conjunctival pallor, MMM Neck: supple, no palpable adenopathy Lungs: clear to auscultation bilaterally, no wheezes, rhonchi, or rales Cardiovascular: regular rate rhythm, S1, S2, no murmurs, rubs or gallops Abdomen: Soft, non-tender, non-distended, normoactive bowel sounds, no HSM Extremities: warm and well perfused, no clubbing, cyanosis, or edema Skin: No rashes or lesions Neuro: Non-focal Left breast:  No nodules, or masses, right underarm with excess skin, no seroma identified.   Right mastectomy well healed, no nodularity.   ECOG PERFORMANCE STATUS: 0 - Asymptomatic      LABORATORY DATA: Lab Results  Component Value Date   WBC 8.3 08/25/2013   HGB 11.1* 08/25/2013   HCT  32.9* 08/25/2013   MCV 96.2 08/25/2013   PLT 185 08/25/2013      Chemistry      Component Value Date/Time   NA 143 08/25/2013 1027   NA 143 05/11/2013 0940   K 4.0 08/25/2013 1027   K 3.8 05/11/2013 0940   CL 106 05/11/2013 0940   CL 107 03/22/2013 0811   CO2 27 08/25/2013 1027   CO2 27 05/11/2013 0940   BUN 11.7 08/25/2013 1027   BUN 14 05/11/2013 0940   CREATININE 0.7 08/25/2013 1027   CREATININE 0.60 05/11/2013 0940      Component Value Date/Time   CALCIUM 9.3 08/25/2013 1027   CALCIUM 9.2 05/11/2013 0940   ALKPHOS 141 08/25/2013 1027   ALKPHOS 69 04/08/2013 0942   AST 27 08/25/2013 1027   AST 22 04/08/2013 0942   ALT 29 08/25/2013 1027   ALT 20 04/08/2013 0942   BILITOT 0.33 08/25/2013 1027   BILITOT 0.6 04/08/2013 0942     ADDITIONAL INFORMATION: PROGNOSTIC INDICATORS - ACIS Results: IMMUNOHISTOCHEMICAL AND MORPHOMETRIC ANALYSIS BY THE AUTOMATED CELLULAR IMAGING SYSTEM (ACIS) Estrogen Receptor: 100%, POSITIVE, STRONG STAINING INTENSITY Progesterone Receptor: 0%, NEGATIVE COMMENT: The  negative hormone receptor study in this case has no internal positive control. REFERENCE RANGE ESTROGEN RECEPTOR NEGATIVE <1% POSITIVE =>1% PROGESTERONE RECEPTOR NEGATIVE <1% POSITIVE =>1% All controls stained appropriately Jimmy Picket MD Pathologist, Electronic Signature ( Signed 04/22/2013) CHROMOGENIC IN-SITU HYBRIDIZATION Results: HER-2/NEU BY CISH - NO AMPLIFICATION OF HER-2 DETECTED. RESULT RATIO OF HER2: CEP 17 SIGNALS 0.91 AVERAGE HER2 COPY NUMBER PER CELL 2.15 1 of 4 FINAL for ZALIYAH, MEIKLE ([id number]) ADDITIONAL INFORMATION:(continued) REFERENCE RANGE NEGATIVE HER2/Chr17 Ratio <2.0 and Average HER2 copy number <4.0 EQUIVOCAL HER2/Chr17 Ratio <2.0 and Average HER2 copy number 4.0 and <6.0 POSITIVE HER2/Chr17 Ratio >=2.0 and/or Average HER2 copy number >=6.0 Jimmy Picket MD Pathologist, Electronic Signature ( Signed 04/22/2013) FINAL DIAGNOSIS Diagnosis Breast, modified radical mastectomy , Right and axillary contents - RESIDUAL INVASIVE DUCAL CARCINOMA, NOTTINGHAM COMBINED HISTOLOGIC GRADE II, 4.3 CM - INTERMEDIATE GRADE DUCTAL CARCINOMA IN SITU. - ANGIOLYMPHATIC INVASION PRESENT. - EXTENSIVE STROMAL FIBROSIS, CONSISTENT WITH POST-TREATMENT EFFECT. - FIVE OF TWENTY-THREE LYMPH NODES, POSITIVE FOR METASTATIC CARCINOMA (5/23). - RESECTION MARGINS, NEGATIVE FOR ATYPIA OR MALIGNANCY. - PLEASE SEE ONCOLOGY TEMPLATE FOR DETAILS. Microscopic Comment BREAST, INVASIVE TUMOR, WITH LYMPH NODE SAMPLING Specimen, including laterality: Right breast and axillary lymph nodes Procedure: Modified radical mastectomy with lymph node dissection Grade: II Tubule formation: 2 Nuclear pleomorphism: 2 Mitotic: 1 Tumor size (gross measurement): 4.3 cm (clusters of tumor cells contiguously spread throughout the sections; dense stromal fibrosis in the background, consistent with post-treatment effect) Margins: Negative Invasive, distance to closest margin: Deep margin: more than  1 cm In-situ, distance to closest margin: Deep margin: more then 1 cm Lymphovascular invasion: Present Ductal carcinoma in situ: Present Grade: Intermediate grade Extensive intraductal component: No Lobular neoplasia: N/A Tumor focality: Unifocal Treatment effect: Present, only in the breast tissue Extent of tumor: Skin: No Nipple: No Skeletal muscle: N/A Lymph nodes: # examined: 23 Lymph nodes with metastasis: 5 2 of 4 FINAL for NICKOLE, ADAMEK ([id number]) Microscopic Comment(continued) Isolated tumor cells (< 0.2 mm): 0 Micrometastasis: (> 0.2 mm and < 2.0 mm): 0 Macrometastasis: (> 2.0 mm): 5 Extracapsular extension: No Breast prognostic profile: Please correlate with previous case [phone] Estrogen receptor: 100%, positive, strong staining intensity. Progesterone receptor: 96%, positive, strong staining intensity. Her 2 neu: No amplification, the ratio of Her 2:CEP 17 signals was 0.86 Ki-67:  66%; The breast prognostic profile will be repeated and an addendum report will follow. Non-neoplastic breast: Dense stromal fibrosis, consistent with post-treatment effect. TNM: ypT2, ypN2a Comments: A breast prognostic profile will be repeated and an addendum report will follow. Sections show invasive ductal carcinoma with micropapillary lobular features. Immunohistochemical stain for E-Cadherin was performed an the tumor cells are strongly positive for E-Cadherin. The tumor is contiguously spreading throughout the sections with associated fibrotic stroma in the background; the morphologic features are mostly consistent with post-treatment effect. (HCL:kh 04-18-13) Abigail Miyamoto MD Pathologist, Electronic Signature (Case signed 04/20/2013) Specimen Gross and Clinical Information   RADIOGRAPHIC STUDIES:  No results found.  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 The  tumor was ER positive PR negative HER-2/neu negative with an elevated Ki-67 of 66%. She received neoadjuvant antiestrogen therapy consisting of letrozole 2.5 mg. She had an Oncotype DX testing performed on her biopsy tissue that put her in the intermediate risk category.   #2 patient is now status post right modified radical mastectomy with axillary lymph node dissection. She does have significant residual disease measuring 4.3 cm which is ER positive PR negative HER-2/neu negative. 5 of 23 lymph nodes were positive for metastatic disease.   #3 patient with prior history of left breast cancer which was DCIS she underwent a lumpectomy followed by radiation.  #4 Patient was recommended chemotherapy consisting of CMF to be given every 3 weeks. We cannot use other agents do to her comorbidities and the use of steroids. Corticosteroid usage is contraindicated in patient due to her primary eye issues.  Currently CMF cycle 5 day 8.    PLAN:   #1  Doing well.  Labs are stable.  I reviewed them with her in detail.    #2 I referred the patient back to Dr. Michell Heinrich to discuss radiaition therapy and plans.    #3 She will return in 2 weeks for cycle 6 of CMF.    Cherie Ouch Lyn Hollingshead, NP Medical Oncology [facility] Phone: [phone] 08/26/2013, 12:41 PM

## 2013-09-02 ENCOUNTER — Encounter: Payer: Self-pay | Admitting: Radiation Oncology

## 2013-09-02 NOTE — Progress Notes (Signed)
Location of Breast Cancer:Right  Histology per Pathology Report 04/15/2013 Diagnosis Breast, modified radical mastectomy , Right and axillary contents - RESIDUAL INVASIVE DUCAL CARCINOMA, NOTTINGHAM COMBINED HISTOLOGIC GRADE II, 4.3 CM - INTERMEDIATE GRADE DUCTAL CARCINOMA IN SITU. - ANGIOLYMPHATIC INVASION PRESENT. - EXTENSIVE STROMAL FIBROSIS, CONSISTENT WITH POST-TREATMENT EFFECT. - FIVE OF TWENTY-THREE LYMPH NODES, POSITIVE FOR METASTATIC CARCINOMA (5/23). - RESECTION MARGINS, NEGATIVE FOR ATYPIA OR MALIGNANCY. - PLEASE SEE ONCOLOGY TEMPLATE    Receptor Status: ER(+), PR (+), Her2-neu (-)  Did patient present with symptoms:mass found on screening mammogram on September 2013.  Past/Anticipated interventions by surgeon, if any:{Right modified radical mastectomy with axillary ln dissection. History of left lumpectomy for DCIS in 2003. Recurrent oncotype score 28  Past/Anticipated interventions by medical oncology, if any: Chemotherapy:neoadjuvant  Letrozole November 2013 thru June 2014. CMF every 3 weeks as of 05/23/13. Last chemo on 09/05/13.  Lymphedema issues, if any:No  Pain issues, if any:No  SAFETY ISSUES:  Prior radiation? {yes 2003  Pacemaker/ICD? No  Possible current pregnancy?No, had abdominal hysterectomy  Is the patient on methotrexate? 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    Tessa Lerner, RN 09/02/2013,11:34 AM

## 2013-09-06 ENCOUNTER — Other Ambulatory Visit (HOSPITAL_BASED_OUTPATIENT_CLINIC_OR_DEPARTMENT_OTHER): Payer: Medicare Other

## 2013-09-06 ENCOUNTER — Telehealth: Payer: Self-pay | Admitting: Oncology

## 2013-09-06 ENCOUNTER — Ambulatory Visit (HOSPITAL_BASED_OUTPATIENT_CLINIC_OR_DEPARTMENT_OTHER): Payer: Medicare Other | Admitting: Adult Health

## 2013-09-06 ENCOUNTER — Encounter: Payer: Self-pay | Admitting: Adult Health

## 2013-09-06 ENCOUNTER — Ambulatory Visit (HOSPITAL_BASED_OUTPATIENT_CLINIC_OR_DEPARTMENT_OTHER): Payer: Medicare Other

## 2013-09-06 VITALS — BP 147/78 | HR 76 | Temp 97.6°F | Resp 18 | Ht 63.0 in | Wt 133.1 lb

## 2013-09-06 DIAGNOSIS — C50911 Malignant neoplasm of unspecified site of right female breast: Secondary | ICD-10-CM

## 2013-09-06 DIAGNOSIS — C50411 Malignant neoplasm of upper-outer quadrant of right female breast: Secondary | ICD-10-CM

## 2013-09-06 DIAGNOSIS — C773 Secondary and unspecified malignant neoplasm of axilla and upper limb lymph nodes: Secondary | ICD-10-CM

## 2013-09-06 DIAGNOSIS — C50419 Malignant neoplasm of upper-outer quadrant of unspecified female breast: Secondary | ICD-10-CM

## 2013-09-06 DIAGNOSIS — Z5111 Encounter for antineoplastic chemotherapy: Secondary | ICD-10-CM

## 2013-09-06 DIAGNOSIS — C50919 Malignant neoplasm of unspecified site of unspecified female breast: Secondary | ICD-10-CM

## 2013-09-06 DIAGNOSIS — Z170 Estrogen receptor positive status [ER+]: Secondary | ICD-10-CM

## 2013-09-06 LAB — CBC WITH DIFFERENTIAL/PLATELET
BASO%: 2 % (ref 0.0–2.0)
Basophils Absolute: 0.1 10*3/uL (ref 0.0–0.1)
EOS%: 4.8 % (ref 0.0–7.0)
Eosinophils Absolute: 0.3 10*3/uL (ref 0.0–0.5)
HCT: 36.4 % (ref 34.8–46.6)
HGB: 12.5 g/dL (ref 11.6–15.9)
LYMPH%: 26.7 % (ref 14.0–49.7)
MCH: 33 pg (ref 25.1–34.0)
MCHC: 34.3 g/dL (ref 31.5–36.0)
MCV: 96 fL (ref 79.5–101.0)
MONO#: 0.4 10*3/uL (ref 0.1–0.9)
MONO%: 7.4 % (ref 0.0–14.0)
NEUT#: 3.2 10*3/uL (ref 1.5–6.5)
NEUT%: 59.1 % (ref 38.4–76.8)
Platelets: 192 10*3/uL (ref 145–400)
RBC: 3.79 10*6/uL (ref 3.70–5.45)
RDW: 13.1 % (ref 11.2–14.5)
WBC: 5.4 10*3/uL (ref 3.9–10.3)
lymph#: 1.5 10*3/uL (ref 0.9–3.3)
nRBC: 0 % (ref 0–0)

## 2013-09-06 LAB — COMPREHENSIVE METABOLIC PANEL (CC13)
ALT: 28 U/L (ref 0–55)
AST: 19 U/L (ref 5–34)
Albumin: 3.6 g/dL (ref 3.5–5.0)
Alkaline Phosphatase: 111 U/L (ref 40–150)
Anion Gap: 9 mEq/L (ref 3–11)
BUN: 13.9 mg/dL (ref 7.0–26.0)
CO2: 26 mEq/L (ref 22–29)
Calcium: 9.1 mg/dL (ref 8.4–10.4)
Chloride: 109 mEq/L (ref 98–109)
Creatinine: 0.7 mg/dL (ref 0.6–1.1)
Glucose: 98 mg/dl (ref 70–140)
Potassium: 4.1 mEq/L (ref 3.5–5.1)
Sodium: 144 mEq/L (ref 136–145)
Total Bilirubin: 0.44 mg/dL (ref 0.20–1.20)
Total Protein: 6.5 g/dL (ref 6.4–8.3)

## 2013-09-06 MED ORDER — ONDANSETRON 8 MG/50ML IVPB (CHCC)
8.0000 mg | Freq: Once | INTRAVENOUS | Status: AC
  Administered 2013-09-06: 8 mg via INTRAVENOUS

## 2013-09-06 MED ORDER — METHOTREXATE SODIUM CHEMO INJECTION 25 MG/ML
40.0000 mg/m2 | Freq: Once | INTRAMUSCULAR | Status: AC
  Administered 2013-09-06: 65 mg via INTRAVENOUS
  Filled 2013-09-06: qty 2.6

## 2013-09-06 MED ORDER — FLUOROURACIL CHEMO INJECTION 2.5 GM/50ML
600.0000 mg/m2 | Freq: Once | INTRAVENOUS | Status: AC
  Administered 2013-09-06: 1000 mg via INTRAVENOUS
  Filled 2013-09-06: qty 20

## 2013-09-06 MED ORDER — SODIUM CHLORIDE 0.9 % IJ SOLN
10.0000 mL | INTRAMUSCULAR | Status: DC | PRN
  Administered 2013-09-06: 10 mL
  Filled 2013-09-06: qty 10

## 2013-09-06 MED ORDER — HEPARIN SOD (PORK) LOCK FLUSH 100 UNIT/ML IV SOLN
500.0000 [IU] | Freq: Once | INTRAVENOUS | Status: AC | PRN
  Administered 2013-09-06: 500 [IU]
  Filled 2013-09-06: qty 5

## 2013-09-06 MED ORDER — SODIUM CHLORIDE 0.9 % IV SOLN
Freq: Once | INTRAVENOUS | Status: AC
  Administered 2013-09-06: 10:00:00 via INTRAVENOUS

## 2013-09-06 MED ORDER — ONDANSETRON 8 MG/NS 50 ML IVPB
INTRAVENOUS | Status: AC
  Filled 2013-09-06: qty 8

## 2013-09-06 MED ORDER — SODIUM CHLORIDE 0.9 % IV SOLN
600.0000 mg/m2 | Freq: Once | INTRAVENOUS | Status: AC
  Administered 2013-09-06: 1000 mg via INTRAVENOUS
  Filled 2013-09-06: qty 50

## 2013-09-06 NOTE — Patient Instructions (Signed)
[facility] Discharge Instructions for Patients Receiving Chemotherapy  Today you received the following chemotherapy agents methotrexate/Cytoxan/5FU.  To help prevent nausea and vomiting after your treatment, we encourage you to take your nausea medication as prescribed.   If you develop nausea and vomiting that is not controlled by your nausea medication, call the clinic.   BELOW ARE SYMPTOMS THAT SHOULD BE REPORTED IMMEDIATELY:  *FEVER GREATER THAN 100.5 F  *CHILLS WITH OR WITHOUT FEVER  NAUSEA AND VOMITING THAT IS NOT CONTROLLED WITH YOUR NAUSEA MEDICATION  *UNUSUAL SHORTNESS OF BREATH  *UNUSUAL BRUISING OR BLEEDING  TENDERNESS IN MOUTH AND THROAT WITH OR WITHOUT PRESENCE OF ULCERS  *URINARY PROBLEMS  *BOWEL PROBLEMS  UNUSUAL RASH Items with * indicate a potential emergency and should be followed up as soon as possible.  Feel free to call the clinic you have any questions or concerns. The clinic phone number is [phone].    

## 2013-09-06 NOTE — Telephone Encounter (Signed)
, °

## 2013-09-06 NOTE — Patient Instructions (Signed)
Exemestane tablets What is this medicine? EXEMESTANE (ex e MES tane) blocks the production of the hormone estrogen. Some types of breast cancer depend on estrogen to grow, and this medicine can stop tumor growth by blocking estrogen production. This medicine is for the treatment of breast cancer in postmenopausal women only. This medicine may be used for other purposes; ask your health care provider or pharmacist if you have questions. What should I tell my health care provider before I take this medicine? They need to know if you have any of these conditions: -an unusual or allergic reaction to exemestane, other medicines, foods, dyes, or preservatives -pregnant or trying to get pregnant -breast-feeding How should I use this medicine? Take this medicine by mouth with a glass of water. Follow the directions on the prescription label. Take your doses at regular intervals after a meal. Do not take your medicine more often than directed. Do not stop taking except on the advice of your doctor or health care professional. Contact your pediatrician regarding the use of this medicine in children. Special care may be needed. Overdosage: If you think you have taken too much of this medicine contact a poison control center or emergency room at once. NOTE: This medicine is only for you. Do not share this medicine with others. What if I miss a dose? If you miss a dose, take the next dose as usual. Do not try to make up the missed dose. Do not take double or extra doses. What may interact with this medicine? Do not take this medicine with any of the following medications: -female hormones, like estrogens and birth control pills This medicine may also interact with the following medications: -androstenedione -phenytoin -rifabutin, rifampin, or rifapentine -St. John's Wort This list may not describe all possible interactions. Give your health care provider a list of all the medicines, herbs,  non-prescription drugs, or dietary supplements you use. Also tell them if you smoke, drink alcohol, or use illegal drugs. Some items may interact with your medicine. What should I watch for while using this medicine? Visit your doctor or health care professional for regular checks on your progress. If you experience hot flashes or sweating while taking this medicine, avoid alcohol, smoking and drinks with caffeine. This may help to decrease these side effects. What side effects may I notice from receiving this medicine? Side effects that you should report to your doctor or health care professional as soon as possible: -any new or unusual symptoms -changes in vision -fever -leg or arm swelling -pain in bones, joints, or muscles -pain in hips, back, ribs, arms, shoulders, or legs Side effects that usually do not require medical attention (report to your doctor or health care professional if they continue or are bothersome): -difficulty sleeping -headache -hot flashes -sweating -unusually weak or tired This list may not describe all possible side effects. Call your doctor for medical advice about side effects. You may report side effects to FDA at 1-800-FDA-1088. Where should I keep my medicine? Keep out of the reach of children. Store at room temperature between 15 and 30 degrees C (59 and 86 degrees F). Throw away any unused medicine after the expiration date. NOTE: This sheet is a summary. It may not cover all possible information. If you have questions about this medicine, talk to your doctor, pharmacist, or health care provider.  2013, [employer]. (03/07/2008 11:48:29 AM) Anastrozole tablets What is this medicine? ANASTROZOLE (an AS troe zole) is used to treat breast cancer in  women who have gone through menopause. Some types of breast cancer depend on estrogen to grow, and this medicine can stop tumor growth by blocking estrogen production. This medicine may be used for  other purposes; ask your health care provider or pharmacist if you have questions. What should I tell my health care provider before I take this medicine? They need to know if you have any of these conditions: -liver disease -an unusual or allergic reaction to anastrozole, other medicines, foods, dyes, or preservatives -pregnant or trying to get pregnant -breast-feeding How should I use this medicine? Take this medicine by mouth with a glass of water. Follow the directions on the prescription label. You can take this medicine with or without food. Take your doses at regular intervals. Do not take your medicine more often than directed. Do not stop taking except on the advice of your doctor or health care professional. Talk to your pediatrician regarding the use of this medicine in children. Special care may be needed. Overdosage: If you think you have taken too much of this medicine contact a poison control center or emergency room at once. NOTE: This medicine is only for you. Do not share this medicine with others. What if I miss a dose? If you miss a dose, take it as soon as you can. If it is almost time for your next dose, take only that dose. Do not take double or extra doses. What may interact with this medicine? Do not take this medicine with any of the following medications: -female hormones, like estrogens or progestins and birth control pills This medicine may also interact with the following medications: -tamoxifen This list may not describe all possible interactions. Give your health care provider a list of all the medicines, herbs, non-prescription drugs, or dietary supplements you use. Also tell them if you smoke, drink alcohol, or use illegal drugs. Some items may interact with your medicine. What should I watch for while using this medicine? Visit your doctor or health care professional for regular checks on your progress. Let your doctor or health care professional know about any  unusual vaginal bleeding. Do not treat yourself for diarrhea, nausea, vomiting or other side effects. Ask your doctor or health care professional for advice. What side effects may I notice from receiving this medicine? Side effects that you should report to your doctor or health care professional as soon as possible: -allergic reactions like skin rash, itching or hives, swelling of the face, lips, or tongue -any new or unusual symptoms -breathing problems -chest pain -leg pain or swelling -vomiting Side effects that usually do not require medical attention (report to your doctor or health care professional if they continue or are bothersome): -back or bone pain -cough, or throat infection -diarrhea or constipation -dizziness -headache -hot flashes -loss of appetite -nausea -sweating -weakness and tiredness -weight gain This list may not describe all possible side effects. Call your doctor for medical advice about side effects. You may report side effects to FDA at 1-800-FDA-1088. Where should I keep my medicine? Keep out of the reach of children. Store at room temperature between 20 and 25 degrees C (68 and 77 degrees F). Throw away any unused medicine after the expiration date. NOTE: This sheet is a summary. It may not cover all possible information. If you have questions about this medicine, talk to your doctor, pharmacist, or health care provider.  2013, [employer]. (01/14/2008 4:31:52 PM) Tamoxifen oral tablet What is this medicine? TAMOXIFEN (ta MOX  i fen) blocks the effects of estrogen. It is commonly used to treat breast cancer. It is also used to decrease the chance of breast cancer coming back in women who have received treatment for the disease. It may also help prevent breast cancer in women who have a high risk of developing breast cancer. This medicine may be used for other purposes; ask your health care provider or pharmacist if you have questions. What  should I tell my health care provider before I take this medicine? They need to know if you have any of these conditions: -blood clots -blood disease -cataracts or impaired eyesight -endometriosis -high calcium levels -high cholesterol -irregular menstrual cycles -liver disease -stroke -uterine fibroids -an unusual or allergic reaction to tamoxifen, other medicines, foods, dyes, or preservatives -pregnant or trying to get pregnant -breast-feeding How should I use this medicine? Take this medicine by mouth with a glass of water. Follow the directions on the prescription label. You can take it with or without food. Take your medicine at regular intervals. Do not take your medicine more often than directed. Do not stop taking except on your doctor's advice. A special MedGuide will be given to you by the pharmacist with each prescription and refill. Be sure to read this information carefully each time. Talk to your pediatrician regarding the use of this medicine in children. While this drug may be prescribed for selected conditions, precautions do apply. Overdosage: If you think you have taken too much of this medicine contact a poison control center or emergency room at once. NOTE: This medicine is only for you. Do not share this medicine with others. What if I miss a dose? If you miss a dose, take it as soon as you can. If it is almost time for your next dose, take only that dose. Do not take double or extra doses. What may interact with this medicine? -aminoglutethimide -bromocriptine -chemotherapy drugs -female hormones, like estrogens and birth control pills -letrozole -medroxyprogesterone -phenobarbital -rifampin -warfarin This list may not describe all possible interactions. Give your health care provider a list of all the medicines, herbs, non-prescription drugs, or dietary supplements you use. Also tell them if you smoke, drink alcohol, or use illegal drugs. Some items may  interact with your medicine. What should I watch for while using this medicine? Visit your doctor or health care professional for regular checks on your progress. You will need regular pelvic exams, breast exams, and mammograms. If you are taking this medicine to reduce your risk of getting breast cancer, you should know that this medicine does not prevent all types of breast cancer. If breast cancer or other problems occur, there is no guarantee that it will be found at an early stage. Do not become pregnant while taking this medicine or for 2 months after stopping this medicine. Stop taking this medicine if you get pregnant or think you are pregnant and contact your doctor. This medicine may harm your unborn baby. Women who can possibly become pregnant should use birth control methods that do not use hormones during tamoxifen treatment and for 2 months after therapy has stopped. Talk with your health care provider for birth control advice. Do not breast feed while taking this medicine. What side effects may I notice from receiving this medicine? Side effects that you should report to your doctor or health care professional as soon as possible: -changes in vision (blurred vision) -changes in your menstrual cycle -difficulty breathing or shortness of breath -difficulty walking or  talking -new breast lumps -numbness -pelvic pain or pressure -redness, blistering, peeling or loosening of the skin, including inside the mouth -skin rash or itching (hives) -sudden chest pain -swelling of lips, face, or tongue -swelling, pain or tenderness in your calf or leg -unusual bruising or bleeding -vaginal discharge that is bloody, brown, or rust -weakness -yellowing of the whites of the eyes or skin Side effects that usually do not require medical attention (report to your doctor or health care professional if they continue or are bothersome): -fatigue -hair loss, although uncommon and is usually  mild -headache -hot flashes -impotence (in men) -nausea, vomiting (mild) -vaginal discharge (white or clear) This list may not describe all possible side effects. Call your doctor for medical advice about side effects. You may report side effects to FDA at 1-800-FDA-1088. Where should I keep my medicine? Keep out of the reach of children. Store at room temperature between 20 and 25 degrees C (68 and 77 degrees F). Protect from light. Keep container tightly closed. Throw away any unused medicine after the expiration date. NOTE: This sheet is a summary. It may not cover all possible information. If you have questions about this medicine, talk to your doctor, pharmacist, or health care provider.  2012, [employer]. (07/20/2008 12:01:56 PM)

## 2013-09-06 NOTE — Progress Notes (Addendum)
OFFICE PROGRESS NOTE  CC Dr. Maurice Small Dr. Claud Kelp Dr. 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 Patient had a lymph node enlarged there was biopsied and was consistent with metastatic ductal carcinoma. Diagnosed October 2013.  PRIOR THERAPY:  #1 patient underwent a screening mammogram in September 2013 that showed a right breast mass with associated calcifications. On MRI the mass measured 4.7 x 3.6 x 5.2 cm lymph node measuring 1.5 x 1.0 x 2.0 cm. Patient had a needle core biopsy performed that showed invasive mammary carcinoma ER positive PR positive HER-2/neu negative with Ki-67 elevated at 66%. The lymph node was biopsied and was consistent with metastatic ductal carcinoma.  #2 patient was seen in the multidisciplinary breast clinic for discussion of treatment options. She had a Oncotype DX testing performed on the biopsied tissue her recurrence score was 28 giving her 18% risk of distant recurrence with antiestrogen therapy. She was in the intermediate risk category. Patient was enrolled on neoadjuvant antiestrogen treatment clinical trial.  #3 she is status post neoadjuvant letrozole 2.5 mg starting in November 2013 - June 2014.   #4 patient also has prior history of left lumpectomy and radiation for DCIS treated in 2003. This tumor was apparently ER and PR negative so she did not receive adjuvant tamoxifen.  #5 patient is now status post right modified radical mastectomy with axillary lymph node dissection. The final pathology revealed 4.3 cm invasive ductal carcinoma 5 lymph nodes were positive for metastatic disease. Tumor ER +100% PR negative Ki-67 66%. Pathologic staging T2 N2. Patient's case was discussed at the multidisciplinary breast conference. Adjuvant chemotherapy was recommended either doing Taxotere Cytoxan every 3 weeks. However  Dexamtheasone usage, patient may not be a good candidate for this. We therefore discussed the possibility of doing CMF every 21 days for a total of 6 cycles. Once she completes the chemotherapy then she will proceed to postmastectomy radiation therapy.  #6 CMF every 21 days adjuvantly started 05/23/2013  CURRENT THERAPY: Cycle 6 day 1  INTERVAL HISTORY: Mary Young 72 y.o. female returns for followup visit today prior to her last chemotherapy.  She has an appointment with Dr. Michell Heinrich tomorrow.  She has some mild aches and pains.  She would like to receive reading information about each antiestrogen therapy prior to discussions about each one.  She is unsure of when her last bone density was, but will bring this information by our office tomorrow.  She denies fevers, chills, nausea, vomiting, consitpation, diarrhea, numbness, or any further concerns.    MEDICAL HISTORY: Past Medical History  Diagnosis Date  . Breast cancer     left lumpectomy  . Hypertension   . Glaucoma   . Central retinal vein occlusion   . Joint pain   . Dyslipidemia   . H/O echocardiogram 09/27/2009    Note to have some aortic valve sclerosis, mild mitral valve prolapse, mild mitral regurgitation, and mild asymptomatic LVH with an EF>55%  . History of stress test 09/27/2009    Normal Myocardial perfusion study. No significant ischemia demonstrated, this low risk scan. compared to the previous study.,there is no significant change, Normal myocardial Perfusion imaging. EF>83%  . Seasonal allergies     ALLERGIES:  is allergic to vicodin; percocet; precipitated sulfur; and timolol.  MEDICATIONS:  Current Outpatient Prescriptions  Medication Sig Dispense Refill  . aspirin EC 81 MG tablet Take  81 mg by mouth 2 (two) times daily.       . B Complex-C (SUPER B COMPLEX PO) Take 1 tablet by mouth daily.       . Chlorpheniramine Maleate (CHLOR-TRIMETON PO) Take 1 tablet by mouth as needed.      . dorzolamide  (TRUSOPT) 2 % ophthalmic solution Place 1 drop into both eyes 2 (two) times daily.       . fish oil-omega-3 fatty acids 1000 MG capsule Take 2 g by mouth daily.      Marland Kitchen lidocaine-prilocaine (EMLA) cream Apply topically as needed.  30 g  0  . losartan (COZAAR) 50 MG tablet Take 50 mg by mouth daily.      . Multiple Vitamin (MULTIVITAMIN WITH MINERALS) TABS Take 1 tablet by mouth daily.      . prochlorperazine (COMPAZINE) 10 MG tablet Take 1 tablet (10 mg total) by mouth every 6 (six) hours as needed (Nausea or vomiting).  30 tablet  1  . simvastatin (ZOCOR) 20 MG tablet Take 20 mg by mouth at bedtime.       . timolol (TIMOPTIC) 0.5 % ophthalmic solution Place 1 drop into both eyes 2 (two) times daily. Non preservative      . TRAVATAN Z 0.004 % SOLN ophthalmic solution Place 1 drop into both eyes at bedtime.       . vitamin E 400 UNIT capsule Take 400 Units by mouth daily.      . Calcium-Vitamin D-Vitamin K 500-100-40 MG-UNT-MCG CHEW Chew 1 tablet by mouth daily.        No current facility-administered medications for this visit.   Facility-Administered Medications Ordered in Other Visits  Medication Dose Route Frequency Provider Last Rate Last Dose  . 0.9 %  sodium chloride infusion   Intravenous Once Illa Level, NP        SURGICAL HISTORY:  Past Surgical History  Procedure Laterality Date  . Abdominal hysterectomy  1991  . Breast lumpectomy  2003  . Arthroscopy knee w/ drilling  2005/2008    Left knee/Right knee  . Tubal ligation  1983  . Mastectomy modified radical Right 04/15/2013    Procedure: MASTECTOMY MODIFIED RADICAL;  Surgeon: Ernestene Mention, MD;  Location: [facility] OR;  Service: General;  Laterality: Right;  . Portacath placement Left 05/13/2013    Procedure: INSERTION PORT-A-CATH;  Surgeon: Ernestene Mention, MD;  Location: [facility] OR;  Service: General;  Laterality: Left;    REVIEW OF SYSTEMS:  A 10 point review of systems was conducted and is otherwise negative except for what  is noted above.      PHYSICAL EXAMINATION: Blood pressure 147/78, pulse 76, temperature 97.6 F (36.4 C), temperature source Oral, resp. rate 18, height 5\' 3"  (1.6 m), weight 133 lb 1.6 oz (60.374 kg). Body mass index is 23.58 kg/(m^2). General: Patient is a well appearing female in no acute distress HEENT: PERRLA, sclerae anicteric no conjunctival pallor, MMM Neck: supple, no palpable adenopathy Lungs: clear to auscultation bilaterally, no wheezes, rhonchi, or rales Cardiovascular: regular rate rhythm, S1, S2, no murmurs, rubs or gallops Abdomen: Soft, non-tender, non-distended, normoactive bowel sounds, no HSM Extremities: warm and well perfused, no clubbing, cyanosis, or edema Skin: No rashes or lesions Neuro: Non-focal Breasts: deferred.   ECOG PERFORMANCE STATUS: 0 - Asymptomatic      LABORATORY DATA: Lab Results  Component Value Date   WBC 5.4 09/06/2013   HGB 12.5 09/06/2013   HCT 36.4 09/06/2013   MCV  96.0 09/06/2013   PLT 192 09/06/2013      Chemistry      Component Value Date/Time   NA 144 09/06/2013 0845   NA 143 05/11/2013 0940   K 4.1 09/06/2013 0845   K 3.8 05/11/2013 0940   CL 106 05/11/2013 0940   CL 107 03/22/2013 0811   CO2 26 09/06/2013 0845   CO2 27 05/11/2013 0940   BUN 13.9 09/06/2013 0845   BUN 14 05/11/2013 0940   CREATININE 0.7 09/06/2013 0845   CREATININE 0.60 05/11/2013 0940      Component Value Date/Time   CALCIUM 9.1 09/06/2013 0845   CALCIUM 9.2 05/11/2013 0940   ALKPHOS 111 09/06/2013 0845   ALKPHOS 69 04/08/2013 0942   AST 19 09/06/2013 0845   AST 22 04/08/2013 0942   ALT 28 09/06/2013 0845   ALT 20 04/08/2013 0942   BILITOT 0.44 09/06/2013 0845   BILITOT 0.6 04/08/2013 0942     ADDITIONAL INFORMATION: PROGNOSTIC INDICATORS - ACIS Results: IMMUNOHISTOCHEMICAL AND MORPHOMETRIC ANALYSIS BY THE AUTOMATED CELLULAR IMAGING SYSTEM (ACIS) Estrogen Receptor: 100%, POSITIVE, STRONG STAINING INTENSITY Progesterone Receptor: 0%,  NEGATIVE COMMENT: The negative hormone receptor study in this case has no internal positive control. REFERENCE RANGE ESTROGEN RECEPTOR NEGATIVE <1% POSITIVE =>1% PROGESTERONE RECEPTOR NEGATIVE <1% POSITIVE =>1% All controls stained appropriately Jimmy Picket MD Pathologist, Electronic Signature ( Signed 04/22/2013) CHROMOGENIC IN-SITU HYBRIDIZATION Results: HER-2/NEU BY CISH - NO AMPLIFICATION OF HER-2 DETECTED. RESULT RATIO OF HER2: CEP 17 SIGNALS 0.91 AVERAGE HER2 COPY NUMBER PER CELL 2.15 1 of 4 FINAL for SHA, BURLING ([id number]) ADDITIONAL INFORMATION:(continued) REFERENCE RANGE NEGATIVE HER2/Chr17 Ratio <2.0 and Average HER2 copy number <4.0 EQUIVOCAL HER2/Chr17 Ratio <2.0 and Average HER2 copy number 4.0 and <6.0 POSITIVE HER2/Chr17 Ratio >=2.0 and/or Average HER2 copy number >=6.0 Jimmy Picket MD Pathologist, Electronic Signature ( Signed 04/22/2013) FINAL DIAGNOSIS Diagnosis Breast, modified radical mastectomy , Right and axillary contents - RESIDUAL INVASIVE DUCAL CARCINOMA, NOTTINGHAM COMBINED HISTOLOGIC GRADE II, 4.3 CM - INTERMEDIATE GRADE DUCTAL CARCINOMA IN SITU. - ANGIOLYMPHATIC INVASION PRESENT. - EXTENSIVE STROMAL FIBROSIS, CONSISTENT WITH POST-TREATMENT EFFECT. - FIVE OF TWENTY-THREE LYMPH NODES, POSITIVE FOR METASTATIC CARCINOMA (5/23). - RESECTION MARGINS, NEGATIVE FOR ATYPIA OR MALIGNANCY. - PLEASE SEE ONCOLOGY TEMPLATE FOR DETAILS. Microscopic Comment BREAST, INVASIVE TUMOR, WITH LYMPH NODE SAMPLING Specimen, including laterality: Right breast and axillary lymph nodes Procedure: Modified radical mastectomy with lymph node dissection Grade: II Tubule formation: 2 Nuclear pleomorphism: 2 Mitotic: 1 Tumor size (gross measurement): 4.3 cm (clusters of tumor cells contiguously spread throughout the sections; dense stromal fibrosis in the background, consistent with post-treatment effect) Margins: Negative Invasive, distance to closest margin:  Deep margin: more than 1 cm In-situ, distance to closest margin: Deep margin: more then 1 cm Lymphovascular invasion: Present Ductal carcinoma in situ: Present Grade: Intermediate grade Extensive intraductal component: No Lobular neoplasia: N/A Tumor focality: Unifocal Treatment effect: Present, only in the breast tissue Extent of tumor: Skin: No Nipple: No Skeletal muscle: N/A Lymph nodes: # examined: 23 Lymph nodes with metastasis: 5 2 of 4 FINAL for Mary Young, Mary Young ([id number]) Microscopic Comment(continued) Isolated tumor cells (< 0.2 mm): 0 Micrometastasis: (> 0.2 mm and < 2.0 mm): 0 Macrometastasis: (> 2.0 mm): 5 Extracapsular extension: No Breast prognostic profile: Please correlate with previous case [phone] Estrogen receptor: 100%, positive, strong staining intensity. Progesterone receptor: 96%, positive, strong staining intensity. Her 2 neu: No amplification, the ratio of Her 2:CEP 17 signals was 0.86 Ki-67: 66%; The breast prognostic profile  will be repeated and an addendum report will follow. Non-neoplastic breast: Dense stromal fibrosis, consistent with post-treatment effect. TNM: ypT2, ypN2a Comments: A breast prognostic profile will be repeated and an addendum report will follow. Sections show invasive ductal carcinoma with micropapillary lobular features. Immunohistochemical stain for E-Cadherin was performed an the tumor cells are strongly positive for E-Cadherin. The tumor is contiguously spreading throughout the sections with associated fibrotic stroma in the background; the morphologic features are mostly consistent with post-treatment effect. (HCL:kh 04-18-13) Abigail Miyamoto MD Pathologist, Electronic Signature (Case signed 04/20/2013) Specimen Gross and Clinical Information   RADIOGRAPHIC STUDIES:  No results found.  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 The tumor was ER positive PR negative HER-2/neu negative with an elevated Ki-67 of 66%. She received neoadjuvant antiestrogen therapy consisting of letrozole 2.5 mg. She had an Oncotype DX testing performed on her biopsy tissue that put her in the intermediate risk category.   #2 patient is now status post right modified radical mastectomy with axillary lymph node dissection. She does have significant residual disease measuring 4.3 cm which is ER positive PR negative HER-2/neu negative. 5 of 23 lymph nodes were positive for metastatic disease.   #3 patient with prior history of left breast cancer which was DCIS she underwent a lumpectomy followed by radiation.  #4 Patient was recommended chemotherapy consisting of CMF to be given every 3 weeks. We cannot use other agents do to her comorbidities and the use of steroids. Corticosteroid usage is contraindicated in patient due to her primary eye issues.  Currently CMF cycle 6 day 1.  PLAN:   #1  Doing well, patient will proceed with her final cycle of CMF today.  She and I reviewed the future plan regarding radiation, then anti estrogen therapy, and the appointment interval for follow up.  She will bring me her bone density report tomorrow.    #2  I printed out information regarding anti-estrogen therapy for her to review.    #3 She will return to clinic next week for labs, an appointment.  She will see Dr. Michell Heinrich tomorrow to discuss her radiation therapy.     I spent 25 minutes counseling the patient face to face.  The total time spent in the appointment was 30 minutes.  Illa Level, NP Medical Oncology [facility] [phone] 09/07/2013, 8:26 AM   ATTENDING'S ATTESTATION:  I personally reviewed patient's chart, examined patient myself, formulated the treatment plan as followed.    Patient will now complete cycle 6 of CMF. Overall she tolerated it very well. When she finishes the chemotherapy and  has recovered her counts she will proceed with adjuvant radiation therapy. She and I discussed antiestrogen therapy. She most likely will receive an aromatase inhibitor. Information was given to her.  I will plan on seeing her back in one week's time for followup.  Drue Second, MD Medical/Oncology [facility] [phone] (beeper) [phone] (Office)  09/07/2013, 8:57 AM

## 2013-09-07 ENCOUNTER — Ambulatory Visit: Payer: Medicare Other

## 2013-09-07 ENCOUNTER — Ambulatory Visit (HOSPITAL_BASED_OUTPATIENT_CLINIC_OR_DEPARTMENT_OTHER): Payer: Medicare Other

## 2013-09-07 ENCOUNTER — Ambulatory Visit
Admission: RE | Admit: 2013-09-07 | Discharge: 2013-09-07 | Disposition: A | Discharge status: Home / Self Care | Payer: Medicare Other | Source: Ambulatory Visit | Attending: Radiation Oncology | Admitting: Radiation Oncology

## 2013-09-07 ENCOUNTER — Encounter: Payer: Self-pay | Admitting: Radiation Oncology

## 2013-09-07 VITALS — BP 151/57 | HR 65 | Temp 98.0°F | Wt 134.0 lb

## 2013-09-07 VITALS — BP 154/81 | HR 87 | Temp 98.0°F

## 2013-09-07 DIAGNOSIS — Z79899 Other long term (current) drug therapy: Secondary | ICD-10-CM | POA: Insufficient documentation

## 2013-09-07 DIAGNOSIS — C50411 Malignant neoplasm of upper-outer quadrant of right female breast: Secondary | ICD-10-CM

## 2013-09-07 DIAGNOSIS — C50419 Malignant neoplasm of upper-outer quadrant of unspecified female breast: Secondary | ICD-10-CM

## 2013-09-07 DIAGNOSIS — C773 Secondary and unspecified malignant neoplasm of axilla and upper limb lymph nodes: Secondary | ICD-10-CM | POA: Insufficient documentation

## 2013-09-07 DIAGNOSIS — Z923 Personal history of irradiation: Secondary | ICD-10-CM | POA: Insufficient documentation

## 2013-09-07 DIAGNOSIS — C50919 Malignant neoplasm of unspecified site of unspecified female breast: Secondary | ICD-10-CM | POA: Insufficient documentation

## 2013-09-07 DIAGNOSIS — C50911 Malignant neoplasm of unspecified site of right female breast: Secondary | ICD-10-CM

## 2013-09-07 MED ORDER — PEGFILGRASTIM INJECTION 6 MG/0.6ML
6.0000 mg | Freq: Once | SUBCUTANEOUS | Status: AC
  Administered 2013-09-07: 6 mg via SUBCUTANEOUS
  Filled 2013-09-07: qty 0.6

## 2013-09-07 NOTE — Progress Notes (Signed)
Please see the Nurse Progress Note in the MD Initial Consult Encounter for this patient. 

## 2013-09-08 ENCOUNTER — Ambulatory Visit: Payer: Medicare Other | Admitting: Radiation Oncology

## 2013-09-12 ENCOUNTER — Other Ambulatory Visit (INDEPENDENT_AMBULATORY_CARE_PROVIDER_SITE_OTHER): Payer: Self-pay | Admitting: *Deleted

## 2013-09-12 MED ORDER — UNABLE TO FIND: Status: DC

## 2013-09-13 ENCOUNTER — Other Ambulatory Visit: Payer: Medicare Other | Admitting: Lab

## 2013-09-13 ENCOUNTER — Ambulatory Visit: Payer: Medicare Other | Admitting: Adult Health

## 2013-09-13 ENCOUNTER — Ambulatory Visit
Admission: RE | Admit: 2013-09-13 | Discharge: 2013-09-13 | Disposition: A | Discharge status: Home / Self Care | Payer: Medicare Other | Source: Ambulatory Visit | Attending: Radiation Oncology | Admitting: Radiation Oncology

## 2013-09-13 ENCOUNTER — Ambulatory Visit (HOSPITAL_BASED_OUTPATIENT_CLINIC_OR_DEPARTMENT_OTHER): Payer: Medicare Other | Admitting: Oncology

## 2013-09-13 ENCOUNTER — Other Ambulatory Visit (HOSPITAL_BASED_OUTPATIENT_CLINIC_OR_DEPARTMENT_OTHER): Payer: Medicare Other | Admitting: Lab

## 2013-09-13 ENCOUNTER — Ambulatory Visit (HOSPITAL_BASED_OUTPATIENT_CLINIC_OR_DEPARTMENT_OTHER): Payer: Medicare Other

## 2013-09-13 VITALS — BP 135/74 | HR 73 | Temp 97.2°F | Resp 18 | Ht 63.0 in | Wt 130.2 lb

## 2013-09-13 DIAGNOSIS — R5381 Other malaise: Secondary | ICD-10-CM | POA: Insufficient documentation

## 2013-09-13 DIAGNOSIS — Z79899 Other long term (current) drug therapy: Secondary | ICD-10-CM | POA: Insufficient documentation

## 2013-09-13 DIAGNOSIS — C50911 Malignant neoplasm of unspecified site of right female breast: Secondary | ICD-10-CM

## 2013-09-13 DIAGNOSIS — L299 Pruritus, unspecified: Secondary | ICD-10-CM | POA: Insufficient documentation

## 2013-09-13 DIAGNOSIS — L589 Radiodermatitis, unspecified: Secondary | ICD-10-CM | POA: Insufficient documentation

## 2013-09-13 DIAGNOSIS — Z7982 Long term (current) use of aspirin: Secondary | ICD-10-CM | POA: Insufficient documentation

## 2013-09-13 DIAGNOSIS — R21 Rash and other nonspecific skin eruption: Secondary | ICD-10-CM | POA: Insufficient documentation

## 2013-09-13 DIAGNOSIS — Y842 Radiological procedure and radiotherapy as the cause of abnormal reaction of the patient, or of later complication, without mention of misadventure at the time of the procedure: Secondary | ICD-10-CM | POA: Insufficient documentation

## 2013-09-13 DIAGNOSIS — Z510 Encounter for antineoplastic radiation therapy: Secondary | ICD-10-CM | POA: Insufficient documentation

## 2013-09-13 DIAGNOSIS — C773 Secondary and unspecified malignant neoplasm of axilla and upper limb lymph nodes: Secondary | ICD-10-CM

## 2013-09-13 DIAGNOSIS — C50411 Malignant neoplasm of upper-outer quadrant of right female breast: Secondary | ICD-10-CM

## 2013-09-13 DIAGNOSIS — C50419 Malignant neoplasm of upper-outer quadrant of unspecified female breast: Secondary | ICD-10-CM

## 2013-09-13 DIAGNOSIS — C50919 Malignant neoplasm of unspecified site of unspecified female breast: Secondary | ICD-10-CM

## 2013-09-13 DIAGNOSIS — M79609 Pain in unspecified limb: Secondary | ICD-10-CM | POA: Insufficient documentation

## 2013-09-13 LAB — CBC WITH DIFFERENTIAL/PLATELET
BASO%: 0.6 % (ref 0.0–2.0)
Basophils Absolute: 0.1 10*3/uL (ref 0.0–0.1)
EOS%: 2.9 % (ref 0.0–7.0)
Eosinophils Absolute: 0.4 10*3/uL (ref 0.0–0.5)
HCT: 32.4 % — ABNORMAL LOW (ref 34.8–46.6)
HGB: 11 g/dL — ABNORMAL LOW (ref 11.6–15.9)
LYMPH%: 7.3 % — ABNORMAL LOW (ref 14.0–49.7)
MCH: 33 pg (ref 25.1–34.0)
MCHC: 33.8 g/dL (ref 31.5–36.0)
MCV: 97.6 fL (ref 79.5–101.0)
MONO#: 0.5 10*3/uL (ref 0.1–0.9)
MONO%: 3.5 % (ref 0.0–14.0)
NEUT#: 12 10*3/uL — ABNORMAL HIGH (ref 1.5–6.5)
NEUT%: 85.7 % — ABNORMAL HIGH (ref 38.4–76.8)
Platelets: 149 10*3/uL (ref 145–400)
RBC: 3.32 10*6/uL — ABNORMAL LOW (ref 3.70–5.45)
RDW: 13 % (ref 11.2–14.5)
WBC: 14 10*3/uL — ABNORMAL HIGH (ref 3.9–10.3)
lymph#: 1 10*3/uL (ref 0.9–3.3)

## 2013-09-13 LAB — COMPREHENSIVE METABOLIC PANEL (CC13)
ALT: 16 U/L (ref 0–55)
AST: 18 U/L (ref 5–34)
Albumin: 3.6 g/dL (ref 3.5–5.0)
Alkaline Phosphatase: 160 U/L — ABNORMAL HIGH (ref 40–150)
Anion Gap: 8 mEq/L (ref 3–11)
BUN: 13.3 mg/dL (ref 7.0–26.0)
CO2: 25 mEq/L (ref 22–29)
Calcium: 8.9 mg/dL (ref 8.4–10.4)
Chloride: 108 mEq/L (ref 98–109)
Creatinine: 0.7 mg/dL (ref 0.6–1.1)
Glucose: 95 mg/dl (ref 70–140)
Potassium: 4.1 mEq/L (ref 3.5–5.1)
Sodium: 142 mEq/L (ref 136–145)
Total Bilirubin: 0.43 mg/dL (ref 0.20–1.20)
Total Protein: 6.3 g/dL — ABNORMAL LOW (ref 6.4–8.3)

## 2013-09-13 MED ORDER — SODIUM CHLORIDE 0.9 % IJ SOLN
10.0000 mL | INTRAMUSCULAR | Status: DC | PRN
  Administered 2013-09-13: 10 mL via INTRAVENOUS
  Filled 2013-09-13: qty 10

## 2013-09-13 MED ORDER — HEPARIN SOD (PORK) LOCK FLUSH 100 UNIT/ML IV SOLN
500.0000 [IU] | Freq: Once | INTRAVENOUS | Status: AC
  Administered 2013-09-13: 500 [IU] via INTRAVENOUS
  Filled 2013-09-13: qty 5

## 2013-09-13 NOTE — Patient Instructions (Signed)
Proceed with radiation therapy as per Dr. Michell Heinrich  Recommend beginning oscal 2 tabs daily and vitamin D3 1000 iu daily  We will see you in second week of January 2015

## 2013-09-13 NOTE — Progress Notes (Signed)
Name: Mary Young   MRN: [id number]  Date:  09/13/2013  DOB: 11-28-1940  Status:outpatient    DIAGNOSIS: Breast cancer.  CONSENT VERIFIED: yes   SET UP: Patient is setup supine   IMMOBILIZATION:  The following immobilization was used:Custom Moldable Pillow, breast board.   NARRATIVE: [name] was brought to the CT Simulation planning suite.  Identity was confirmed.  All relevant records and images related to the planned course of therapy were reviewed.  Then, the patient was positioned in a stable reproducible clinical set-up for radiation therapy.  Wires were placed to delineate the clinical extent of breast tissue. A wire was placed on the scar as well.  CT images were obtained.  An isocenter was placed. Skin markings were placed.  The CT images were loaded into the planning software where the target and avoidance structures were contoured.  The radiation prescription was entered and confirmed. The patient was discharged in stable condition and tolerated simulation well.    TREATMENT PLANNING NOTE:  Treatment planning then occurred. I have requested : MLC's, isodose plan, basic dose calculation  I personally designed and supervised the construction of 5 medically necessary complex treatment devices for the protection of critical normal structures including the lungs and contralateral breast as well as the immobilization device which is necessary for set up certainty.

## 2013-09-13 NOTE — Patient Instructions (Signed)
Implanted Port Instructions  An implanted port is a central line that has a round shape and is placed under the skin. It is used for long-term IV (intravenous) access for:  · Medicine.  · Fluids.  · Liquid nutrition, such as TPN (total parenteral nutrition).  · Blood samples.  Ports can be placed:  · In the chest area just below the collarbone (this is the most common place.)  · In the arms.  · In the belly (abdomen) area.  · In the legs.  PARTS OF THE PORT  A port has 2 main parts:  · The reservoir. The reservoir is round, disc-shaped, and will be a small, raised area under your skin.  · The reservoir is the part where a needle is inserted (accessed) to either give medicines or to draw blood.  · The catheter. The catheter is a long, slender tube that extends from the reservoir. The catheter is placed into a large vein.  · Medicine that is inserted into the reservoir goes into the catheter and then into the vein.  INSERTION OF THE PORT  · The port is surgically placed in either an operating room or in a procedural area (interventional radiology).  · Medicine may be given to help you relax during the procedure.  · The skin where the port will be inserted is numbed (local anesthetic).  · 1 or 2 small cuts (incisions) will be made in the skin to insert the port.  · The port can be used after it has been inserted.  INCISION SITE CARE  · The incision site may have small adhesive strips on it. This helps keep the incision site closed. Sometimes, no adhesive strips are placed. Instead of adhesive strips, a special kind of surgical glue is used to keep the incision closed.  · If adhesive strips were placed on the incision sites, do not take them off. They will fall off on their own.  · The incision site may be sore for 1 to 2 days. Pain medicine can help.  · Do not get the incision site wet. Bathe or shower as directed by your caregiver.  · The incision site should heal in 5 to 7 days. A small scar may form after the  incision has healed.  ACCESSING THE PORT  Special steps must be taken to access the port:  · Before the port is accessed, a numbing cream can be placed on the skin. This helps numb the skin over the port site.  · A sterile technique is used to access the port.  · The port is accessed with a needle. Only "non-coring" port needles should be used to access the port. Once the port is accessed, a blood return should be checked. This helps ensure the port is in the vein and is not clogged (clotted).  · If your caregiver believes your port should remain accessed, a clear (transparent) bandage will be placed over the needle site. The bandage and needle will need to be changed every week or as directed by your caregiver.  · Keep the bandage covering the needle clean and dry. Do not get it wet. Follow your caregiver's instructions on how to take a shower or bath when the port is accessed.  · If your port does not need to stay accessed, no bandage is needed over the port.  FLUSHING THE PORT  Flushing the port keeps it from getting clogged. How often the port is flushed depends on:  · If a   constant infusion is running. If a constant infusion is running, the port may not need to be flushed.  · If intermittent medicines are given.  · If the port is not being used.  For intermittent medicines:  · The port will need to be flushed:  · After medicines have been given.  · After blood has been drawn.  · As part of routine maintenance.  · A port is normally flushed with:  · Normal saline.  · Heparin.  · Follow your caregiver's advice on how often, how much, and the type of flush to use on your port.  IMPORTANT PORT INFORMATION  · Tell your caregiver if you are allergic to heparin.  · After your port is placed, you will get a manufacturer's information card. The card has information about your port. Keep this card with you at all times.  · There are many types of ports available. Know what kind of port you have.  · In case of an  emergency, it may be helpful to wear a medical alert bracelet. This can help alert health care workers that you have a port.  · The port can stay in for as long as your caregiver believes it is necessary.  · When it is time for the port to come out, surgery will be done to remove it. The surgery will be similar to how the port was put in.  · If you are in the hospital or clinic:  · Your port will be taken care of and flushed by a nurse.  · If you are at home:  · A home health care nurse may give medicines and take care of the port.  · You or a family member can get special training and directions for giving medicine and taking care of the port at home.  SEEK IMMEDIATE MEDICAL CARE IF:   · Your port does not flush or you are unable to get a blood return.  · New drainage or pus is coming from the incision.  · A bad smell is coming from the incision site.  · You develop swelling or increased redness at the incision site.  · You develop increased swelling or pain at the port site.  · You develop swelling or pain in the surrounding skin near the port.  · You have an oral temperature above 102° F (38.9° C), not controlled by medicine.  MAKE SURE YOU:   · Understand these instructions.  · Will watch your condition.  · Will get help right away if you are not doing well or get worse.  Document Released: 11/03/2005 Document Revised: 01/26/2012 Document Reviewed: 01/25/2009  [employer]® Patient Information ©2014 [employer].

## 2013-09-13 NOTE — Progress Notes (Signed)
OFFICE PROGRESS NOTE  CC Dr. Maurice Small Dr. Claud Kelp Dr. 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 Patient had a lymph node enlarged there was biopsied and was consistent with metastatic ductal carcinoma. Diagnosed October 2013.  PRIOR THERAPY:  #1 patient underwent a screening mammogram in September 2013 that showed a right breast mass with associated calcifications. On MRI the mass measured 4.7 x 3.6 x 5.2 cm lymph node measuring 1.5 x 1.0 x 2.0 cm. Patient had a needle core biopsy performed that showed invasive mammary carcinoma ER positive PR positive HER-2/neu negative with Ki-67 elevated at 66%. The lymph node was biopsied and was consistent with metastatic ductal carcinoma.  #2 patient was seen in the multidisciplinary breast clinic for discussion of treatment options. She had a Oncotype DX testing performed on the biopsied tissue her recurrence score was 28 giving her 18% risk of distant recurrence with antiestrogen therapy. She was in the intermediate risk category. Patient was enrolled on neoadjuvant antiestrogen treatment clinical trial.  #3 she is status post neoadjuvant letrozole 2.5 mg starting in November 2013 - June 2014.   #4 patient also has prior history of left lumpectomy and radiation for DCIS treated in 2003. This tumor was apparently ER and PR negative so she did not receive adjuvant tamoxifen.  #5 patient is now status post right modified radical mastectomy with axillary lymph node dissection. The final pathology revealed 4.3 cm invasive ductal carcinoma 5 lymph nodes were positive for metastatic disease. Tumor ER +100% PR negative Ki-67 66%. Pathologic staging T2 N2. Patient's case was discussed at the multidisciplinary breast conference. Adjuvant chemotherapy was recommended either doing Taxotere Cytoxan every 3 weeks. However  Dexamtheasone usage, patient may not be a good candidate for this. We therefore discussed the possibility of doing CMF every 21 days for a total of 6 cycles. Once she completes the chemotherapy then she will proceed to postmastectomy radiation therapy.  #6 CMF every 21 days adjuvantly started 05/23/2013-08/30/2013  CURRENT THERAPY: Status post 6 cycles of CMF  INTERVAL HISTORY: Mary Young 72 y.o. female returns for followup visit today. Overall patient has done well. She is now status post 6 cycles of her chemotherapy. She did receive Neulasta with her last injection. It didn't cause aches and pains but these are now resolving. Her counts look terrific. She was seen by Dr. Chipper Herb and she is set up to be simulated in the next few weeks. She denies any nausea vomiting fevers chills night sweats headaches shortness of breath chest pains palpitations she has no myalgias and arthralgias. Remainder of the 10 point review of systems is negative.  MEDICAL HISTORY: Past Medical History  Diagnosis Date  . Breast cancer     left lumpectomy  . Hypertension   . Glaucoma   . Central retinal vein occlusion   . Joint pain   . Dyslipidemia   . H/O echocardiogram 09/27/2009    Note to have some aortic valve sclerosis, mild mitral valve prolapse, mild mitral regurgitation, and mild asymptomatic LVH with an EF>55%  . History of stress test 09/27/2009    Normal Myocardial perfusion study. No significant ischemia demonstrated, this low risk scan. compared to the previous study.,there is no significant change, Normal myocardial Perfusion imaging. EF>83%  . Seasonal allergies     ALLERGIES:  is allergic to vicodin; percocet; precipitated sulfur; and timolol.  MEDICATIONS:  Current Outpatient Prescriptions  Medication Sig Dispense Refill  . Ascorbic Acid (VITAMIN C PO) Take by mouth.      Marland Kitchen aspirin EC 81 MG tablet Take 81 mg by mouth 2 (two) times daily.       . B Complex-C (SUPER B COMPLEX PO)  Take 1 tablet by mouth daily.       . Calcium-Vitamin D-Vitamin K 500-100-40 MG-UNT-MCG CHEW Chew 2 tablets by mouth daily.       . Chlorpheniramine Maleate (CHLOR-TRIMETON PO) Take 1 tablet by mouth as needed.      . dorzolamide (TRUSOPT) 2 % ophthalmic solution Place 1 drop into both eyes 2 (two) times daily.       . fish oil-omega-3 fatty acids 1000 MG capsule Take 2 g by mouth daily.      Marland Kitchen losartan (COZAAR) 50 MG tablet Take 50 mg by mouth daily.      . Multiple Vitamin (MULTIVITAMIN WITH MINERALS) TABS Take 1 tablet by mouth daily.      . simvastatin (ZOCOR) 20 MG tablet Take 20 mg by mouth every other day.       . timolol (TIMOPTIC) 0.5 % ophthalmic solution Place 1 drop into both eyes 2 (two) times daily. Non preservative      . TRAVATAN Z 0.004 % SOLN ophthalmic solution Place 1 drop into both eyes at bedtime.       . vitamin E 400 UNIT capsule Take 400 Units by mouth daily.      Marland Kitchen lidocaine-prilocaine (EMLA) cream Apply topically as needed.  30 g  0  . UNABLE TO FIND Rx: L8000- Post Surgical Bras (Quantity: 6) L8030- Silicone Breast Prosthesis (Quantity: 1) Dx: 174.9; Right mastectomy  1 each  0   No current facility-administered medications for this visit.   Facility-Administered Medications Ordered in Other Visits  Medication Dose Route Frequency Provider Last Rate Last Dose  . 0.9 %  sodium chloride infusion   Intravenous Once Illa Level, NP        SURGICAL HISTORY:  Past Surgical History  Procedure Laterality Date  . Abdominal hysterectomy  1991  . Breast lumpectomy  2003  . Arthroscopy knee w/ drilling  2005/2008    Left knee/Right knee  . Tubal ligation  1983  . Mastectomy modified radical Right 04/15/2013    Procedure: MASTECTOMY MODIFIED RADICAL;  Surgeon: Ernestene Mention, MD;  Location[facility] OR;  Service: General;  Laterality: Right;  . Portacath placement Left 05/13/2013    Procedure: INSERTION PORT-A-CATH;  Surgeon: Ernestene Mention, MD;  Location: [facility] OR;   Service: General;  Laterality: Left;    REVIEW OF SYSTEMS:  A 10 point review of systems was conducted and is otherwise negative except for what is noted above.      PHYSICAL EXAMINATION: Blood pressure 135/74, pulse 73, temperature 97.2 F (36.2 C), temperature source Oral, resp. rate 18, height 5\' 3"  (1.6 m), weight 130 lb 3.2 oz (59.058 kg). Body mass index is 23.07 kg/(m^2). General: Patient is a well appearing female in no acute distress HEENT: PERRLA, sclerae anicteric no conjunctival pallor, MMM Neck: supple, no palpable adenopathy Lungs: clear to auscultation bilaterally, no wheezes, rhonchi, or rales Cardiovascular: regular rate rhythm, S1, S2, no murmurs, rubs or gallops Abdomen: Soft, non-tender, non-distended, normoactive bowel sounds, no HSM Extremities: warm and well perfused, no clubbing, cyanosis, or edema Skin: No rashes or lesions Neuro: Non-focal Breasts: deferred.   ECOG PERFORMANCE STATUS: 0 - Asymptomatic  LABORATORY DATA: Lab Results  Component Value Date   WBC 14.0* 09/13/2013   HGB 11.0* 09/13/2013   HCT 32.4* 09/13/2013   MCV 97.6 09/13/2013   PLT 149 09/13/2013      Chemistry      Component Value Date/Time   NA 142 09/13/2013 0945   NA 143 05/11/2013 0940   K 4.1 09/13/2013 0945   K 3.8 05/11/2013 0940   CL 106 05/11/2013 0940   CL 107 03/22/2013 0811   CO2 25 09/13/2013 0945   CO2 27 05/11/2013 0940   BUN 13.3 09/13/2013 0945   BUN 14 05/11/2013 0940   CREATININE 0.7 09/13/2013 0945   CREATININE 0.60 05/11/2013 0940      Component Value Date/Time   CALCIUM 8.9 09/13/2013 0945   CALCIUM 9.2 05/11/2013 0940   ALKPHOS 160* 09/13/2013 0945   ALKPHOS 69 04/08/2013 0942   AST 18 09/13/2013 0945   AST 22 04/08/2013 0942   ALT 16 09/13/2013 0945   ALT 20 04/08/2013 0942   BILITOT 0.43 09/13/2013 0945   BILITOT 0.6 04/08/2013 0942     ADDITIONAL INFORMATION: PROGNOSTIC INDICATORS - ACIS Results: IMMUNOHISTOCHEMICAL AND MORPHOMETRIC ANALYSIS  BY THE AUTOMATED CELLULAR IMAGING SYSTEM (ACIS) Estrogen Receptor: 100%, POSITIVE, STRONG STAINING INTENSITY Progesterone Receptor: 0%, NEGATIVE COMMENT: The negative hormone receptor study in this case has no internal positive control. REFERENCE RANGE ESTROGEN RECEPTOR NEGATIVE <1% POSITIVE =>1% PROGESTERONE RECEPTOR NEGATIVE <1% POSITIVE =>1% All controls stained appropriately Jimmy Picket MD Pathologist, Electronic Signature ( Signed 04/22/2013) CHROMOGENIC IN-SITU HYBRIDIZATION Results: HER-2/NEU BY CISH - NO AMPLIFICATION OF HER-2 DETECTED. RESULT RATIO OF HER2: CEP 17 SIGNALS 0.91 AVERAGE HER2 COPY NUMBER PER CELL 2.15 1 of 4 FINAL for [place], BOCCIO ([id number]) ADDITIONAL INFORMATION:(continued) REFERENCE RANGE NEGATIVE HER2/Chr17 Ratio <2.0 and Average HER2 copy number <4.0 EQUIVOCAL HER2/Chr17 Ratio <2.0 and Average HER2 copy number 4.0 and <6.0 POSITIVE HER2/Chr17 Ratio >=2.0 and/or Average HER2 copy number >=6.0 Jimmy Picket MD Pathologist, Electronic Signature ( Signed 04/22/2013) FINAL DIAGNOSIS Diagnosis Breast, modified radical mastectomy , Right and axillary contents - RESIDUAL INVASIVE DUCAL CARCINOMA, NOTTINGHAM COMBINED HISTOLOGIC GRADE II, 4.3 CM - INTERMEDIATE GRADE DUCTAL CARCINOMA IN SITU. - ANGIOLYMPHATIC INVASION PRESENT. - EXTENSIVE STROMAL FIBROSIS, CONSISTENT WITH POST-TREATMENT EFFECT. - FIVE OF TWENTY-THREE LYMPH NODES, POSITIVE FOR METASTATIC CARCINOMA (5/23). - RESECTION MARGINS, NEGATIVE FOR ATYPIA OR MALIGNANCY. - PLEASE SEE ONCOLOGY TEMPLATE FOR DETAILS. Microscopic Comment BREAST, INVASIVE TUMOR, WITH LYMPH NODE SAMPLING Specimen, including laterality: Right breast and axillary lymph nodes Procedure: Modified radical mastectomy with lymph node dissection Grade: II Tubule formation: 2 Nuclear pleomorphism: 2 Mitotic: 1 Tumor size (gross measurement): 4.3 cm (clusters of tumor cells contiguously spread throughout the sections;  dense stromal fibrosis in the background, consistent with post-treatment effect) Margins: Negative Invasive, distance to closest margin: Deep margin: more than 1 cm In-situ, distance to closest margin: Deep margin: more then 1 cm Lymphovascular invasion: Present Ductal carcinoma in situ: Present Grade: Intermediate grade Extensive intraductal component: No Lobular neoplasia: N/A Tumor focality: Unifocal Treatment effect: Present, only in the breast tissue Extent of tumor: Skin: No Nipple: No Skeletal muscle: N/A Lymph nodes: # examined: 23 Lymph nodes with metastasis: 5 2 of 4 FINAL for MORNA, FLUD ([id number]) Microscopic Comment(continued) Isolated tumor cells (< 0.2 mm): 0 Micrometastasis: (> 0.2 mm and < 2.0 mm): 0 Macrometastasis: (> 2.0 mm): 5 Extracapsular extension: No Breast prognostic profile: Please correlate with previous case [phone] Estrogen receptor: 100%, positive, strong staining intensity. Progesterone  receptor: 96%, positive, strong staining intensity. Her 2 neu: No amplification, the ratio of Her 2:CEP 17 signals was 0.86 Ki-67: 66%; The breast prognostic profile will be repeated and an addendum report will follow. Non-neoplastic breast: Dense stromal fibrosis, consistent with post-treatment effect. TNM: ypT2, ypN2a Comments: A breast prognostic profile will be repeated and an addendum report will follow. Sections show invasive ductal carcinoma with micropapillary lobular features. Immunohistochemical stain for E-Cadherin was performed an the tumor cells are strongly positive for E-Cadherin. The tumor is contiguously spreading throughout the sections with associated fibrotic stroma in the background; the morphologic features are mostly consistent with post-treatment effect. (HCL:kh 04-18-13) Abigail Miyamoto MD Pathologist, Electronic Signature (Case signed 04/20/2013) Specimen Gross and Clinical Information   RADIOGRAPHIC STUDIES:  No results  found.  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 The tumor was ER positive PR negative HER-2/neu negative with an elevated Ki-67 of 66%. She received neoadjuvant antiestrogen therapy consisting of letrozole 2.5 mg. She had an Oncotype DX testing performed on her biopsy tissue that put her in the intermediate risk category.   #2 patient is now status post right modified radical mastectomy with axillary lymph node dissection. She does have significant residual disease measuring 4.3 cm which is ER positive PR negative HER-2/neu negative. 5 of 23 lymph nodes were positive for metastatic disease.   #3 patient with prior history of left breast cancer which was DCIS she underwent a lumpectomy followed by radiation.  #4 Patient was recommended chemotherapy consisting of CMF to be given every 3 weeks. We cannot use other agents do to her comorbidities and the use of steroids. Corticosteroid usage is contraindicated in patient due to her primary eye issues.  S/P CMF x cycle 6 completed 09/06/13.  PLAN:  #1 patient has now completed 6 cycles of CMF. Overall she tolerated it well.  #2 she is gong to proceed with radiation therapy.  #3 I will plan on seeing her back in January 2015 to begin antiestrogen therapy with an aromatase inhibitor.   I spent 25 minutes counseling the patient face to face.  The total time spent in the appointment was 30 minutes.    Drue Second, MD Medical/Oncology [facility] [phone] (beeper) [phone] (Office)  09/13/2013, 11:44 AM

## 2013-09-14 NOTE — Progress Notes (Signed)
Department of Radiation Oncology  Phone:  [phone] Fax:        [phone]   Name: Mary Young MRN: [id number]  DOB: [birth year]  Date: 09/02/2013  Follow Up Visit Note  Diagnosis: ypT2N2 Invasive right breast cancer (sp neoadjuvant femara, mastectomy, and adjuvant chemotherapy)   Interval History: Mary Young presents today for routine followup.  He has completed her adjuvant chemotherapy with CMF. She is ready to proceed on with Cytoxan plus 5-FU plus radiation. She is feeling well. She has some mild nausea and fatigue. Other than that really no pain or neuropathy. She recovered well from her mastectomy and lymph node dissection. She has no arm related complaints. He is accompanied by her husband today.  Allergies:  Allergies  Allergen Reactions  . Vicodin [Hydrocodone-Acetaminophen] Nausea And Vomiting  . Percocet [Oxycodone-Acetaminophen] Nausea And Vomiting  . Precipitated Sulfur [Sulfur] Itching    Eye medication  . Timolol Rash    Eye medication with preservatives    Medications:  Current Outpatient Prescriptions  Medication Sig Dispense Refill  . Ascorbic Acid (VITAMIN C PO) Take by mouth.      Marland Kitchen aspirin EC 81 MG tablet Take 81 mg by mouth 2 (two) times daily.       . B Complex-C (SUPER B COMPLEX PO) Take 1 tablet by mouth daily.       . Calcium-Vitamin D-Vitamin K 500-100-40 MG-UNT-MCG CHEW Chew 2 tablets by mouth daily.       . Chlorpheniramine Maleate (CHLOR-TRIMETON PO) Take 1 tablet by mouth as needed.      . dorzolamide (TRUSOPT) 2 % ophthalmic solution Place 1 drop into both eyes 2 (two) times daily.       . fish oil-omega-3 fatty acids 1000 MG capsule Take 2 g by mouth daily.      Marland Kitchen lidocaine-prilocaine (EMLA) cream Apply topically as needed.  30 g  0  . losartan (COZAAR) 50 MG tablet Take 50 mg by mouth daily.      . Multiple Vitamin (MULTIVITAMIN WITH MINERALS) TABS Take 1 tablet by mouth daily.      . simvastatin (ZOCOR) 20 MG tablet Take 20 mg by  mouth every other day.       . timolol (TIMOPTIC) 0.5 % ophthalmic solution Place 1 drop into both eyes 2 (two) times daily. Non preservative      . TRAVATAN Z 0.004 % SOLN ophthalmic solution Place 1 drop into both eyes at bedtime.       Marland Kitchen UNABLE TO FIND Rx: L8000- Post Surgical Bras (Quantity: 6) L8030- Silicone Breast Prosthesis (Quantity: 1) Dx: 174.9; Right mastectomy  1 each  0  . vitamin E 400 UNIT capsule Take 400 Units by mouth daily.       No current facility-administered medications for this visit.   Facility-Administered Medications Ordered in Other Visits  Medication Dose Route Frequency Provider Last Rate Last Dose  . 0.9 %  sodium chloride infusion   Intravenous Once Illa Level, NP        Physical Exam:  There were no vitals filed for this visit. she is a pleasant female in no distress sitting comfortably examining table. She has a well-healed right mastectomy scar. She has no lymphedema of her right arm. She is alert and oriented x3. ECoG performance status 0.  IMPRESSION: Mary Young is a 72 y.o. female status post date neoadjuvant antiestrogen therapy followed by mastectomy and axillary lymph node dissection followed by adjuvant chemotherapy  PLAN:  I  spoke to [employer] and her husband today regarding adjuvant radiation. We discussed the results of randomized trial showing a benefit in patients who undergo postmastectomy radiation for positive lymph nodes. We discussed the decrease in local failures in the improvement in survival seen in these trials. Because she had neoadjuvant hormonal therapy this sort of muddies the waters as to what her true positive lymph node status is. For this reason I will treat her axilla and supraclavicular fossa as well as her chest wall. We have scheduled her for simulation. We discussed the possible side effects of treatment including but not limited to skin redness and fatigue. We discussed possible competitions with reconstruction. We  discussed a symptomatic lung damage. He has signed informed consent and agree to proceed forward.    Lurline Hare, MD

## 2013-09-16 ENCOUNTER — Telehealth: Payer: Self-pay | Admitting: Oncology

## 2013-09-16 NOTE — Telephone Encounter (Signed)
LVMM making pt aware of 11/29/13 appt w KK per 10/28 POF Cal also mailed [facility]

## 2013-09-20 ENCOUNTER — Ambulatory Visit
Admission: RE | Admit: 2013-09-20 | Discharge: 2013-09-20 | Disposition: A | Discharge status: Home / Self Care | Payer: Medicare Other | Source: Ambulatory Visit | Attending: Radiation Oncology | Admitting: Radiation Oncology

## 2013-09-20 DIAGNOSIS — C50411 Malignant neoplasm of upper-outer quadrant of right female breast: Secondary | ICD-10-CM

## 2013-09-20 NOTE — Progress Notes (Signed)
  Radiation Oncology         [phone] ________________________________  Name: Mary Young MRN: [id number]  Date: 09/20/2013  DOB: [birth year]  Simulation Verification Note  Status: outpatient  NARRATIVE: The patient was brought to the treatment unit and placed in the planned treatment position. The clinical setup was verified. Then port films were obtained and uploaded to the radiation oncology medical record software.  The treatment beams were carefully compared against the planned radiation fields. The position location and shape of the radiation fields was reviewed. The targeted volume of tissue appears appropriately covered by the radiation beams. Organs at risk appear to be excluded as planned.  Based on my personal review, I approved the simulation verification. The patient's treatment will proceed as planned.  ------------------------------------------------  Lurline Hare, MD

## 2013-09-21 ENCOUNTER — Ambulatory Visit
Admission: RE | Admit: 2013-09-21 | Discharge: 2013-09-21 | Disposition: A | Discharge status: Home / Self Care | Payer: Medicare Other | Source: Ambulatory Visit | Attending: Radiation Oncology | Admitting: Radiation Oncology

## 2013-09-21 ENCOUNTER — Telehealth (INDEPENDENT_AMBULATORY_CARE_PROVIDER_SITE_OTHER): Payer: Self-pay | Admitting: General Surgery

## 2013-09-21 DIAGNOSIS — C50411 Malignant neoplasm of upper-outer quadrant of right female breast: Secondary | ICD-10-CM

## 2013-09-21 MED ORDER — RADIAPLEXRX EX GEL
Freq: Once | CUTANEOUS | Status: AC
  Administered 2013-09-21: 18:00:00 via TOPICAL

## 2013-09-21 MED ORDER — ALRA NON-METALLIC DEODORANT (RAD-ONC)
1.0000 "application " | Freq: Once | TOPICAL | Status: AC
  Administered 2013-09-21: 1 via TOPICAL

## 2013-09-21 NOTE — Telephone Encounter (Signed)
Called patient this morning to ask her what she would like to do about the removal of her port a cath. And she stated that she would like to do it in the office and do it in the morning time. I told her that I will let Dr Derrell Lolling know and Huntley Dec know also. I told the patient that Huntley Dec maybe the one calling her back when she will need to come in to have the surgery done, and the patient agree to that

## 2013-09-22 ENCOUNTER — Ambulatory Visit
Admission: RE | Admit: 2013-09-22 | Discharge: 2013-09-22 | Disposition: A | Discharge status: Home / Self Care | Payer: Medicare Other | Source: Ambulatory Visit | Attending: Radiation Oncology | Admitting: Radiation Oncology

## 2013-09-22 DIAGNOSIS — C50411 Malignant neoplasm of upper-outer quadrant of right female breast: Secondary | ICD-10-CM

## 2013-09-22 NOTE — Progress Notes (Signed)
  Radiation Oncology         [phone] ________________________________  Name: Mary Young MRN: [id number]  Date: 09/22/2013  DOB: May 14, 1941  Simulation Verification Note  Status: outpatient  NARRATIVE: The patient was brought to the treatment unit and placed in the planned treatment position. The clinical setup was verified. Then port films were obtained and uploaded to the radiation oncology medical record software.  The treatment beams were carefully compared against the planned radiation fields. The position location and shape of the radiation fields was reviewed. They targeted volume of tissue appears to be appropriately covered by the radiation beams. Organs at risk appear to be excluded as planned.  Based on my personal review, I approved the simulation verification. The patient's treatment will proceed as planned.  -----------------------------------  Billie Lade, PhD, MD

## 2013-09-22 NOTE — Progress Notes (Signed)
Routine of clinic reviewed.Side effects of treatment and how to take care of self reviewed to include skin care,tenderness and swelling and fatigue.given alra deodorant and radiaplex.

## 2013-09-23 ENCOUNTER — Ambulatory Visit
Admission: RE | Admit: 2013-09-23 | Discharge: 2013-09-23 | Disposition: A | Discharge status: Home / Self Care | Payer: Medicare Other | Source: Ambulatory Visit | Attending: Radiation Oncology | Admitting: Radiation Oncology

## 2013-09-23 NOTE — Telephone Encounter (Signed)
I called the pt and scheduled her to come in 11/20 at 0845 for port removal in the office.

## 2013-09-26 ENCOUNTER — Ambulatory Visit
Admission: RE | Admit: 2013-09-26 | Discharge: 2013-09-26 | Disposition: A | Discharge status: Home / Self Care | Payer: Medicare Other | Source: Ambulatory Visit | Attending: Radiation Oncology | Admitting: Radiation Oncology

## 2013-09-27 ENCOUNTER — Ambulatory Visit
Admission: RE | Admit: 2013-09-27 | Discharge: 2013-09-27 | Disposition: A | Discharge status: Home / Self Care | Payer: Medicare Other | Source: Ambulatory Visit | Attending: Radiation Oncology | Admitting: Radiation Oncology

## 2013-09-27 ENCOUNTER — Encounter: Payer: Self-pay | Admitting: Radiation Oncology

## 2013-09-27 VITALS — BP 147/59 | HR 65 | Resp 16 | Wt 131.4 lb

## 2013-09-27 DIAGNOSIS — C50411 Malignant neoplasm of upper-outer quadrant of right female breast: Secondary | ICD-10-CM

## 2013-09-27 NOTE — Progress Notes (Signed)
Weekly Management Note Current Dose:  9 Gy  Projected Dose:60.4  Gy   Narrative:  The patient presents for routine under treatment assessment.  CBCT/MVCT images/Port film x-rays were reviewed.  The chart was checked. Doing well. Right arm and shoulder sore. "Bumps" on chest wall   Physical Findings: Weight: 131 lb 6.4 oz (59.603 kg). Unchanged. Bumps correspond to ribs.   Impression:  The patient is tolerating radiation.  Plan:  Continue treatment as planned. Arm sore ness relieved by tylenol. Likely due to positioning for RT.

## 2013-09-27 NOTE — Progress Notes (Signed)
Patient reports using radiaplex bid as directed. Denies skin changes at this time. Denies fatigue. Patient reports intermittent right scapula pain that radiates down her right upper arm since the weekend. No edema of upper extremities noted.

## 2013-09-28 ENCOUNTER — Ambulatory Visit
Admission: RE | Admit: 2013-09-28 | Discharge: 2013-09-28 | Disposition: A | Discharge status: Home / Self Care | Payer: Medicare Other | Source: Ambulatory Visit | Attending: Radiation Oncology | Admitting: Radiation Oncology

## 2013-09-29 ENCOUNTER — Ambulatory Visit
Admission: RE | Admit: 2013-09-29 | Discharge: 2013-09-29 | Disposition: A | Discharge status: Home / Self Care | Payer: Medicare Other | Source: Ambulatory Visit | Attending: Radiation Oncology | Admitting: Radiation Oncology

## 2013-09-30 ENCOUNTER — Ambulatory Visit
Admission: RE | Admit: 2013-09-30 | Discharge: 2013-09-30 | Disposition: A | Discharge status: Home / Self Care | Payer: Medicare Other | Source: Ambulatory Visit | Attending: Radiation Oncology | Admitting: Radiation Oncology

## 2013-10-03 ENCOUNTER — Ambulatory Visit
Admission: RE | Admit: 2013-10-03 | Discharge: 2013-10-03 | Disposition: A | Discharge status: Home / Self Care | Payer: Medicare Other | Source: Ambulatory Visit | Attending: Radiation Oncology | Admitting: Radiation Oncology

## 2013-10-04 ENCOUNTER — Encounter: Payer: Self-pay | Admitting: Radiation Oncology

## 2013-10-04 ENCOUNTER — Ambulatory Visit
Admission: RE | Admit: 2013-10-04 | Discharge: 2013-10-04 | Disposition: A | Discharge status: Home / Self Care | Payer: Medicare Other | Source: Ambulatory Visit | Attending: Radiation Oncology | Admitting: Radiation Oncology

## 2013-10-04 VITALS — BP 166/75 | HR 72 | Temp 97.7°F | Ht 63.0 in | Wt 132.0 lb

## 2013-10-04 DIAGNOSIS — C50411 Malignant neoplasm of upper-outer quadrant of right female breast: Secondary | ICD-10-CM

## 2013-10-04 NOTE — Progress Notes (Signed)
Weekly Management Note Current Dose: 18  Gy  Projected Dose: 60.4 Gy   Narrative:  The patient presents for routine under treatment assessment.  CBCT/MVCT images/Port film x-rays were reviewed.  The chart was checked. Did not like radiaplex so she is using aquaphor as recommended by her pharmacist.  Physical Findings: Weight: 132 lb (59.875 kg). Slight dermatitis medially.   Impression:  The patient is tolerating radiation.  Plan:  Continue treatment as planned.

## 2013-10-04 NOTE — Progress Notes (Signed)
Mary Young has received 10 fractions to her right breast and supraclavicular region.  Note mild erythema in the upper inner portion of there right breast and is using an OTC "hydrating cream from [facility] on her righr breast.  Asked her to please bring this product in for review.

## 2013-10-05 ENCOUNTER — Ambulatory Visit
Admission: RE | Admit: 2013-10-05 | Discharge: 2013-10-05 | Disposition: A | Discharge status: Home / Self Care | Payer: Medicare Other | Source: Ambulatory Visit | Attending: Radiation Oncology | Admitting: Radiation Oncology

## 2013-10-06 ENCOUNTER — Encounter (INDEPENDENT_AMBULATORY_CARE_PROVIDER_SITE_OTHER): Payer: Self-pay | Admitting: General Surgery

## 2013-10-06 ENCOUNTER — Ambulatory Visit
Admission: RE | Admit: 2013-10-06 | Discharge: 2013-10-06 | Disposition: A | Discharge status: Home / Self Care | Payer: Medicare Other | Source: Ambulatory Visit | Attending: Radiation Oncology | Admitting: Radiation Oncology

## 2013-10-06 ENCOUNTER — Ambulatory Visit (INDEPENDENT_AMBULATORY_CARE_PROVIDER_SITE_OTHER): Payer: Medicare Other | Admitting: General Surgery

## 2013-10-06 DIAGNOSIS — Z95828 Presence of other vascular implants and grafts: Secondary | ICD-10-CM

## 2013-10-06 DIAGNOSIS — Z452 Encounter for adjustment and management of vascular access device: Secondary | ICD-10-CM

## 2013-10-06 DIAGNOSIS — C50411 Malignant neoplasm of upper-outer quadrant of right female breast: Secondary | ICD-10-CM

## 2013-10-06 NOTE — Patient Instructions (Signed)
We were able to remove your port without any difficulty today.  The clear plastic Super Glue will wear off in about 3 weeks  you may take a shower starting tomorrow  If everything heals up okay, then your next visit with Dr. Derrell Lolling will be in May of 2015 after you get your mammograms.  Call sooner if there are any problems with wound healing.

## 2013-10-06 NOTE — Progress Notes (Signed)
Patient ID: Mary Young, female   DOB: Dec 16, 1940, 72 y.o.   MRN: [id number] History: This patient initially presented 7 months ago with a large tumor in her right breast, upper outer quadrant. Imaging studies showed a 5.2 cm tumor and a 2.0 cm right axillary lymph node. 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  She has a past history of left partial mastectomy and sentinel node biopsy and radiation therapy in [facility]. His tumor might have been microinvasive, node negative disease. She has no evidence of recurrence there.  She completed a course of neoadjuvant letrozole. . MRI performed on 03/22/2013 showed minimal response of her right breast cancer, still 4.5 cm. .  On 04/15/2013 she underwent right modified radical mastectomy. She is recovered uneventfully. A small seroma has resolved. She is currently receiving chest wall radiation therapy. I placed a Port-A-Cath on 05/13/2013. She has completed chemotherapy and has done well and requests port removal.     Exam: patient is alert, friendly, no distress.  Right mastectomy wound is healing uneventfully. There is no infection. There is no evidence of fluid.  Range of motion right shoulder is excellent. No arm swelling  Procedure note: Left infraclavicular area prepped and draped with Betadine. 1% Xylocaine with epinephrine local anesthetic. Incision reopened over port. Port dissected away from capsule, sutures removed, port and Catheter were removed Intact, No Bleeding. Deeper Tissues Closed with 0 Vicryl and skin closed with subcuticular sutures 4-0 Monocryl and Dermabond. Tolerated Well.   Assessment: Locally advanced, invasive mammary carcinoma right breast, central and upper outer quadrant, receptor positive, HER-2-negative, pretreatment clinical stage T3,N1; Final stage ypT2, ypN2.  Recovering uneventfully  following right modified radical mastectomy  Status post Port-A-Cath insertion, no apparent complications    Plan:  Port-A-Cath removed under local anesthesia in office today.Uneventful. Continue chest wall radiation therapy Next mammogram May, 2015 And see me at that time. Return sooner if any wound healing problems.    Angelia Mould. Derrell Lolling, M.D., [facility] General and Minimally invasive Surgery Breast and Colorectal Surgery Office:   [phone] Pager:   [phone]

## 2013-10-07 ENCOUNTER — Ambulatory Visit
Admission: RE | Admit: 2013-10-07 | Discharge: 2013-10-07 | Disposition: A | Discharge status: Home / Self Care | Payer: Medicare Other | Source: Ambulatory Visit | Attending: Radiation Oncology | Admitting: Radiation Oncology

## 2013-10-07 VITALS — BP 142/71 | HR 78 | Temp 97.4°F | Ht 63.0 in | Wt 130.7 lb

## 2013-10-07 DIAGNOSIS — C50411 Malignant neoplasm of upper-outer quadrant of right female breast: Secondary | ICD-10-CM

## 2013-10-07 NOTE — Progress Notes (Signed)
Ms. Mary Young reports that since the start of radiation sh has noted redness in the left upper chest area, even though, her treatment field is the right breast. On yesterday after her radiation therapy treatment she had her port-a-cath removed and  had increasing redness in the left chest area with tenderness.  She will be assessed by Dr. Michell Heinrich prior to today's treatment.

## 2013-10-07 NOTE — Progress Notes (Signed)
Weekly Management Note Current Dose:   23.4Gy  Projected Dose: 60.4 Gy   Narrative:  The patient presents for routine under treatment assessment.  CBCT/MVCT images/Port film x-rays were reviewed.  The chart was checked. Doing well. Had concerns about redness over her left chest wall after portacath was removed yesterday. Though perhaps radiation was going to her left breast.   Physical Findings: Weight: 130 lb 11.2 oz (59.285 kg). Rash from collar bone to top of left breast over PAC incision site.   Impression:  The patient is tolerating radiation.  Plan:  Continue treatment as planned. Reviewed plan, beams eye view, etc. Likely reaction to prep, towel or lidocaine.

## 2013-10-09 ENCOUNTER — Ambulatory Visit
Admission: RE | Admit: 2013-10-09 | Discharge: 2013-10-09 | Disposition: A | Discharge status: Home / Self Care | Payer: Medicare Other | Source: Ambulatory Visit | Attending: Radiation Oncology | Admitting: Radiation Oncology

## 2013-10-09 DIAGNOSIS — C50411 Malignant neoplasm of upper-outer quadrant of right female breast: Secondary | ICD-10-CM

## 2013-10-09 NOTE — Progress Notes (Signed)
Pt denies pain, fatigue, loss of appetite. She is applying cream that was approved by Dr Michell Heinrich to treatment area of right chest wall. 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

## 2013-10-09 NOTE — Progress Notes (Signed)
[facility]    Radiation Oncology [address] [place]     Maryln Gottron, M.D. [place], [place] [address]               Billie Lade, M.D., Ph.D. Phone: [phone]      Molli Hazard A. Kathrynn Running, M.D. Fax: [phone]      Radene Gunning, M.D., Ph.D.         Lurline Hare, M.D.         Grayland Jack, M.D Weekly Treatment Management Note  Name: Mary Young     MRN: [id number]        CSN: [id number] Date: 10/09/2013      DOB: 1940-12-19  CC: Astrid Divine, MD         Valentina Lucks    Status: Outpatient  Diagnosis: The encounter diagnosis was Cancer of upper-outer quadrant of female breast, right.  Current Dose: 25.2 Gy  Current Fraction: 14  Planned Dose: 60.4 Gy  Narrative: Mary Young was seen today for weekly treatment management. The chart was checked and port films  were reviewed. She is tolerating the treatments well at this time. She has noticed some mild itching and discomfort along the right chest wall area. She did have her Port-A-Cath removed from the left upper chest recently has had some discomfort in this region.  Vicodin; Percocet; Precipitated sulfur; and Timolol Current Outpatient Prescriptions  Medication Sig Dispense Refill  . Ascorbic Acid (VITAMIN C PO) Take by mouth.      Marland Kitchen aspirin EC 81 MG tablet Take 81 mg by mouth 2 (two) times daily.       . B Complex-C (SUPER B COMPLEX PO) Take 1 tablet by mouth daily.       . Calcium-Vitamin D-Vitamin K 500-100-40 MG-UNT-MCG CHEW Chew 2 tablets by mouth daily.       . Chlorpheniramine Maleate (CHLOR-TRIMETON PO) Take 1 tablet by mouth as needed.      . dorzolamide (TRUSOPT) 2 % ophthalmic solution Place 1 drop into both eyes 2 (two) times daily.       . fish oil-omega-3 fatty acids 1000 MG capsule Take 2 g by mouth daily.      Marland Kitchen losartan (COZAAR) 50 MG tablet Take 50 mg by mouth daily.      . Multiple Vitamin (MULTIVITAMIN WITH MINERALS) TABS Take 1 tablet by mouth daily.      .  simvastatin (ZOCOR) 20 MG tablet Take 20 mg by mouth every other day.       . timolol (TIMOPTIC) 0.5 % ophthalmic solution Place 1 drop into both eyes 2 (two) times daily. Non preservative      . TRAVATAN Z 0.004 % SOLN ophthalmic solution Place 1 drop into both eyes at bedtime.       Marland Kitchen UNABLE TO FIND Rx: L8000- Post Surgical Bras (Quantity: 6) L8030- Silicone Breast Prosthesis (Quantity: 1) Dx: 174.9; Right mastectomy  1 each  0  . vitamin E 400 UNIT capsule Take 400 Units by mouth daily.       No current facility-administered medications for this encounter.   Facility-Administered Medications Ordered in Other Encounters  Medication Dose Route Frequency Provider Last Rate Last Dose  . 0.9 %  sodium chloride infusion   Intravenous Once Illa Level, NP        Physical Examination:  There were no vitals filed for this visit.  Wt Readings from Last 3 Encounters:  10/07/13 130 lb 11.2 oz (  59.285 kg)  10/04/13 132 lb (59.875 kg)  09/27/13 131 lb 6.4 oz (59.603 kg)    The right chest wall area shows radiation dermatitis in the upper-inner aspect but no skin breakdown is noted. Patient's Port-A-Cath site looks healthy without signs of infection. Lungs - Normal respiratory effort, chest expands symmetrically. Lungs are clear to auscultation, no crackles or wheezes.  Heart has regular rhythm and rate  Abdomen is soft and non tender with normal bowel sounds  Assessment:  Patient tolerating treatments well  Plan: Continue treatment per original radiation prescription.

## 2013-10-10 ENCOUNTER — Ambulatory Visit
Admission: RE | Admit: 2013-10-10 | Discharge: 2013-10-10 | Disposition: A | Discharge status: Home / Self Care | Payer: Medicare Other | Source: Ambulatory Visit | Attending: Radiation Oncology | Admitting: Radiation Oncology

## 2013-10-11 ENCOUNTER — Ambulatory Visit
Admission: RE | Admit: 2013-10-11 | Discharge: 2013-10-11 | Disposition: A | Discharge status: Home / Self Care | Payer: Medicare Other | Source: Ambulatory Visit | Attending: Radiation Oncology | Admitting: Radiation Oncology

## 2013-10-11 VITALS — BP 124/66 | HR 84 | Temp 98.0°F | Wt 127.9 lb

## 2013-10-11 DIAGNOSIS — C50411 Malignant neoplasm of upper-outer quadrant of right female breast: Secondary | ICD-10-CM

## 2013-10-11 NOTE — Progress Notes (Signed)
Patient here for routine weekly assessment of right breast radiation.Completed 16 of 25.Has red rash over right chest no longer itching.Denies pain or fatigue.

## 2013-10-11 NOTE — Progress Notes (Signed)
Weekly Management Note Current Dose: 14.4  Gy  Projected Dose: 60.4 Gy   Narrative:  The patient presents for routine under treatment assessment.  CBCT/MVCT images/Port film x-rays were reviewed.  The chart was checked. Started using radiaplex in upper inner quadrant for dermatitis.   Physical Findings: Weight: 127 lb 14.4 oz (58.015 kg). Dermatitis medially.   Impression:  The patient is tolerating radiation.  Plan:  Continue treatment as planned. Continue radiaplex. Add hydrocortisone prn.

## 2013-10-12 ENCOUNTER — Ambulatory Visit
Admission: RE | Admit: 2013-10-12 | Discharge: 2013-10-12 | Disposition: A | Discharge status: Home / Self Care | Payer: Medicare Other | Source: Ambulatory Visit | Attending: Radiation Oncology | Admitting: Radiation Oncology

## 2013-10-17 ENCOUNTER — Ambulatory Visit
Admission: RE | Admit: 2013-10-17 | Discharge: 2013-10-17 | Disposition: A | Discharge status: Home / Self Care | Payer: Medicare Other | Source: Ambulatory Visit | Attending: Radiation Oncology | Admitting: Radiation Oncology

## 2013-10-18 ENCOUNTER — Encounter: Payer: Self-pay | Admitting: Radiation Oncology

## 2013-10-18 ENCOUNTER — Ambulatory Visit
Admission: RE | Admit: 2013-10-18 | Discharge: 2013-10-18 | Disposition: A | Discharge status: Home / Self Care | Payer: Medicare Other | Source: Ambulatory Visit | Admitting: Radiation Oncology

## 2013-10-18 ENCOUNTER — Telehealth: Payer: Self-pay | Admitting: Oncology

## 2013-10-18 ENCOUNTER — Ambulatory Visit
Admission: RE | Admit: 2013-10-18 | Discharge: 2013-10-18 | Disposition: A | Discharge status: Home / Self Care | Payer: Medicare Other | Source: Ambulatory Visit | Attending: Radiation Oncology | Admitting: Radiation Oncology

## 2013-10-18 ENCOUNTER — Ambulatory Visit (INDEPENDENT_AMBULATORY_CARE_PROVIDER_SITE_OTHER): Payer: Medicare Other | Admitting: General Surgery

## 2013-10-18 DIAGNOSIS — C50911 Malignant neoplasm of unspecified site of right female breast: Secondary | ICD-10-CM

## 2013-10-18 NOTE — Progress Notes (Unsigned)
Weekly Management Note Current Dose:  34.2 Gy  Projected Dose: 60.4 Gy   Narrative:  The patient presents for routine under treatment assessment.  CBCT/MVCT images/Port film x-rays were reviewed.  The chart was checked. Back to using radiaplex and aquaphor. Using dold towels to skin during the day Skin is irritated medially. Seen on treatment machine for electron mark out.   Physical Findings: Dermatitis medially.   Impression:  The patient is tolerating radiation.  Plan:  Continue treatment as planned. Encouraged her to switch to biafene and add hydrogel pads.

## 2013-10-19 ENCOUNTER — Ambulatory Visit
Admission: RE | Admit: 2013-10-19 | Discharge: 2013-10-19 | Disposition: A | Discharge status: Home / Self Care | Payer: Medicare Other | Source: Ambulatory Visit | Attending: Radiation Oncology | Admitting: Radiation Oncology

## 2013-10-19 MED ORDER — BIAFINE EX EMUL
CUTANEOUS | Status: DC | PRN
  Administered 2013-10-19: 1 via TOPICAL

## 2013-10-20 ENCOUNTER — Ambulatory Visit
Admission: RE | Admit: 2013-10-20 | Discharge: 2013-10-20 | Disposition: A | Discharge status: Home / Self Care | Payer: Medicare Other | Source: Ambulatory Visit | Attending: Radiation Oncology | Admitting: Radiation Oncology

## 2013-10-21 ENCOUNTER — Ambulatory Visit
Admission: RE | Admit: 2013-10-21 | Discharge: 2013-10-21 | Disposition: A | Discharge status: Home / Self Care | Payer: Medicare Other | Source: Ambulatory Visit | Attending: Radiation Oncology | Admitting: Radiation Oncology

## 2013-10-21 ENCOUNTER — Ambulatory Visit: Payer: Medicare Other

## 2013-10-22 ENCOUNTER — Ambulatory Visit: Payer: Medicare Other

## 2013-10-24 ENCOUNTER — Ambulatory Visit
Admission: RE | Admit: 2013-10-24 | Discharge: 2013-10-24 | Disposition: A | Discharge status: Home / Self Care | Payer: Medicare Other | Source: Ambulatory Visit | Attending: Radiation Oncology | Admitting: Radiation Oncology

## 2013-10-24 DIAGNOSIS — Z853 Personal history of malignant neoplasm of breast: Secondary | ICD-10-CM

## 2013-10-25 ENCOUNTER — Ambulatory Visit
Admission: RE | Admit: 2013-10-25 | Discharge: 2013-10-25 | Disposition: A | Discharge status: Home / Self Care | Payer: Medicare Other | Source: Ambulatory Visit | Attending: Radiation Oncology | Admitting: Radiation Oncology

## 2013-10-26 ENCOUNTER — Encounter: Payer: Self-pay | Admitting: Radiation Oncology

## 2013-10-26 ENCOUNTER — Ambulatory Visit
Admission: RE | Admit: 2013-10-26 | Discharge: 2013-10-26 | Disposition: A | Discharge status: Home / Self Care | Payer: Medicare Other | Source: Ambulatory Visit | Attending: Radiation Oncology | Admitting: Radiation Oncology

## 2013-10-26 VITALS — BP 139/79 | HR 72 | Temp 97.5°F | Resp 20 | Wt 129.9 lb

## 2013-10-26 DIAGNOSIS — C50911 Malignant neoplasm of unspecified site of right female breast: Secondary | ICD-10-CM

## 2013-10-26 NOTE — Progress Notes (Signed)
Weekly Management Note Current Dose: 45  Gy  Projected Dose: 60.4 Gy   Narrative:  The patient presents for routine under treatment assessment.  CBCT/MVCT images/Port film x-rays were reviewed.  The chart was checked. Doing well. Using radiaplex. Would like to finish on Dec 19th.  Some fatigue  Physical Findings: Weight: 129 lb 14.4 oz (58.922 kg). Skin irritation on the right chest wall. Worse medially. No desquamation.  Impression:  The patient is tolerating radiation.  Plan:  Continue treatment as planned. May need to double up on her boost next week to finish in time. Continue radiaplex.

## 2013-10-26 NOTE — Progress Notes (Signed)
Pt reports "burning sensation" of skin in treatment area of right breast. She states Biafine is very helpful. Skin is hyperpigmented, no desquamation noted today. She is fatigued.

## 2013-10-27 ENCOUNTER — Ambulatory Visit
Admission: RE | Admit: 2013-10-27 | Discharge: 2013-10-27 | Disposition: A | Discharge status: Home / Self Care | Payer: Medicare Other | Source: Ambulatory Visit | Attending: Radiation Oncology | Admitting: Radiation Oncology

## 2013-10-28 ENCOUNTER — Ambulatory Visit
Admission: RE | Admit: 2013-10-28 | Discharge: 2013-10-28 | Disposition: A | Discharge status: Home / Self Care | Payer: Medicare Other | Source: Ambulatory Visit | Attending: Radiation Oncology | Admitting: Radiation Oncology

## 2013-10-31 ENCOUNTER — Ambulatory Visit
Admission: RE | Admit: 2013-10-31 | Discharge: 2013-10-31 | Disposition: A | Discharge status: Home / Self Care | Payer: Medicare Other | Source: Ambulatory Visit | Attending: Radiation Oncology | Admitting: Radiation Oncology

## 2013-11-01 ENCOUNTER — Ambulatory Visit: Payer: Medicare Other

## 2013-11-01 ENCOUNTER — Ambulatory Visit
Admission: RE | Admit: 2013-11-01 | Discharge: 2013-11-01 | Disposition: A | Discharge status: Home / Self Care | Payer: Medicare Other | Source: Ambulatory Visit | Attending: Radiation Oncology | Admitting: Radiation Oncology

## 2013-11-01 ENCOUNTER — Ambulatory Visit: Payer: Medicare Other | Admitting: Radiation Oncology

## 2013-11-01 DIAGNOSIS — C50911 Malignant neoplasm of unspecified site of right female breast: Secondary | ICD-10-CM

## 2013-11-01 MED ORDER — BIAFINE EX EMUL
CUTANEOUS | Status: DC | PRN
  Administered 2013-11-01: 18:00:00 via TOPICAL

## 2013-11-01 NOTE — Progress Notes (Signed)
Weekly Management Note Current Dose:  52.4 Gy  Projected Dose: 60.4 Gy   Narrative:  The patient presents for routine under treatment assessment.  CBCT/MVCT images/Port film x-rays were reviewed.  The chart was checked. Doing well. Using radiaplex and aloe. Some skin irritation but not much. Looking forward to being done on Friday.   Physical Findings: Dermatitis over chest wall. Skin intact.   Impression:  The patient is tolerating radiation.  Plan:  Continue treatment as planned. Discussed lotion with vit e after 2 weeks.

## 2013-11-01 NOTE — Progress Notes (Signed)
Seen on Treatment machine by Dr. Michell Heinrich.  As requested by Dr. Michell Heinrich given an additional tube of Biafine to apply BID to treatment field.

## 2013-11-02 ENCOUNTER — Ambulatory Visit: Payer: Medicare Other

## 2013-11-02 ENCOUNTER — Ambulatory Visit: Admission: RE | Admit: 2013-11-02 | Payer: Medicare Other | Source: Ambulatory Visit

## 2013-11-03 ENCOUNTER — Ambulatory Visit
Admission: RE | Admit: 2013-11-03 | Discharge: 2013-11-03 | Disposition: A | Discharge status: Home / Self Care | Payer: Medicare Other | Source: Ambulatory Visit | Attending: Radiation Oncology | Admitting: Radiation Oncology

## 2013-11-04 ENCOUNTER — Encounter: Payer: Self-pay | Admitting: Radiation Oncology

## 2013-11-04 ENCOUNTER — Ambulatory Visit
Admission: RE | Admit: 2013-11-04 | Discharge: 2013-11-04 | Disposition: A | Discharge status: Home / Self Care | Payer: Medicare Other | Source: Ambulatory Visit | Attending: Radiation Oncology | Admitting: Radiation Oncology

## 2013-11-04 ENCOUNTER — Ambulatory Visit: Payer: Medicare Other

## 2013-11-06 NOTE — Progress Notes (Signed)
  Radiation Oncology         [phone]) [phone] ________________________________  Name: Mary Young MRN: [id number]  Date: 11/04/2013  DOB: [birth year]  End of Treatment Note  Diagnosis:   T2N2 Right breast cancer (s/p mastectomy. History of left breast cancer)     Indication for treatment:  Curative       Radiation treatment dates:   09/21/2013-11/04/2013  Site/dose:     Right chest wall/50.4 Wallace Cullens @ 1.8 Wallace Cullens per fraction x 28 fractions Right supraclavicular fossa / 45 Gy @1 .8 Gy per fraction x 25 fractions Right posterior axillary boost/ 4.267 Gy at 0.171 Gy per fraction x 25 fractions Right scar boost / 10 Gray at [employer] per fraction x 5 fractions  Beams/energy:  Opposed Tangents / 6 MV photons LAO / 6 MV photons RPO/ 10 MV photons En face/ 6 MeV electrons  Narrative: The patient tolerated radiation treatment relatively well.   She used a variety of skin care products including aquaphor, radiaplex and aloe.  She had dry desquamation which was expected.   Plan: The patient has completed radiation treatment. The patient will return to radiation oncology clinic for routine followup in one month. I advised them to call or return sooner if they have any questions or concerns related to their recovery or treatment.  ------------------------------------------------  Lurline Hare, MD

## 2013-11-07 ENCOUNTER — Ambulatory Visit: Payer: Medicare Other

## 2013-11-08 ENCOUNTER — Ambulatory Visit: Payer: Medicare Other

## 2013-11-25 ENCOUNTER — Other Ambulatory Visit: Payer: Medicare Other | Admitting: Lab

## 2013-11-25 ENCOUNTER — Ambulatory Visit: Payer: Medicare Other | Admitting: Oncology

## 2013-11-29 ENCOUNTER — Other Ambulatory Visit: Payer: Medicare Other | Admitting: Lab

## 2013-11-29 ENCOUNTER — Other Ambulatory Visit (HOSPITAL_BASED_OUTPATIENT_CLINIC_OR_DEPARTMENT_OTHER): Payer: Medicare Other

## 2013-11-29 ENCOUNTER — Ambulatory Visit (HOSPITAL_BASED_OUTPATIENT_CLINIC_OR_DEPARTMENT_OTHER): Payer: Medicare Other | Admitting: Oncology

## 2013-11-29 ENCOUNTER — Ambulatory Visit: Payer: Medicare Other | Admitting: Oncology

## 2013-11-29 ENCOUNTER — Telehealth: Payer: Self-pay | Admitting: Oncology

## 2013-11-29 ENCOUNTER — Encounter: Payer: Self-pay | Admitting: Oncology

## 2013-11-29 VITALS — BP 132/74 | HR 73 | Temp 98.1°F | Resp 20 | Ht 63.0 in | Wt 130.4 lb

## 2013-11-29 DIAGNOSIS — R5383 Other fatigue: Secondary | ICD-10-CM

## 2013-11-29 DIAGNOSIS — C773 Secondary and unspecified malignant neoplasm of axilla and upper limb lymph nodes: Secondary | ICD-10-CM

## 2013-11-29 DIAGNOSIS — C50911 Malignant neoplasm of unspecified site of right female breast: Secondary | ICD-10-CM

## 2013-11-29 DIAGNOSIS — R5381 Other malaise: Secondary | ICD-10-CM

## 2013-11-29 DIAGNOSIS — C50419 Malignant neoplasm of upper-outer quadrant of unspecified female breast: Secondary | ICD-10-CM

## 2013-11-29 LAB — CBC WITH DIFFERENTIAL/PLATELET
BASO%: 0.8 % (ref 0.0–2.0)
Basophils Absolute: 0 10*3/uL (ref 0.0–0.1)
EOS%: 3.6 % (ref 0.0–7.0)
Eosinophils Absolute: 0.1 10*3/uL (ref 0.0–0.5)
HCT: 38 % (ref 34.8–46.6)
HGB: 13 g/dL (ref 11.6–15.9)
LYMPH%: 13 % — ABNORMAL LOW (ref 14.0–49.7)
MCH: 32.6 pg (ref 25.1–34.0)
MCHC: 34.1 g/dL (ref 31.5–36.0)
MCV: 95.6 fL (ref 79.5–101.0)
MONO#: 0.4 10*3/uL (ref 0.1–0.9)
MONO%: 9.6 % (ref 0.0–14.0)
NEUT#: 3 10*3/uL (ref 1.5–6.5)
NEUT%: 73 % (ref 38.4–76.8)
Platelets: 266 10*3/uL (ref 145–400)
RBC: 3.98 10*6/uL (ref 3.70–5.45)
RDW: 11.5 % (ref 11.2–14.5)
WBC: 4.1 10*3/uL (ref 3.9–10.3)
lymph#: 0.5 10*3/uL — ABNORMAL LOW (ref 0.9–3.3)

## 2013-11-29 LAB — COMPREHENSIVE METABOLIC PANEL (CC13)
ALT: 16 U/L (ref 0–55)
AST: 20 U/L (ref 5–34)
Albumin: 3.4 g/dL — ABNORMAL LOW (ref 3.5–5.0)
Alkaline Phosphatase: 108 U/L (ref 40–150)
Anion Gap: 10 mEq/L (ref 3–11)
BUN: 17.8 mg/dL (ref 7.0–26.0)
CO2: 29 mEq/L (ref 22–29)
Calcium: 9.6 mg/dL (ref 8.4–10.4)
Chloride: 104 mEq/L (ref 98–109)
Creatinine: 0.7 mg/dL (ref 0.6–1.1)
Glucose: 102 mg/dl (ref 70–140)
Potassium: 4.3 mEq/L (ref 3.5–5.1)
Sodium: 143 mEq/L (ref 136–145)
Total Bilirubin: 0.46 mg/dL (ref 0.20–1.20)
Total Protein: 6.8 g/dL (ref 6.4–8.3)

## 2013-11-29 MED ORDER — ANASTROZOLE 1 MG PO TABS: 1.0000 mg | ORAL_TABLET | Freq: Every day | ORAL | Status: AC

## 2013-11-29 NOTE — Telephone Encounter (Signed)
, °

## 2013-11-29 NOTE — Patient Instructions (Signed)
Anastrozole tablets What is this medicine? ANASTROZOLE (an AS troe zole) is used to treat breast cancer in women who have gone through menopause. Some types of breast cancer depend on estrogen to grow, and this medicine can stop tumor growth by blocking estrogen production. This medicine may be used for other purposes; ask your health care provider or pharmacist if you have questions. COMMON BRAND NAME(S): Arimidex What should I tell my health care provider before I take this medicine? They need to know if you have any of these conditions: -liver disease -an unusual or allergic reaction to anastrozole, other medicines, foods, dyes, or preservatives -pregnant or trying to get pregnant -breast-feeding How should I use this medicine? Take this medicine by mouth with a glass of water. Follow the directions on the prescription label. You can take this medicine with or without food. Take your doses at regular intervals. Do not take your medicine more often than directed. Do not stop taking except on the advice of your doctor or health care professional. Talk to your pediatrician regarding the use of this medicine in children. Special care may be needed. Overdosage: If you think you have taken too much of this medicine contact a poison control center or emergency room at once. NOTE: This medicine is only for you. Do not share this medicine with others. What if I miss a dose? If you miss a dose, take it as soon as you can. If it is almost time for your next dose, take only that dose. Do not take double or extra doses. What may interact with this medicine? Do not take this medicine with any of the following medications: -female hormones, like estrogens or progestins and birth control pills This medicine may also interact with the following medications: -tamoxifen This list may not describe all possible interactions. Give your health care provider a list of all the medicines, herbs, non-prescription  drugs, or dietary supplements you use. Also tell them if you smoke, drink alcohol, or use illegal drugs. Some items may interact with your medicine. What should I watch for while using this medicine? Visit your doctor or health care professional for regular checks on your progress. Let your doctor or health care professional know about any unusual vaginal bleeding. Do not treat yourself for diarrhea, nausea, vomiting or other side effects. Ask your doctor or health care professional for advice. What side effects may I notice from receiving this medicine? Side effects that you should report to your doctor or health care professional as soon as possible: -allergic reactions like skin rash, itching or hives, swelling of the face, lips, or tongue -any new or unusual symptoms -breathing problems -chest pain -leg pain or swelling -vomiting Side effects that usually do not require medical attention (report to your doctor or health care professional if they continue or are bothersome): -back or bone pain -cough, or throat infection -diarrhea or constipation -dizziness -headache -hot flashes -loss of appetite -nausea -sweating -weakness and tiredness -weight gain This list may not describe all possible side effects. Call your doctor for medical advice about side effects. You may report side effects to FDA at 1-800-FDA-1088. Where should I keep my medicine? Keep out of the reach of children. Store at room temperature between 20 and 25 degrees C (68 and 77 degrees F). Throw away any unused medicine after the expiration date. NOTE: This sheet is a summary. It may not cover all possible information. If you have questions about this medicine, talk to your doctor, pharmacist,  or health care provider.  2014, [employer]. (2008-01-14 16:31:52)  Breast Cancer Survivor Follow-Up Breast cancer begins when cells in the breast divide too rapidly. The extra cells form a lump (tumor). When the  cancer is treated, the goal is to get rid of all cancer cells. However, sometimes a few cells survive. These cancer cells can then grow. They become recurrent cancer. This means the cancer comes back after treatment.  Most cases of recurrent breast cancer develop 3 to 5 years after treatment. However, sometimes it comes back just a few months after treatment. Other times, it does not come back until years later. If the cancer comes back in the same area as the first breast cancer, it is called a local recurrence. If the cancer comes back somewhere else in the body, it is called regional recurrence if the site is fairly near the breast or distant recurrence if it is far from the breast. Your caregiver may also use the term metastasize to indicate a cancer that has gone to another part of your body. Treatment is still possible after either kind of recurrence. The cancer can still be controlled.  CAUSES OF RECURRENT CANCER No one knows exactly why breast cancer starts in the first place. Why the cancer comes back after treatment is also not clear. It is known that certain conditions, called risk factors, can make this more likely. They include:  Developing breast cancer for the first time before age 30.  Having breast cancer that involves the lymph nodes. These are small, round pieces of tissue found all over the body. Their job is to help fight infections.  Having a large tumor. Cancer is more apt to come back if the first tumor was bigger than 2 inches (5 cm).  Having certain types of breast cancer, such as:  Inflammatory breast cancer. This rare type grows rapidly and causes the breast to become red and swollen.  A high-grade tumor. The grade of a tumor indicates how fast it will grow and spread. High-grade tumors grow more quickly than other types.  HER2 cancer. This refers to the tumor's genetic makeup. Tumors that have this type of gene are more likely to come back after treatment.  Having  close tumor margins. This refers to the space between the tumor and normal, noncancerous cells. If the space is small, the tumor has a greater chance of coming back.  Having treatment involving a surgery to remove the tumor but not the entire breast (lumpectomy) and no radiation therapy. CARE AFTER BREAST CANCER Home Monitoring Women who have had breast cancer should continue to examine their breasts every month. The goal is to catch the cancer quickly if it comes back. Many women find it helpful to do so on the same day each month and to mark the calendar as a reminder. Let your caregiver know immediately if you have any signs of recurrent breast cancer. Symptoms will vary, depending on where the cancer recurs. The original type of treatment can also make a difference. Symptoms of local recurrence after a lumpectomy or a recurrence in the opposite breast may include:  A new lump or thickening in the breast.  A change in the way the skin looks on the breast (such as a rash, dimpling, or wrinkling).  Redness or swelling of the breast.  Changes in the nipple (such as being red, puckered, swollen, or leaking fluid). Symptoms of a recurrence after a breast removal surgery (mastectomy) may include:  A lump or  thickening under the skin.  A thickening around the mastectomy scar. Symptoms of regional recurrence in the lymph nodes near the breast may include:  A lump under the arm or above the collarbone.  Swelling of the arm.  Pain in the arm, shoulder, or chest.  Numbness in the hand or arm. Symptoms of distant recurrence may include:  A cough that does not go away.  Trouble breathing or shortness of breath.  Pain in the bones or the chest. This is pain that lasts or does not respond to rest and medicine.  Headaches.  Sudden vision problems.  Dizziness.  Nausea or vomiting.  Losing weight without trying to.  Persistent abdominal pain.  Changes in bowel movements or blood in  the stool.  Yellowing of the skin or eyes (jaundice).  Blood in the urine or bloody vaginal discharge. Clinical Monitoring  It is helpful to keep a schedule of appointments for needed tests and exams. This includes physical exams, breast exams, exams of the lymph nodes, and general exams.  For the first 3 years after being treated for breast cancer, see your caregiver every 3 to 6 months.  For years 4 and 5 after breast cancer, see your caregiver every 6 to 12 months.  After 5 years, see your caregiver at least once a year.  Regular breast X-rays (mammograms) should continue even if you had a mastectomy.  A mammogram should be done 1 year after the mammogram that first detected breast cancer.  A mammogram should be done every 6 to 12 months after that. Follow your caregiver's advice.  A pelvic exam done by your caregiver checks whether female organs are the normal size and shape. The exam is usually done every year. Ask your caregiver if that schedule is right for you.  Women taking tamoxifen should report any vaginal bleeding immediately to their caregiver. Tamoxifen is often given to women with a certain type of breast cancer. It has been shown to help prevent recurrence.  You will need to decide who your primary caregiver will be.  Most people continue to see their cancer specialist (oncologist) every 3 to 6 months for the first year after cancer treatment.  At some point, you may want to go back to seeing your family caregiver. You would no longer see your oncologist for regular checkups. Many women do this about 1 year after their first diagnosis of breast cancer.  You will still need to be seen every so often by your oncologist. Ask how often that should be. Coordinate this with your family or primary caregiver.  Think about having genetic counseling. This would provide information on traits that can be passed or inherited from one generation to the next. In some cases, breast  cancer runs in families. Tell your caregiver if you:  Are of Ashkenazi Jewish heritage.  Have any family member who has had ovarian cancer.  Have a mother, sister, or daughter who had breast cancer before age 41.  Have 2 or more close relatives who have had breast cancer. This means a mother, sister, daughter, aunt, or grandmother.  Had breast cancer in both breasts.  Have a female relative who has had breast cancer.  Some tests are not recommended for routine screening. Someone recovering from breast cancer does not need to have these tests if there are no problems. The tests have risks, such as radiation exposure, and can be costly. The risks of these tests are thought to be greater than the benefits:  Blood tests.  Chest X-rays.  Bone scans.  Liver ultrasound.  Computed tomography (CT scan).  Positron emission tomography (PET scan).  Magnetic resonance imaging (MRI scan). DIAGNOSIS OF RECURRENT CANCER Recurrent breast cancer may be suspected for various reasons. A mammogram may not look normal. You might feel a lump or have other symptoms. Your caregiver may find something unusual during an exam. To be sure, your caregiver will probably order some tests. The tests are needed because there are symptoms or hints of a problem. They could include:  Blood tests, including a test to check how well the liver is working. The liver is a common site for a distant cancer recurrence.  Imaging tests that create pictures of the inside of the body. These tests include:  Chest X-rays to show if the cancer has come back in the lungs.  CT scans to create detailed pictures of various areas of the body and help find a distant recurrence.  MRI scans to find anything unusual in the breast, chest, or lymph nodes.  Breast ultrasound tests to examine the breasts.  Bone scans to create a picture of your whole skeleton and find cancer in bony areas.  PET scans to create an image of the whole  body. PET scans can be used together with CT scans to show more detail.  Biopsy. A small sample of tissue is taken and checked under a microscope. If cancer cells are found, they may be tested to see if they contain the HER2 gene or the hormones estrogen and progesterone. This will help your caregiver decide how to treat the recurrent cancer. TREATMENT  How recurrent breast cancer is treated depends on where the new cancer is found. The type of treatment that was used for the first breast cancer makes a difference, too. A combination of treatments may be used. Options include:  Surgery.  If the cancer comes back in the breast that was not treated before, you may need a lumpectomy or mastectomy.  If the cancer comes back in the breast that was treated before, you may need a mastectomy.  The lymph nodes under the arm may need to be removed.  Radiation therapy.  For a local recurrence, radiation may be used if it was not used during the first treatment.  For a distance recurrence, radiation is sometimes used.  Chemotherapy.  This may be used before surgery to treat recurrent breast cancer.  This may be used to treat recurrent cancer that cannot be treated with surgery.  This may be used to treat a distant recurrence.  Hormone therapy.  Women with the HER2 gene may be given hormone therapy to attack this gene. Document Released: 07/02/2011 Document Revised: 01/26/2012 Document Reviewed: 07/02/2011 [facility] Patient Information 2014 [place], [place].

## 2013-11-29 NOTE — Progress Notes (Signed)
OFFICE PROGRESS NOTE  CC Dr. Kelton Pillar Dr. Fanny Skates Dr. 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 Patient had a lymph node enlarged there was biopsied and was consistent with metastatic ductal carcinoma. Diagnosed October 2013.  PRIOR THERAPY:  #1 patient underwent a screening mammogram in September 2013 that showed a right breast mass with associated calcifications. On MRI the mass measured 4.7 x 3.6 x 5.2 cm lymph node measuring 1.5 x 1.0 x 2.0 cm. Patient had a needle core biopsy performed that showed invasive mammary carcinoma ER positive PR positive HER-2/neu negative with Ki-67 elevated at 66%. The lymph node was biopsied and was consistent with metastatic ductal carcinoma.  #2 patient was seen in the multidisciplinary breast clinic for discussion of treatment options. She had a Oncotype DX testing performed on the biopsied tissue her recurrence score was 28 giving her 18% risk of distant recurrence with antiestrogen therapy. She was in the intermediate risk category. Patient was enrolled on neoadjuvant antiestrogen treatment clinical trial.  #3 she is status post neoadjuvant letrozole 2.5 mg starting in November 2013 - June 2014.   #4 patient also has prior history of left lumpectomy and radiation for DCIS treated in 2003. This tumor was apparently ER and PR negative so she did not receive adjuvant tamoxifen.  #5 patient is now status post right modified radical mastectomy with axillary lymph node dissection. The final pathology revealed 4.3 cm invasive ductal carcinoma 5 lymph nodes were positive for metastatic disease. Tumor ER +100% PR negative Ki-67 66%. Pathologic staging T2 N2. Patient's case was discussed at the multidisciplinary breast conference. Adjuvant chemotherapy was recommended either doing Taxotere Cytoxan every 3 weeks. However  Dexamtheasone usage, patient may not be a good candidate for this. We therefore discussed the possibility of doing CMF every 21 days for a total of 6 cycles. Once she completes the chemotherapy then she will proceed to postmastectomy radiation therapy.  #6 CMF every 21 days adjuvantly started 05/23/2013-08/30/2013  #7 status post adjuvant radiation therapy to the right chest wall administered by Dr. Thea Silversmith between 09/22/13 - 11/11/13  #8 Begin arimidex 1 milligram daily adjuvantly with curative intent for duration of 5-7 years beginning 11/29/2013  CURRENT THERAPY: Arimidex 1 mg daily to begin 11/29/2013.  INTERVAL HISTORY: Mary Young 73 y.o. female returns for followup visit today. Overall patient is doing well. She tolerated the radiation very nicely to the right chest wall. She is healed very nicely there is no evidence of residual erythema. Although there is darkening of the skin. She denies any nausea vomiting headaches double vision blurring of vision no fevers chills or night sweats no abdominal pain. She has no peripheral paresthesias no myalgias and arthralgias. She does have some fatigue. She is only able to walk about 2-3 miles. She normally would be walking about 5 miles a day. I reassured her that as time goes on she will get her stamina back. We discussed starting her on adjuvant antiestrogen therapy. We discussed the rationale for it. She understands. Remainder of the 10 point review of systems is negative.   MEDICAL HISTORY: Past Medical History  Diagnosis Date  . Breast cancer     left lumpectomy  . Hypertension   . Glaucoma   . Central retinal vein occlusion   . Joint pain   . Dyslipidemia   . H/O echocardiogram 09/27/2009  Note to have some aortic valve sclerosis, mild mitral valve prolapse, mild mitral regurgitation, and mild asymptomatic LVH with an EF>55%  . History of stress test 09/27/2009    Normal Myocardial perfusion study. No significant  ischemia demonstrated, this low risk scan. compared to the previous study.,there is no significant change, Normal myocardial Perfusion imaging. EF>83%  . Seasonal allergies     ALLERGIES:  is allergic to vicodin; percocet; precipitated sulfur; and timolol.  MEDICATIONS:  Current Outpatient Prescriptions  Medication Sig Dispense Refill  . Ascorbic Acid (VITAMIN C PO) Take by mouth.      Marland Kitchen aspirin 325 MG tablet Take 325 mg by mouth 2 (two) times daily as needed.      . Chlorpheniramine Maleate (CHLOR-TRIMETON PO) Take 1 tablet by mouth as needed.      . cholecalciferol (VITAMIN D) 400 UNITS TABS tablet Take 400 Units by mouth 2 (two) times daily.      . dorzolamide (TRUSOPT) 2 % ophthalmic solution Place 1 drop into both eyes 2 (two) times daily.       Marland Kitchen losartan (COZAAR) 50 MG tablet Take 50 mg by mouth daily.      . Multiple Vitamin (MULTIVITAMIN WITH MINERALS) TABS Take 1 tablet by mouth daily.      . simvastatin (ZOCOR) 20 MG tablet Take 20 mg by mouth every other day.       . timolol (TIMOPTIC) 0.5 % ophthalmic solution Place 1 drop into both eyes 2 (two) times daily. Non preservative      . TRAVATAN Z 0.004 % SOLN ophthalmic solution Place 1 drop into both eyes at bedtime.       . vitamin E 400 UNIT capsule Take 400 Units by mouth daily.      Marland Kitchen UNABLE TO FIND Rx: L8000- Post Surgical Bras (Quantity: 6) D2202- Silicone Breast Prosthesis (Quantity: 1) Dx: 174.9; Right mastectomy  1 each  0   No current facility-administered medications for this visit.   Facility-Administered Medications Ordered in Other Visits  Medication Dose Route Frequency Provider Last Rate Last Dose  . 0.9 %  sodium chloride infusion   Intravenous Once [name], NP        SURGICAL HISTORY:  Past Surgical History  Procedure Laterality Date  . Abdominal hysterectomy  1991  . Breast lumpectomy  2003  . Arthroscopy knee w/ drilling  2005/2008    Left knee/Right knee  . Tubal ligation  1983  .  Mastectomy modified radical Right 04/15/2013    Procedure: MASTECTOMY MODIFIED RADICAL;  Surgeon: Adin [name], MD;  Location: [facility];  Service: General;  Laterality: Right;  . Portacath placement Left 05/13/2013    Procedure: INSERTION PORT-A-CATH;  Surgeon: Adin [name], MD;  Location: [facility];  Service: General;  Laterality: Left;    REVIEW OF SYSTEMS:  A 10 point review of systems was conducted and is otherwise negative except for what is noted above.      PHYSICAL EXAMINATION: Blood pressure 132/74, pulse 73, temperature 98.1 F (36.7 C), temperature source Oral, resp. rate 20, height _0  (1.6 m), weight 130 lb 6.4 oz (59.149 kg). Body mass index is 23.11 kg/(m^2). General: Patient is a well appearing female in no acute distress HEENT: PERRLA, sclerae anicteric no conjunctival pallor, MMM Neck: supple, no palpable adenopathy Lungs: clear to auscultation bilaterally, no wheezes, rhonchi, or rales Cardiovascular: regular rate rhythm, S1, S2, no murmurs, rubs or gallops Abdomen: Soft, non-tender, non-distended, normoactive bowel sounds, no  HSM Extremities: warm and well perfused, no clubbing, cyanosis, or edema Skin: No rashes or lesions Neuro: Non-focal Breasts: deferred.   ECOG PERFORMANCE STATUS: 0 - Asymptomatic      LABORATORY DATA: Lab Results  Component Value Date   WBC 4.1 11/29/2013   HGB 13.0 11/29/2013   HCT 38.0 11/29/2013   MCV 95.6 11/29/2013   PLT 266 11/29/2013      Chemistry      Component Value Date/Time   NA 143 11/29/2013 0932   NA 143 05/11/2013 0940   K 4.3 11/29/2013 0932   K 3.8 05/11/2013 0940   CL 106 05/11/2013 0940   CL 107 03/22/2013 0811   CO2 29 11/29/2013 0932   CO2 27 05/11/2013 0940   BUN 17.8 11/29/2013 0932   BUN 14 05/11/2013 0940   CREATININE 0.7 11/29/2013 0932   CREATININE 0.60 05/11/2013 0940      Component Value Date/Time   CALCIUM 9.6 11/29/2013 0932   CALCIUM 9.2 05/11/2013 0940   ALKPHOS 108 11/29/2013 0932   ALKPHOS 69 04/08/2013  0942   AST 20 11/29/2013 0932   AST 22 04/08/2013 0942   ALT 16 11/29/2013 0932   ALT 20 04/08/2013 0942   BILITOT 0.46 11/29/2013 0932   BILITOT 0.6 04/08/2013 0942     ADDITIONAL INFORMATION: PROGNOSTIC INDICATORS - ACIS Results: IMMUNOHISTOCHEMICAL AND MORPHOMETRIC ANALYSIS BY THE AUTOMATED CELLULAR IMAGING SYSTEM (ACIS) Estrogen Receptor: 100%, POSITIVE, STRONG STAINING INTENSITY Progesterone Receptor: 0%, NEGATIVE COMMENT: The negative hormone receptor study in this case has no internal positive control. REFERENCE RANGE ESTROGEN RECEPTOR NEGATIVE <1% POSITIVE =>1% PROGESTERONE RECEPTOR NEGATIVE <1% POSITIVE =>1% All controls stained appropriately Claudette Laws MD Pathologist, Electronic Signature ( Signed 04/22/2013) CHROMOGENIC IN-SITU HYBRIDIZATION Results: HER-2/NEU BY CISH - NO AMPLIFICATION OF HER-2 DETECTED. RESULT RATIO OF HER2: CEP 17 SIGNALS 0.91 AVERAGE HER2 COPY NUMBER PER CELL 2.15 1 of 4 FINAL for LAYLAMARIE, MEUSER ([id number]) ADDITIONAL INFORMATION:(continued) REFERENCE RANGE NEGATIVE HER2/Chr17 Ratio <2.0 and Average HER2 copy number <4.0 EQUIVOCAL HER2/Chr17 Ratio <2.0 and Average HER2 copy number 4.0 and <6.0 POSITIVE HER2/Chr17 Ratio >=2.0 and/or Average HER2 copy number >=6.0 Claudette Laws MD Pathologist, Electronic Signature ( Signed 04/22/2013) FINAL DIAGNOSIS Diagnosis Breast, modified radical mastectomy , Right and axillary contents - RESIDUAL INVASIVE DUCAL CARCINOMA, NOTTINGHAM COMBINED HISTOLOGIC GRADE II, 4.3 CM - INTERMEDIATE GRADE DUCTAL CARCINOMA IN SITU. - ANGIOLYMPHATIC INVASION PRESENT. - EXTENSIVE STROMAL FIBROSIS, CONSISTENT WITH POST-TREATMENT EFFECT. - FIVE OF TWENTY-THREE LYMPH NODES, POSITIVE FOR METASTATIC CARCINOMA (5/23). - RESECTION MARGINS, NEGATIVE FOR ATYPIA OR MALIGNANCY. - PLEASE SEE ONCOLOGY TEMPLATE FOR DETAILS. Microscopic Comment BREAST, INVASIVE TUMOR, WITH LYMPH NODE SAMPLING Specimen, including laterality:  Right breast and axillary lymph nodes Procedure: Modified radical mastectomy with lymph node dissection Grade: II Tubule formation: 2 Nuclear pleomorphism: 2 Mitotic: 1 Tumor size (gross measurement): 4.3 cm (clusters of tumor cells contiguously spread throughout the sections; dense stromal fibrosis in the background, consistent with post-treatment effect) Margins: Negative Invasive, distance to closest margin: Deep margin: more than 1 cm In-situ, distance to closest margin: Deep margin: more then 1 cm Lymphovascular invasion: Present Ductal carcinoma in situ: Present Grade: Intermediate grade Extensive intraductal component: No Lobular neoplasia: N/A Tumor focality: Unifocal Treatment effect: Present, only in the breast tissue Extent of tumor: Skin: No Nipple: No Skeletal muscle: N/A Lymph nodes: # examined: 23 Lymph nodes with metastasis: 5 2 of 4 FINAL for SADYE, KIERNAN A ([id number]) Microscopic Comment(continued) Isolated tumor cells (< 0.2 mm): 0 Micrometastasis: (>  0.2 mm and < 2.0 mm): 0 Macrometastasis: (> 2.0 mm): 5 Extracapsular extension: No Breast prognostic profile: Please correlate with previous case [phone] Estrogen receptor: 100%, positive, strong staining intensity. Progesterone receptor: 96%, positive, strong staining intensity. Her 2 neu: No amplification, the ratio of Her 2:CEP 17 signals was 0.86 Ki-67: 66%; The breast prognostic profile will be repeated and an addendum report will follow. Non-neoplastic breast: Dense stromal fibrosis, consistent with post-treatment effect. TNM: ypT2, ypN2a Comments: A breast prognostic profile will be repeated and an addendum report will follow. Sections show invasive ductal carcinoma with micropapillary lobular features. Immunohistochemical stain for E-Cadherin was performed an the tumor cells are strongly positive for E-Cadherin. The tumor is contiguously spreading throughout the sections with associated  fibrotic stroma in the background; the morphologic features are mostly consistent with post-treatment effect. (HCL:kh 04-18-13) Aldona Bar MD Pathologist, Electronic Signature (Case signed 04/20/2013) Specimen Gross and Clinical Information   RADIOGRAPHIC STUDIES:  No results found.  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 The tumor was ER positive PR negative HER-2/neu negative with an elevated Ki-67 of 66%. She received neoadjuvant antiestrogen therapy consisting of letrozole 2.5 mg. She had an Oncotype DX testing performed on her biopsy tissue that put her in the intermediate risk category.   #2 patient is now status post right modified radical mastectomy with axillary lymph node dissection. She does have significant residual disease measuring 4.3 cm which is ER positive PR negative HER-2/neu negative. 5 of 23 lymph nodes were positive for metastatic disease.   #3 patient with prior history of left breast cancer which was DCIS she underwent a lumpectomy followed by radiation.  #4   S/P CMF x cycle 6 completed 09/06/13.  #5 patient will now begin adjuvant antiestrogen therapy with Arimidex 1 mg daily. Risks benefits and side effects of treatment were discussed with the patient literature was given to her. This is curative intent with duration of therapy for 5-7 years.  PLAN:  #1 proceed with Arimidex 1 mg daily  #2 patient will be seen back in 3 months time for followup  #3 we discussed survivorship issues and ongoing surveillance. She was given information on survivorship and after breast cancer care.   I spent 25 minutes counseling the patient face to face.  The total time spent in the appointment was 30 minutes.    Marcy Panning, MD Medical/Oncology [facility] [phone] (beeper) [phone] (Office)  11/29/2013, 10:42 AM

## 2013-12-08 ENCOUNTER — Ambulatory Visit
Admission: RE | Admit: 2013-12-08 | Discharge: 2013-12-08 | Disposition: A | Discharge status: Home / Self Care | Payer: Medicare Other | Source: Ambulatory Visit | Attending: Radiation Oncology | Admitting: Radiation Oncology

## 2013-12-08 VITALS — BP 111/69 | HR 77 | Temp 97.8°F | Wt 127.2 lb

## 2013-12-08 DIAGNOSIS — C50919 Malignant neoplasm of unspecified site of unspecified female breast: Secondary | ICD-10-CM

## 2013-12-08 DIAGNOSIS — C773 Secondary and unspecified malignant neoplasm of axilla and upper limb lymph nodes: Principal | ICD-10-CM

## 2013-12-08 NOTE — Progress Notes (Signed)
Department of Radiation Oncology  Phone:  [phone] Fax:        [phone]   Name: Mary Young MRN: [id number]  DOB: [birth year]  Date: 12/08/2013  Follow Up Visit Note  Diagnosis: T2 N2 right breast cancer  Summary and Interval since last radiation: Radiation to the chest wall, axilla, and supraclavicular fossa completed 11/04/2013  Interval History: Mary Young presents today for routine followup.  She enjoyed the holidays and a trip up to [place] to be with her family. Her skin is healing up well. She still using her lotion with vitamin E. She is taking anastrozole. She is ready for her next mammogram. She had some questions regarding the interval between her mammogram in September versus may. She would like to speak with the radiologist regarding this. They're headed on a cruise and will not be back until the end of February.  Allergies:  Allergies  Allergen Reactions  . Vicodin [Hydrocodone-Acetaminophen] Nausea And Vomiting  . Percocet [Oxycodone-Acetaminophen] Nausea And Vomiting  . Precipitated Sulfur [Sulfur] Itching    Eye medication  . Timolol Rash    Eye medication with preservatives    Medications:  Current Outpatient Prescriptions  Medication Sig Dispense Refill  . anastrozole (ARIMIDEX) 1 MG tablet Take 1 tablet (1 mg total) by mouth daily.  90 tablet  12  . Ascorbic Acid (VITAMIN C PO) Take by mouth.      Marland Kitchen aspirin 325 MG tablet Take 325 mg by mouth 2 (two) times daily as needed.      . Chlorpheniramine Maleate (CHLOR-TRIMETON PO) Take 1 tablet by mouth as needed.      . cholecalciferol (VITAMIN D) 400 UNITS TABS tablet Take 400 Units by mouth 2 (two) times daily.      . dorzolamide (TRUSOPT) 2 % ophthalmic solution Place 1 drop into both eyes 2 (two) times daily.       Marland Kitchen losartan (COZAAR) 50 MG tablet Take 50 mg by mouth daily.      . Multiple Vitamin (MULTIVITAMIN WITH MINERALS) TABS Take 1 tablet by mouth daily.      . simvastatin (ZOCOR) 20 MG  tablet Take 20 mg by mouth every other day.       . timolol (TIMOPTIC) 0.5 % ophthalmic solution Place 1 drop into both eyes 2 (two) times daily. Non preservative      . TRAVATAN Z 0.004 % SOLN ophthalmic solution Place 1 drop into both eyes at bedtime.       Marland Kitchen UNABLE TO FIND Rx: L8000- Post Surgical Bras (Quantity: 6) N4709- Silicone Breast Prosthesis (Quantity: 1) Dx: 174.9; Right mastectomy  1 each  0  . vitamin E 400 UNIT capsule Take 400 Units by mouth daily.       No current facility-administered medications for this encounter.   Facility-Administered Medications Ordered in Other Encounters  Medication Dose Route Frequency Provider Last Rate Last Dose  . 0.9 %  sodium chloride infusion   Intravenous Once [name], NP        Physical Exam:  Filed Vitals:   12/08/13 1428  BP: 111/69  Pulse: 77  Temp: 97.8 F (36.6 C)  Weight: 127 lb 3.2 oz (57.698 kg)   pleasant female in no distress sitting comfortably on examining table. She has some skin darkening over the right chest wall. She was concerned about an area near her sternum where she felt a lump. This is consistent with the costochondral junction. There is no visible or palpable  evidence of tumor recurrence.  IMPRESSION: Mary Young is a 73 y.o. female status post mastectomy and postmastectomy radiation with resolving acute effects of treatment  PLAN:  She looks great. She is taking care of herself. I would recommend continuing lotion with vitamin E. in followup with medical oncology. I will speak with [facility] imaging regarding this. I let her know that according to the reports really was nothing on her mammogram in 2012 and then calcifications appeared in 2013. The behaviors tumors is really difficult to understand and appreciate. She understands that while no one may be able to tell her what happened between this to mammogram she would just like to feel confident that she is going to the best place. I will talk to Dr. Sadie Haber  and see if she would be willing to see her after her mammogram in sort of go over things with her. I let her know to use some protection in the treated area. I be happy to see her back on an as-needed basis.   Thea Silversmith, MD

## 2013-12-08 NOTE — Progress Notes (Signed)
Patient for routine one month follow up completion of radiation to right chest wall.Skin discolored.Appying lotion with vitamin e twice daily.No fatigue.sleeping has improved.Taking anastrozole daily.Has dry mouth since chemotherapy, to see dentis regarding this but she is using biotene.

## 2013-12-14 ENCOUNTER — Other Ambulatory Visit: Payer: Self-pay | Admitting: Oncology

## 2013-12-14 DIAGNOSIS — Z853 Personal history of malignant neoplasm of breast: Secondary | ICD-10-CM

## 2013-12-14 DIAGNOSIS — Z9011 Acquired absence of right breast and nipple: Secondary | ICD-10-CM

## 2013-12-20 ENCOUNTER — Ambulatory Visit
Admission: RE | Admit: 2013-12-20 | Discharge: 2013-12-20 | Disposition: A | Discharge status: Home / Self Care | Payer: Medicare Other | Source: Ambulatory Visit | Attending: Oncology | Admitting: Oncology

## 2013-12-20 DIAGNOSIS — Z853 Personal history of malignant neoplasm of breast: Secondary | ICD-10-CM

## 2013-12-20 DIAGNOSIS — Z9011 Acquired absence of right breast and nipple: Secondary | ICD-10-CM

## 2014-01-17 ENCOUNTER — Telehealth: Payer: Self-pay | Admitting: Oncology

## 2014-01-17 NOTE — Telephone Encounter (Signed)
, °

## 2014-03-01 ENCOUNTER — Telehealth: Payer: Self-pay | Admitting: Adult Health

## 2014-03-01 ENCOUNTER — Ambulatory Visit (HOSPITAL_BASED_OUTPATIENT_CLINIC_OR_DEPARTMENT_OTHER): Payer: Medicare Other | Admitting: Adult Health

## 2014-03-01 ENCOUNTER — Other Ambulatory Visit (HOSPITAL_BASED_OUTPATIENT_CLINIC_OR_DEPARTMENT_OTHER): Payer: Medicare Other

## 2014-03-01 ENCOUNTER — Encounter: Payer: Self-pay | Admitting: Adult Health

## 2014-03-01 VITALS — BP 127/74 | HR 74 | Temp 98.5°F | Resp 18 | Ht 63.0 in | Wt 132.0 lb

## 2014-03-01 DIAGNOSIS — M549 Dorsalgia, unspecified: Secondary | ICD-10-CM

## 2014-03-01 DIAGNOSIS — K7689 Other specified diseases of liver: Secondary | ICD-10-CM

## 2014-03-01 DIAGNOSIS — C50419 Malignant neoplasm of upper-outer quadrant of unspecified female breast: Secondary | ICD-10-CM

## 2014-03-01 DIAGNOSIS — R5381 Other malaise: Secondary | ICD-10-CM

## 2014-03-01 DIAGNOSIS — M79609 Pain in unspecified limb: Secondary | ICD-10-CM

## 2014-03-01 DIAGNOSIS — R5383 Other fatigue: Secondary | ICD-10-CM

## 2014-03-01 DIAGNOSIS — C773 Secondary and unspecified malignant neoplasm of axilla and upper limb lymph nodes: Secondary | ICD-10-CM

## 2014-03-01 DIAGNOSIS — Z170 Estrogen receptor positive status [ER+]: Secondary | ICD-10-CM

## 2014-03-01 LAB — COMPREHENSIVE METABOLIC PANEL (CC13)
ALT: 17 U/L (ref 0–55)
AST: 21 U/L (ref 5–34)
Albumin: 3.9 g/dL (ref 3.5–5.0)
Alkaline Phosphatase: 87 U/L (ref 40–150)
Anion Gap: 7 mEq/L (ref 3–11)
BUN: 14.4 mg/dL (ref 7.0–26.0)
CO2: 28 mEq/L (ref 22–29)
Calcium: 9.5 mg/dL (ref 8.4–10.4)
Chloride: 105 mEq/L (ref 98–109)
Creatinine: 0.7 mg/dL (ref 0.6–1.1)
Glucose: 94 mg/dl (ref 70–140)
Potassium: 4 mEq/L (ref 3.5–5.1)
Sodium: 140 mEq/L (ref 136–145)
Total Bilirubin: 0.64 mg/dL (ref 0.20–1.20)
Total Protein: 6.7 g/dL (ref 6.4–8.3)

## 2014-03-01 LAB — CBC WITH DIFFERENTIAL/PLATELET
BASO%: 0.7 % (ref 0.0–2.0)
Basophils Absolute: 0 10*3/uL (ref 0.0–0.1)
EOS%: 9.8 % — ABNORMAL HIGH (ref 0.0–7.0)
Eosinophils Absolute: 0.4 10*3/uL (ref 0.0–0.5)
HCT: 39.9 % (ref 34.8–46.6)
HGB: 13.3 g/dL (ref 11.6–15.9)
LYMPH%: 23.3 % (ref 14.0–49.7)
MCH: 31.1 pg (ref 25.1–34.0)
MCHC: 33.4 g/dL (ref 31.5–36.0)
MCV: 93.1 fL (ref 79.5–101.0)
MONO#: 0.3 10*3/uL (ref 0.1–0.9)
MONO%: 7.9 % (ref 0.0–14.0)
NEUT#: 2.4 10*3/uL (ref 1.5–6.5)
NEUT%: 58.3 % (ref 38.4–76.8)
Platelets: 195 10*3/uL (ref 145–400)
RBC: 4.29 10*6/uL (ref 3.70–5.45)
RDW: 13.2 % (ref 11.2–14.5)
WBC: 4.2 10*3/uL (ref 3.9–10.3)
lymph#: 1 10*3/uL (ref 0.9–3.3)

## 2014-03-01 NOTE — Telephone Encounter (Signed)
, °

## 2014-03-01 NOTE — Patient Instructions (Signed)
You are doing well.  Continue taking Arimidex every day.  We will get an MRI of the abdomen and a bone scan to evaluate your pain.  Please call us if you have any questions or concerns.

## 2014-03-01 NOTE — Progress Notes (Signed)
Hematology and Oncology Follow Up Visit  Mary Young [id number] 08-20-1941 73 y.o. 03/03/2014 6:23 AM     Principle Diagnosis:Mary Young 73 y.o. 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     Prior Therapy: #1 patient underwent a screening mammogram in September 2013 that showed a right breast mass with associated calcifications. On MRI the mass measured 4.7 x 3.6 x 5.2 cm lymph node measuring 1.5 x 1.0 x 2.0 cm. Patient had a needle core biopsy performed that showed invasive mammary carcinoma ER positive PR positive HER-2/neu negative with Ki-67 elevated at 66%. The lymph node was biopsied and was consistent with metastatic ductal carcinoma.   #2 patient was seen in the multidisciplinary breast clinic for discussion of treatment options. She had a Oncotype DX testing performed on the biopsied tissue her recurrence score was 28 giving her 18% risk of distant recurrence with antiestrogen therapy. She was in the intermediate risk category. Patient was enrolled on neoadjuvant antiestrogen treatment clinical trial.   #3 she is status post neoadjuvant letrozole 2.5 mg starting in November 2013 - June 2014.   #4 patient also has prior history of left lumpectomy and radiation for DCIS treated in 2003. This tumor was apparently ER and PR negative so she did not receive adjuvant tamoxifen.   #5 patient is now status post right modified radical mastectomy with axillary lymph node dissection. The final pathology revealed 4.3 cm invasive ductal carcinoma 5 lymph nodes were positive for metastatic disease. Tumor ER +100% PR negative Ki-67 66%. Pathologic staging T2 N2. Patient's case was discussed at the multidisciplinary breast conference. Adjuvant chemotherapy was recommended either doing Taxotere Cytoxan every 3 weeks. However Dexamtheasone usage, patient may not be a good candidate for this. We therefore discussed the possibility of doing CMF every 21  days for a total of 6 cycles. Once she completes the chemotherapy then she will proceed to postmastectomy radiation therapy.   #6 s/p CMF every 21 days adjuvantly from 05/23/2013-08/30/2013   #7 status post adjuvant radiation therapy to the right chest wall administered by Dr. Thea Silversmith between 09/22/13 - 11/11/13   #8 Begin arimidex 1 milligram daily adjuvantly with curative intent for duration of 5-7 years beginning 11/29/2013  Current therapy:  Arimidex daily  Interim History: Mary Young 73 y.o. female with stage III ER/PR positive invasive mammary carcinoma is here for f/u and evaluation.  She is taking Arimidex daily and is tolerating it well.  She does have occasional hot flashes.  She does have stiffness of her joints.  She has vaginal dryness and dry eyes.   She is concerned due to pain she has had for the past few weeks in her spine and down her left leg.  She communicates to me that in her breast cancer journey she feels like she hasn't gotten the same answer from every person, and she doesn't know who she can trust.  She is concerned that she could possibly have recurrence and is also requesting that the indeterminate area of hypermetabolism seen on PET be re-imaged as well. Otherwise she is doing well and a 10 point ROS is neg.  She has had a recent mammogram, and her bone density is due next month.    Medications:  Current Outpatient Prescriptions  Medication Sig Dispense Refill  . aspirin 325 MG tablet Take 325 mg by mouth 2 (two) times daily as needed.      . Chlorpheniramine Maleate (CHLOR-TRIMETON PO)  Take 1 tablet by mouth as needed.      . cholecalciferol (VITAMIN D) 400 UNITS TABS tablet Take 400 Units by mouth 2 (two) times daily.      . dorzolamide (TRUSOPT) 2 % ophthalmic solution Place 1 drop into both eyes 3 (three) times daily.       Marland Kitchen losartan (COZAAR) 50 MG tablet Take 50 mg by mouth daily.      . Multiple Vitamin (MULTIVITAMIN WITH MINERALS) TABS Take 1  tablet by mouth daily.      . Omega-3 Fatty Acids (FISH OIL PO) Take 1 capsule by mouth.      . simvastatin (ZOCOR) 20 MG tablet Take 20 mg by mouth every other day.       . timolol (TIMOPTIC) 0.5 % ophthalmic solution Place 1 drop into both eyes 2 (two) times daily. Non preservative      . TRAVATAN Z 0.004 % SOLN ophthalmic solution Place 1 drop into both eyes at bedtime.       Marland Kitchen UNABLE TO FIND Rx: L8000- Post Surgical Bras (Quantity: 6) H6073- Silicone Breast Prosthesis (Quantity: 1) Dx: 174.9; Right mastectomy  1 each  0   No current facility-administered medications for this visit.   Facility-Administered Medications Ordered in Other Visits  Medication Dose Route Frequency Provider Last Rate Last Dose  . 0.9 %  sodium chloride infusion   Intravenous Once [name], NP         Allergies:  Allergies  Allergen Reactions  . Vicodin [Hydrocodone-Acetaminophen] Nausea And Vomiting  . Percocet [Oxycodone-Acetaminophen] Nausea And Vomiting  . Precipitated Sulfur [Sulfur] Itching    Eye medication  . Timolol Rash    Eye medication with preservatives    Medical History: Past Medical History  Diagnosis Date  . Breast cancer     left lumpectomy  . Hypertension   . Glaucoma   . Central retinal vein occlusion   . Joint pain   . Dyslipidemia   . H/O echocardiogram 09/27/2009    Note to have some aortic valve sclerosis, mild mitral valve prolapse, mild mitral regurgitation, and mild asymptomatic LVH with an EF>55%  . History of stress test 09/27/2009    Normal Myocardial perfusion study. No significant ischemia demonstrated, this low risk scan. compared to the previous study.,there is no significant change, Normal myocardial Perfusion imaging. EF>83%  . Seasonal allergies     Surgical History:  Past Surgical History  Procedure Laterality Date  . Abdominal hysterectomy  1991  . Breast lumpectomy  2003  . Arthroscopy knee w/ drilling  2005/2008    Left knee/Right knee   . Tubal ligation  1983  . Mastectomy modified radical Right 04/15/2013    Procedure: MASTECTOMY MODIFIED RADICAL;  Surgeon: Adin [name], MD;  Location: [facility];  Service: General;  Laterality: Right;  . Portacath placement Left 05/13/2013    Procedure: INSERTION PORT-A-CATH;  Surgeon: Adin [name], MD;  Location: [facility];  Service: General;  Laterality: Left;     Review of Systems: A 10 point review of systems was conducted and is otherwise negative except for what is noted above.    Health Maintenance  Mammogram: 12/20/13 Bone Density Scan:  04/02/12   Physical Exam: Blood pressure 127/74, pulse 74, temperature 98.5 F (36.9 C), temperature source Oral, resp. rate 18, height '5\' 3"'  (1.6 m), weight 132 lb (59.875 kg). GENERAL: Patient is a well appearing older female in no acute distress HEENT:  Sclerae anicteric.  Oropharynx  clear and moist. No ulcerations or evidence of oropharyngeal candidiasis. Neck is supple.  NODES:  No cervical, supraclavicular, or axillary lymphadenopathy palpated.  BREAST EXAM: right mastectomy site without nodularity or sign of recurrence.  Left breast no mass, nodule, skin change, or lesion.   LUNGS:  Clear to auscultation bilaterally.  No wheezes or rhonchi. HEART:  Regular rate and rhythm. No murmur appreciated. ABDOMEN:  Soft, nontender.  Positive, normoactive bowel sounds. No organomegaly palpated. MSK:  No focal spinal tenderness to palpation. Full range of motion bilaterally in the upper extremities. EXTREMITIES:  No peripheral edema.   SKIN:  Clear with no obvious rashes or skin changes. No nail dyscrasia. NEURO:  Nonfocal. Well oriented.  Appropriate affect. ECOG PERFORMANCE STATUS: 1 - Symptomatic but completely ambulatory   Lab Results: Lab Results  Component Value Date   WBC 4.2 03/01/2014   HGB 13.3 03/01/2014   HCT 39.9 03/01/2014   MCV 93.1 03/01/2014   PLT 195 03/01/2014     Chemistry      Component Value Date/Time   NA 140  03/01/2014 1318   NA 143 05/11/2013 0940   K 4.0 03/01/2014 1318   K 3.8 05/11/2013 0940   CL 106 05/11/2013 0940   CL 107 03/22/2013 0811   CO2 28 03/01/2014 1318   CO2 27 05/11/2013 0940   BUN 14.4 03/01/2014 1318   BUN 14 05/11/2013 0940   CREATININE 0.7 03/01/2014 1318   CREATININE 0.60 05/11/2013 0940      Component Value Date/Time   CALCIUM 9.5 03/01/2014 1318   CALCIUM 9.2 05/11/2013 0940   ALKPHOS 87 03/01/2014 1318   ALKPHOS 69 04/08/2013 0942   AST 21 03/01/2014 1318   AST 22 04/08/2013 0942   ALT 17 03/01/2014 1318   ALT 20 04/08/2013 0942   BILITOT 0.64 03/01/2014 1318   BILITOT 0.6 04/08/2013 0942     Assessment and Plan: Wardell Heath 73 y.o. female with  1. Stage III ER/PR positive invasive mammary carcinoma of the right breast.  The patient is doing well today and has no sign of recurrence. She will continue to take Arimidex daily.  She is tolerating it moderately well.  Due to the indeterminate hypermetabolic lesion on the liver seen on her PET scan in 2013 during her staging, we will get a MRI of the abdomen to evaluate and assess for stability.  2.  Pain.  The patient in her back and down her leg is concerning.  We will get a bone scan to evaluate for bony metastases.  Should it continue she may need further imaging and evaluation in the future.    3.  Survivorship.  The patient is due for a bone density next month.  She is seeing her PCP regarding this.  She will return in 6 months for labs and evaluation.   She knows to call us in the interim for any questions or concerns.  We can certainly see her sooner if needed.   I spent 25 minutes counseling the patient face to face.  The total time spent in the appointment was 30 minutes.  [name], [place] [phone] 03/03/2014 6:23 AM

## 2014-03-02 ENCOUNTER — Other Ambulatory Visit: Payer: Medicare Other

## 2014-03-02 ENCOUNTER — Ambulatory Visit: Payer: Medicare Other | Admitting: Oncology

## 2014-03-15 ENCOUNTER — Encounter (HOSPITAL_COMMUNITY): Payer: Self-pay

## 2014-03-15 ENCOUNTER — Ambulatory Visit (HOSPITAL_COMMUNITY)
Admission: RE | Admit: 2014-03-15 | Discharge: 2014-03-15 | Disposition: A | Discharge status: Home / Self Care | Payer: Medicare Other | Source: Ambulatory Visit | Attending: Adult Health | Admitting: Adult Health

## 2014-03-15 ENCOUNTER — Encounter (HOSPITAL_COMMUNITY)
Admission: RE | Admit: 2014-03-15 | Discharge: 2014-03-15 | Disposition: A | Discharge status: Home / Self Care | Payer: Medicare Other | Source: Ambulatory Visit | Attending: Adult Health | Admitting: Adult Health

## 2014-03-15 DIAGNOSIS — C50419 Malignant neoplasm of upper-outer quadrant of unspecified female breast: Secondary | ICD-10-CM

## 2014-03-15 MED ORDER — TECHNETIUM TC 99M MEDRONATE IV KIT
26.1000 | PACK | Freq: Once | INTRAVENOUS | Status: AC | PRN
  Administered 2014-03-15: 26.1 via INTRAVENOUS

## 2014-03-15 MED ORDER — GADOBENATE DIMEGLUMINE 529 MG/ML IV SOLN
12.0000 mL | Freq: Once | INTRAVENOUS | Status: AC | PRN
  Administered 2014-03-15: 12 mL via INTRAVENOUS

## 2014-03-16 ENCOUNTER — Telehealth: Payer: Self-pay

## 2014-03-16 NOTE — Telephone Encounter (Signed)
Message copied by Prentiss Bells on Thu Mar 16, 2014 10:20 AM ------      Message from: [name]      Created: Wed Mar 15, 2014  9:54 PM       Patient's MRI is neg. For cancer.  Please ask her when her last colonoscopy was.            Thanks,       [place]      ----- Message -----         From: Rad Results In Interface         Sent: 03/15/2014  12:21 PM           To: [name], NP                   ------

## 2014-03-16 NOTE — Telephone Encounter (Signed)
Per LC notes below - advised pt no sign of cancer on MRI or bone scan.  Pt states she is due for a colonoscopy and is seeing her PCP tomorrow.  She will discuss both colonoscopy and bone degeneration with Laurann Montana tomorrow.  Pt verbalized understanding.        Message Received: 1 day ago        [name], NP Hoy Finlay, RN Cc: Prentiss Bells, RN            Please call patient with results. No cancer, just degeneration. She should f/u with her pcp.   Thanks ,  [place]

## 2014-03-23 ENCOUNTER — Encounter: Payer: Self-pay | Admitting: Gastroenterology

## 2014-03-31 ENCOUNTER — Ambulatory Visit: Payer: Medicare Other | Admitting: Gastroenterology

## 2014-04-12 ENCOUNTER — Ambulatory Visit (INDEPENDENT_AMBULATORY_CARE_PROVIDER_SITE_OTHER): Payer: Medicare Other | Admitting: Physician Assistant

## 2014-04-12 ENCOUNTER — Encounter: Payer: Self-pay | Admitting: Physician Assistant

## 2014-04-12 VITALS — BP 128/80 | HR 72 | Ht 63.5 in | Wt 134.4 lb

## 2014-04-12 DIAGNOSIS — R935 Abnormal findings on diagnostic imaging of other abdominal regions, including retroperitoneum: Secondary | ICD-10-CM

## 2014-04-12 DIAGNOSIS — Z853 Personal history of malignant neoplasm of breast: Secondary | ICD-10-CM

## 2014-04-12 NOTE — Patient Instructions (Signed)

## 2014-04-12 NOTE — Progress Notes (Signed)
Reviewed and agree with management. Mate Alegria D. Chayil Gantt, M.D., FACG  

## 2014-04-12 NOTE — Progress Notes (Signed)
Subjective:    Patient ID: Mary Young, female    DOB: [birth year], 73 y.o.   MRN: [id number]  HPI  Mary Young is a very nice 73 year old white female known previously to Dr. Deatra Ina from prior colonoscopy. She is referred today by her PCP Dr. Kelton Pillar for consideration of repeat colonoscopy. Patient has history of breast cancer which was diagnosed in 2013 this was a metastatic ductal carcinoma. She has completed mastectomy chemotherapy and radiation and is doing well. She had an MRI done on 03/15/2014 for followup of a potential liver lesion. MRI showed a tiny 5 mm cyst in the right lobe of the liver and also a 3.5 cm heterogeneous focus with increased T2 intensity in the cecum. He was question by the radiologist whether this may be bowel contents. She also had a recent bone scan which was negative Last colonoscopy was done in 2007 and showed diverticulosis but otherwise a normal exam..  Currently is asymptomatic, she says she's been feeling good has no complaints of abdominal pain changes in her bowel habits melena or hematochezia.    Review of Systems  Constitutional: Negative.   HENT: Negative.   Respiratory: Negative.   Cardiovascular: Negative.   Gastrointestinal: Negative.   Endocrine: Negative.   Genitourinary: Negative.   Musculoskeletal: Negative.   Skin: Negative.   Allergic/Immunologic: Negative.   Neurological: Negative.   Hematological: Negative.   Psychiatric/Behavioral: Negative.    Outpatient Prescriptions Prior to Visit  Medication Sig Dispense Refill  . aspirin 325 MG tablet Take 325 mg by mouth 2 (two) times daily as needed.      . Chlorpheniramine Maleate (CHLOR-TRIMETON PO) Take 1 tablet by mouth as needed.      . cholecalciferol (VITAMIN D) 400 UNITS TABS tablet Take 400 Units by mouth 2 (two) times daily.      . dorzolamide (TRUSOPT) 2 % ophthalmic solution Place 1 drop into both eyes 3 (three) times daily.       Marland Kitchen losartan (COZAAR) 50 MG tablet Take  50 mg by mouth daily.      . Multiple Vitamin (MULTIVITAMIN WITH MINERALS) TABS Take 1 tablet by mouth daily.      . Omega-3 Fatty Acids (FISH OIL PO) Take 1 capsule by mouth.      . simvastatin (ZOCOR) 20 MG tablet Take 20 mg by mouth every other day.       . timolol (TIMOPTIC) 0.5 % ophthalmic solution Place 1 drop into both eyes 2 (two) times daily. Non preservative      . TRAVATAN Z 0.004 % SOLN ophthalmic solution Place 1 drop into both eyes at bedtime.       Marland Kitchen UNABLE TO FIND Rx: L8000- Post Surgical Bras (Quantity: 6) G3151- Silicone Breast Prosthesis (Quantity: 1) Dx: 174.9; Right mastectomy  1 each  0   Facility-Administered Medications Prior to Visit  Medication Dose Route Frequency Provider Last Rate Last Dose  . 0.9 %  sodium chloride infusion   Intravenous Once [name], NP           Allergies  Allergen Reactions  . Vicodin [Hydrocodone-Acetaminophen] Nausea And Vomiting  . Percocet [Oxycodone-Acetaminophen] Nausea And Vomiting  . Precipitated Sulfur [Sulfur] Itching    Eye medication  . Timolol Rash    Eye medication with preservatives   Patient Active Problem List   Diagnosis Date Noted  . Breast cancer metastasized to axillary lymph node 12/27/2012  . History of breast cancer 09/16/2012  . Cancer of  upper-outer quadrant of female breast 09/03/2012  . HYPERLIPIDEMIA 07/10/2007  . HEART MURMUR, HX OF 07/10/2007  . ARTHROSCOPY, KNEE, HX OF 07/10/2007     History  Substance Use Topics  . Smoking status: Never Smoker   . Smokeless tobacco: Never Used  . Alcohol Use: Yes     Comment: occasional glass of wine   family history includes Cancer in her brother.  Objective:   Physical Exam  well-developed older white female in no acute distress, pleasant. Blood pressure 128/80 pulse 72 height 5 foot 3 weight 134. HEENT; nontraumatic normocephalic EOMI PERRLA sclera anicteric, Supple ;no JVD, Cardiovascular; regular rate and rhythm with S1-S2 no murmur or  gallop, Pulm;clear bilaterally, Abdomen ;soft nontender nondistended bowel sounds are active there is no palpable mass or hepatosplenomegaly, Rectal; exam not done, Extremities; no clubbing cyanosis or edema skin warm and dry, Psych ;mood and affect appropriate        Assessment & Plan:  #73 year old female with personal history of metastatic ductal carcinoma of the breast status post mastectomy chemotherapy and radiation #2 recent abnormal MRI with a 3.5 cm heterogeneous focus of increased T2 intensity in the cecum-rule out colon lesion #3 diverticulosis  Plan; Patient will be scheduled for colonoscopy with Dr. Michelene Heady was discussed in detail with the patient and she is agreeable to proceed

## 2014-04-14 ENCOUNTER — Encounter: Payer: Self-pay | Admitting: Gastroenterology

## 2014-05-01 ENCOUNTER — Encounter: Payer: Self-pay | Admitting: Gastroenterology

## 2014-05-22 ENCOUNTER — Telehealth: Payer: Self-pay

## 2014-05-22 NOTE — Telephone Encounter (Signed)
Faxed dispensing order to [facility] for L8020 and L8000.  Sent to scan  

## 2014-05-25 ENCOUNTER — Ambulatory Visit: Payer: Medicare Other | Admitting: Gastroenterology

## 2014-05-25 ENCOUNTER — Ambulatory Visit (INDEPENDENT_AMBULATORY_CARE_PROVIDER_SITE_OTHER): Payer: Medicare Other | Admitting: General Surgery

## 2014-05-25 ENCOUNTER — Encounter: Payer: Self-pay | Admitting: Gastroenterology

## 2014-05-25 VITALS — BP 142/72 | HR 81 | Temp 99.0°F | Resp 28 | Ht 63.0 in | Wt 134.0 lb

## 2014-05-25 DIAGNOSIS — K5730 Diverticulosis of large intestine without perforation or abscess without bleeding: Secondary | ICD-10-CM

## 2014-05-25 DIAGNOSIS — D126 Benign neoplasm of colon, unspecified: Secondary | ICD-10-CM

## 2014-05-25 DIAGNOSIS — R935 Abnormal findings on diagnostic imaging of other abdominal regions, including retroperitoneum: Secondary | ICD-10-CM

## 2014-05-25 MED ORDER — SODIUM CHLORIDE 0.9 % IV SOLN: 500.0000 mL | INTRAVENOUS | Status: DC

## 2014-05-25 NOTE — Progress Notes (Signed)
A/ox3, pleased with MAC, report to RN 

## 2014-05-25 NOTE — Progress Notes (Signed)
Trilby Leaver, RN check this pt in.  I was logged in under Berlinda Last, RN by accident.  Berlinda Last, RN did not check her in. Maw

## 2014-05-25 NOTE — Op Note (Addendum)
[facility]  [address] [place], [address]   COLONOSCOPY PROCEDURE REPORT  PATIENT: Mary Young, Mary Young  MR#: [id number] BIRTHDATE: 04/10/1941 , 73  yrs. old GENDER: Female ENDOSCOPIST: Inda Castle, MD REFERRED HQ:[id number] Laurann Montana, M.D. PROCEDURE DATE:  05/25/2014 PROCEDURE:   Colonoscopy with cold biopsy polypectomy First Screening Colonoscopy - Avg.  risk and is 50 yrs.  old or older - No.  Prior Negative Screening - Now for repeat screening. Other: See Comments  History of Adenoma - Now for follow-up colonoscopy & has been > or = to 3 yrs.  N/A  Polyps Removed Today? Yes. ASA CLASS:   Class II INDICATIONS:abnormal MRI raising the question of a cecal neoplasm.  MEDICATIONS: MAC sedation, administered by CRNA and Propofol (Diprivan) 240 mg IV  DESCRIPTION OF PROCEDURE:   After the risks benefits and alternatives of the procedure were thoroughly explained, informed consent was obtained.  A digital rectal exam revealed no abnormalities of the rectum.   The LB [id number]  endoscope was introduced through the anus and advanced to the cecum, which was identified by both the appendix and ileocecal valve. No adverse events experienced.   The quality of the prep was excellent using Suprep  The instrument was then slowly withdrawn as the colon was fully examined.      COLON FINDINGS: A sessile polyp measuring 2 mm in size was found at the cecum.  A polypectomy was performed with cold forceps. Moderate diverticulosis was noted in the ascending colon.   There was mild scattered diverticulosis noted in the sigmoid colon, descending colon, and transverse colon.   The colon was otherwise normal.  There was no diverticulosis, inflammation, polyps or cancers unless previously stated.  Retroflexed views revealed no abnormalities. The time to cecum=6 minutes 24 seconds.  Withdrawal time=9 minutes 16 seconds.  The scope was withdrawn and the procedure  completed. COMPLICATIONS: There were no complications.  ENDOSCOPIC IMPRESSION: 1.   Sessile polyp measuring 2 mm in size was found at the cecum; polypectomy was performed with cold forceps 2.   Moderate diverticulosis was noted in the ascending colon 3.   There was mild diverticulosis noted in the sigmoid colon, descending colon, and transverse colon 4.   The colon was otherwise normal  RECOMMENDATIONS: If the polyp(s) removed today are proven to be adenomatous (pre-cancerous) polyps, you will need a repeat colonoscopy in 5 years.  Otherwise you should continue to follow colorectal cancer screening guidelines for "routine risk" patients with colonoscopy in 10 years.  You will receive a letter within 1-2 weeks with the results of your biopsy as well as final recommendations.  Please call my office if you have not received a letter after 3 weeks.   eSigned:  Inda Castle, MD 05/25/2014 2:39 PM   cc:   PATIENT NAME:  Mary Young, Mary Young MR#: [id number]

## 2014-05-25 NOTE — Patient Instructions (Addendum)
YOU HAD AN ENDOSCOPIC PROCEDURE TODAY AT THE [facility]: Refer to the procedure report that was given to you for any specific questions about what was found during the examination.  If the procedure report does not answer your questions, please call your gastroenterologist to clarify.  If you requested that your care partner not be given the details of your procedure findings, then the procedure report has been included in a sealed envelope for you to review at your convenience later.  YOU SHOULD EXPECT: Some feelings of bloating in the abdomen. Passage of more gas than usual.  Walking can help get rid of the air that was put into your GI tract during the procedure and reduce the bloating. If you had a lower endoscopy (such as a colonoscopy or flexible sigmoidoscopy) you may notice spotting of blood in your stool or on the toilet paper. If you underwent a bowel prep for your procedure, then you may not have a normal bowel movement for a few days.  DIET: Your first meal following the procedure should be a light meal and then it is ok to progress to your normal diet.  A half-sandwich or bowl of soup is an example of a good first meal.  Heavy or fried foods are harder to digest and may make you feel nauseous or bloated.  Likewise meals heavy in dairy and vegetables can cause extra gas to form and this can also increase the bloating.  Drink plenty of fluids but you should avoid alcoholic beverages for 24 hours. Try to eat a high fiber diet.  ACTIVITY: Your care partner should take you home directly after the procedure.  You should plan to take it easy, moving slowly for the rest of the day.  You can resume normal activity the day after the procedure however you should NOT DRIVE or use heavy machinery for 24 hours (because of the sedation medicines used during the test).    SYMPTOMS TO REPORT IMMEDIATELY: A gastroenterologist can be reached at any hour.  During normal business hours, 8:30 AM to  5:00 PM Monday through Friday, call [phone].  After hours and on weekends, please call the GI answering service at [phone] who will take a message and have the physician on call contact you.   Following lower endoscopy (colonoscopy or flexible sigmoidoscopy):  Excessive amounts of blood in the stool  Significant tenderness or worsening of abdominal pains  Swelling of the abdomen that is new, acute  Fever of 100F or higher  FOLLOW UP: If any biopsies were taken you will be contacted by phone or by letter within the next 1-3 weeks.  Call your gastroenterologist if you have not heard about the biopsies in 3 weeks.  Our staff will call the home number listed on your records the next business day following your procedure to check on you and address any questions or concerns that you may have at that time regarding the information given to you following your procedure. This is a courtesy call and so if there is no answer at the home number and we have not heard from you through the emergency physician on call, we will assume that you have returned to your regular daily activities without incident.  SIGNATURES/CONFIDENTIALITY: You and/or your care partner have signed paperwork which will be entered into your electronic medical record.  These signatures attest to the fact that that the information above on your After Visit Summary has been reviewed and is understood.    Full responsibility of the confidentiality of this discharge information lies with you and/or your care-partner.  Read your handouts that the recovery room nurse gave you.

## 2014-05-25 NOTE — Progress Notes (Signed)
Called to room to assist during endoscopic procedure.  Patient ID and intended procedure confirmed with present staff. Received instructions for my participation in the procedure from the performing physician.  

## 2014-05-26 ENCOUNTER — Telehealth: Payer: Self-pay | Admitting: *Deleted

## 2014-05-26 NOTE — Telephone Encounter (Signed)
  Follow up Call-  Call back number 05/25/2014  Post procedure Call Back phone  # [phone] hm  Permission to leave phone message Yes     Patient questions:  Do you have a fever, pain , or abdominal swelling? No. Pain Score  0 *  Have you tolerated food without any problems? Yes.    Have you been able to return to your normal activities? Yes.    Do you have any questions about your discharge instructions: Diet   No. Medications  No. Follow up visit  No.  Do you have questions or concerns about your Care? No.  Actions: * If pain score is 4 or above: No action needed, pain <4.

## 2014-05-31 ENCOUNTER — Encounter: Payer: Self-pay | Admitting: Gastroenterology

## 2014-06-14 ENCOUNTER — Ambulatory Visit (INDEPENDENT_AMBULATORY_CARE_PROVIDER_SITE_OTHER): Payer: Medicare Other | Admitting: General Surgery

## 2014-06-20 ENCOUNTER — Encounter: Payer: Self-pay | Admitting: Internal Medicine

## 2014-06-20 ENCOUNTER — Ambulatory Visit (INDEPENDENT_AMBULATORY_CARE_PROVIDER_SITE_OTHER): Payer: Medicare Other | Admitting: Internal Medicine

## 2014-06-20 VITALS — BP 136/64 | HR 72 | Ht 63.0 in | Wt 133.0 lb

## 2014-06-20 DIAGNOSIS — I341 Nonrheumatic mitral (valve) prolapse: Secondary | ICD-10-CM

## 2014-06-20 DIAGNOSIS — E785 Hyperlipidemia, unspecified: Secondary | ICD-10-CM

## 2014-06-20 DIAGNOSIS — R002 Palpitations: Secondary | ICD-10-CM | POA: Insufficient documentation

## 2014-06-20 DIAGNOSIS — I059 Rheumatic mitral valve disease, unspecified: Secondary | ICD-10-CM

## 2014-06-20 DIAGNOSIS — M542 Cervicalgia: Secondary | ICD-10-CM

## 2014-06-20 DIAGNOSIS — Z8679 Personal history of other diseases of the circulatory system: Secondary | ICD-10-CM

## 2014-06-20 DIAGNOSIS — I10 Essential (primary) hypertension: Secondary | ICD-10-CM

## 2014-06-20 NOTE — Progress Notes (Signed)
Patient ID: Mary Young, female   DOB: 18-Feb-1941, 73 y.o.   MRN: [id number]    OFFICE NOTE  Chief Complaint:  Patient told her heart "skipped a beat" when she had her colonoscopy  Primary Care Physician: Osborne Casco, MD  HPI:  Mary Young is a 73 yo female presenting for follow-up.  Patient recently had a colonoscopy and was advised that her heart "skipped a beat" while she had this. She notes no symptoms and did not realize this happened. She denies palpitations, chest pain, dyspnea, orthopnea, PND, and syncope. She does note some "dizziness" that she describes as weakness that occurs only when she does not eat. She notes this goes away with eating. Note patient with history of mitral valve prolapse noted in 2010 on echo that was mild in nature.   Hypertension: she notes this has been well controlled on losartan 50 mg daily. She intermittently checks at home and notes her BP is typically in the 120's/70's. She denies chest pain, dyspnea, palpitations, and edema.  Hyperlipidemia: she notes she has been on simvastatin 10 mg every other day. She reports that she has started to have cramps while on this recently. She was previously on simvastatin 40 mg daily, though noted cramps with this so the decreased her dosing.    Neck pain: patient notes sharp pain in right side of her neck that occurs infrequently. She states last episode was over a week ago. She has not taken any medications for this. It does not last very long. She has not discussed this with her PCP.  PMHx:  Past Medical History  Diagnosis Date  . Hypertension   . Glaucoma   . Central retinal vein occlusion   . Joint pain   . Dyslipidemia   . H/O echocardiogram 09/27/2009    Note to have some aortic valve sclerosis, mild mitral valve prolapse, mild mitral regurgitation, and mild asymptomatic LVH with an EF>55%  . History of stress test 09/27/2009    Normal Myocardial perfusion study. No significant  ischemia demonstrated, this low risk scan. compared to the previous study.,there is no significant change, Normal myocardial Perfusion imaging. EF>83%  . Seasonal allergies   . Breast cancer     left lumpectomy 2003  . Hyperlipidemia   . Allergy   . Arthritis   . Cataract     left eye  . GERD (gastroesophageal reflux disease)   . Heart murmur   . Osteoporosis   . Stroke     central retinal occusion - right eye    Past Surgical History  Procedure Laterality Date  . Abdominal hysterectomy  1991  . Breast lumpectomy  2003  . Arthroscopy knee w/ drilling  2005/2008    Left knee/Right knee  . Tubal ligation  1983  . Mastectomy modified radical Right 04/15/2013    Procedure: MASTECTOMY MODIFIED RADICAL;  Surgeon: Adin [name], MD;  Location: [facility];  Service: General;  Laterality: Right;  . Portacath placement Left 05/13/2013    Procedure: INSERTION PORT-A-CATH;  Surgeon: Adin [name], MD;  Location: [facility];  Service: General;  Laterality: Left;    FAMHx:  Family History  Problem Relation Age of Onset  . Cancer Brother     tumor near liver pancrese   half brother  . Colon cancer Neg Hx   . Esophageal cancer Neg Hx   . Pancreatic cancer Neg Hx   . Rectal cancer Neg Hx   . Stomach cancer Neg Hx  SOCHx:   reports that she has never smoked. She has never used smokeless tobacco. She reports that she drinks alcohol. She reports that she does not use illicit drugs.  ALLERGIES:  Allergies  Allergen Reactions  . Vicodin [Hydrocodone-Acetaminophen] Nausea And Vomiting  . Brimonidine Tartrate-Timolol   . Dorzolamide Hcl-Timolol Mal   . Percocet [Oxycodone-Acetaminophen] Nausea And Vomiting  . Precipitated Sulfur [Sulfur] Itching    Eye medication  . Timolol Rash    Eye medication with preservatives    ROS: Pertinent items are noted in HPI.  HOME MEDS: Current Outpatient Prescriptions  Medication Sig Dispense Refill  . anastrozole (ARIMIDEX) 1 MG tablet Take 1 mg  by mouth daily.       Marland Kitchen aspirin 325 MG tablet Take 325 mg by mouth 2 (two) times daily as needed.      Marland Kitchen aspirin 81 MG tablet Take 81 mg by mouth 2 (two) times daily.      . Biotin 1000 MCG tablet Take 1,000 mcg by mouth daily.      . cholecalciferol (VITAMIN D) 400 UNITS TABS tablet Take 400 Units by mouth 2 (two) times daily.      . cyanocobalamin 500 MCG tablet Take 500 mcg by mouth daily.      . dorzolamide (TRUSOPT) 2 % ophthalmic solution Place 1 drop into both eyes 3 (three) times daily.       Marland Kitchen losartan (COZAAR) 50 MG tablet Take 50 mg by mouth daily.      . Multiple Vitamin (MULTI-VITAMIN) tablet Take 1 tablet by mouth.      . simvastatin (ZOCOR) 20 MG tablet Take 20 mg by mouth every other day.       . timolol (TIMOPTIC) 0.5 % ophthalmic solution Place 1 drop into both eyes 2 (two) times daily. Non preservative      . TRAVATAN Z 0.004 % SOLN ophthalmic solution Place 1 drop into both eyes at bedtime.       Marland Kitchen UNABLE TO FIND Rx: L8000- Post Surgical Bras (Quantity: 6) F5732- Silicone Breast Prosthesis (Quantity: 1) Dx: 174.9; Right mastectomy  1 each  0   No current facility-administered medications for this visit.   Facility-Administered Medications Ordered in Other Visits  Medication Dose Route Frequency Provider Last Rate Last Dose  . 0.9 %  sodium chloride infusion   Intravenous Once [name], NP        LABS/IMAGING: No results found for this or any previous visit (from the past 48 hour(s)). No results found.  VITALS: BP 136/64  Pulse 72  Ht 5\' 3"  (1.6 m)  Wt 133 lb (60.328 kg)  BMI 23.57 kg/m2  EXAM: General appearance: alert, cooperative and no distress Neck: no adenopathy, no carotid bruit, no JVD and supple, symmetrical, trachea midline Lungs: clear to auscultation bilaterally Heart: regular rate and rhythm and systolic murmur: systolic ejection 2/6, low pitch at 2nd right intercostal space, click noted in lower left intercostal area Extremities:  extremities normal, atraumatic, no cyanosis or edema Pulses: 2+ radial Skin: Skin color, texture, turgor normal. No rashes or lesions Neurologic: Sensory: normal, intact to light touch in UE Motor: 5/5 strength in bilateral biceps, triceps, and grip MSK: no swelling of the neck, there is tenderness of the mid right trapezius and mild spasm  EKG: NSR, rate of 72  ASSESSMENT: 1. PAC: patient with one PAC on rhythm strip sent from procedure office. EKG today with no abnormalities. She reports no symptoms related to her heart  at this time. Likely related to the patients mitral valve prolapse which places at increased risk for conduction abnormalities and palpitations. Additionally the sedative during the procedure could have contributed to her recent PAC. 2. HTN: is well controlled at this time. 3. HLD: patient appears to be on sub-optimal therapy at this time most likely relating to intolerance of the medication. She appears to potentially have continued intolerance on lower dose of simvastatin. 4. Mitral valve prolapse: could be contributing to PAC at this time. Currently stable and mild on exam. Last echo 2010 with mild prolapse. 5. Neck pain: infrequent and associated with tenderness of mid right trapezius. Likely MSK in origin. No neurological deficits.  PLAN: 1.   Is at increased risk for palpitations given mitral valve prolapse though is asymptomatic at this time, so discussed symptoms to monitor for in the future. At this time we will monitor this issue and patient is to return in one year for follow-up or sooner if develops symptoms. 2.   To continue current dosing of losartan and follow-up with PCP. 3.   Advised to go on a statin holiday for the next 2 weeks. We discussed starting lipitor given need for a statin, though would like to evaluate her lipid profile prior to deciding on a dosage of this medication. She will come back fasting for lipid panel and then lipitor dosing will be  determined. 4.   Will plan for follow-up echo if murmur changes, otherwise every 3-5 years. 5.   Discussed use of massage and exercises with patient. Patient declined use of muscle relaxants. She is to follow-up with her PCP regarding this issue.   Tommi Rumps 06/20/2014, 2:16 PM   Pt. Seen and examined. Agree with the resident note as written. Mrs. Wherley is a pleasant 73 year old female that I saw last in [place] about 2 years ago. She has a history of dyslipidemia as well as mitral valve prolapse and palpitations but has generally been asymptomatic recently with her palpitations. Apparently she had a recent PAC or some abnormalities after her colonoscopy. She was asymptomatic with this but told to followup with cardiology. She's had no chest pain worsening shortness of breath. She has a stable systolic murmur associated with mitral regurgitation. She did have some neck discomfort today which sounds musculoskeletal. She complaining of some cramping in her legs which could be related to her statin. She's had decreasing doses of her statin and now takes it every other day due to muscle pains but has not tried any additional statins other than Crestor, which she tolerated but it was too expensive. She is willing to try another cholesterol medication. I recommended checking a lipid profile and will likely switch her to atorvastatin.  I can see her back annually for ongoing risk factor modification.  Thanks for allowing me to participate in her care.  Pixie Casino, MD, [facility] Attending Cardiologist [place]

## 2014-06-20 NOTE — Patient Instructions (Addendum)
Your physician recommends that you return for lab work at your convenience (you will need to be fasting)  Your physician has recommended you make the following change in your medication: STOP simvastatin  Your physician wants you to follow-up in: 1 year with Dr. Debara Pickett. You will receive a reminder letter in the mail two months in advance. If you don't receive a letter, please call our office to schedule the follow-up appointment.

## 2014-06-21 LAB — LIPID PANEL
Cholesterol: 170 mg/dL (ref 0–200)
HDL: 39 mg/dL — ABNORMAL LOW (ref >39)
LDL Cholesterol: 99 mg/dL (ref 0–99)
Total CHOL/HDL Ratio: 4.4 Ratio
Triglycerides: 162 mg/dL — ABNORMAL HIGH (ref <150)
VLDL: 32 mg/dL (ref 0–40)

## 2014-06-22 ENCOUNTER — Telehealth: Payer: Self-pay | Admitting: *Deleted

## 2014-06-22 MED ORDER — ATORVASTATIN CALCIUM 20 MG PO TABS: 20.0000 mg | ORAL_TABLET | Freq: Every day | ORAL | Status: DC

## 2014-06-22 NOTE — Telephone Encounter (Signed)
Left VM with lab results information and instructions to start atorvastatin 20mg  once daily. Informed her in VM that results and med info was also release to MyChart.

## 2014-06-22 NOTE — Telephone Encounter (Signed)
Message copied by Fidel Levy on Thu Jun 22, 2014 10:24 AM ------      Message from: Pixie Casino      Created: Wed Jun 21, 2014  2:42 PM       Cholesterol control looks pretty good. Would recommend switching to atorvastatin 20 mg daily (since she is having myalgias with zocor and taking every other day).            Dr. Lemmie Evens ------

## 2014-06-27 ENCOUNTER — Ambulatory Visit (INDEPENDENT_AMBULATORY_CARE_PROVIDER_SITE_OTHER): Payer: Medicare Other | Admitting: General Surgery

## 2014-06-27 ENCOUNTER — Encounter (INDEPENDENT_AMBULATORY_CARE_PROVIDER_SITE_OTHER): Payer: Self-pay | Admitting: General Surgery

## 2014-06-27 ENCOUNTER — Other Ambulatory Visit (INDEPENDENT_AMBULATORY_CARE_PROVIDER_SITE_OTHER): Payer: Self-pay

## 2014-06-27 VITALS — BP 130/62 | HR 63 | Temp 98.1°F | Ht 63.0 in | Wt 134.0 lb

## 2014-06-27 DIAGNOSIS — C50419 Malignant neoplasm of upper-outer quadrant of unspecified female breast: Secondary | ICD-10-CM

## 2014-06-27 DIAGNOSIS — C50411 Malignant neoplasm of upper-outer quadrant of right female breast: Secondary | ICD-10-CM

## 2014-06-27 NOTE — Progress Notes (Signed)
Patient ID: Mary Young, female   DOB: 1940/12/25, 73 y.o.   MRN: [id number]  History:  This patient presents for long-term followup regarding her bilateral breast cancer.     This patient initially presented April, 2014 with a large tumor in her right breast, upper outer quadrant. Imaging studies showed a 5.2 cm tumor and a 2.0 cm right axillary lymph node. 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       She has a past history of left partial mastectomy and sentinel node biopsy and radiation therapy in [facility]. His tumor might have been microinvasive, node negative disease. She has no evidence of recurrence there.      She completed a course of neoadjuvant letrozole here. . MRI performed on 03/22/2013 showed minimal response of her right breast cancer, still 4.5 cm. .       On 04/15/2013 she underwent right modified radical mastectomy. She recovered uneventfully.  She received chest wall radiation therapy.  She notices some numbness of the upper inner right arm but no swelling and no disability. I placed a Port-A-Cath on 05/13/2013. She has completed chemotherapy and port was subsequrently removed. .  Exam:   Neck reveals no adenopathy or mass Lungs are clear to auscultation bilaterally Heart reveals a regular rate and rhythm. No murmur. No ectopy  Right mastectomy wound has healed uneventfully.  There is no evidence of fluid. Range of motion right shoulder is excellent. No arm swelling No nodules. No ulceration. No adenopathy. Left breast reveals lumpectomy scar inferior medially well healed. No palpable mass the left breast. No left axillary adenopathy.  Assessment:  Locally advanced, invasive mammary carcinoma right breast, central and upper outer quadrant, receptor positive, HER-2-negative, pretreatment clinical stage T3,N1; Final stage ypT2, ypN2.   Recovered uneventfully following right modified radical mastectomy  No evidence of recurrence one year and 3 months postop  Plan:  The patient will be referred back to the [facility] for reassignment to another oncologist now Continue anastrozole Left breast mammogram, in February 2016 Return to see me in one year    Devonna Oboyle M. Dalbert Batman, M.D., [facility]  General and Minimally invasive Surgery  Breast and Colorectal Surgery  Office: [phone]  Pager: [phone]

## 2014-06-27 NOTE — Patient Instructions (Signed)
Examination of your right mastectomy site, left breast, and all of the regional lymph nodes is normal. Your mammograms in January are normal. There is no evidence of cancer.  Continue to take the anastrozole medication  You will be referred to a new medical oncologist who should see you every 6 months because of your breast cancer and the use of  Anastrozole.  Obtain a left breast mammogram in February 2016  Return to see Dr. Dalbert Batman in one year

## 2014-08-22 ENCOUNTER — Telehealth: Payer: Self-pay | Admitting: *Deleted

## 2014-08-22 NOTE — Telephone Encounter (Signed)
Called pt to confirm f/u appt with Mary Young on 08/31/14. Pt denies further needs at this time.

## 2014-08-31 ENCOUNTER — Encounter: Payer: Self-pay | Admitting: Adult Health

## 2014-08-31 ENCOUNTER — Ambulatory Visit (HOSPITAL_BASED_OUTPATIENT_CLINIC_OR_DEPARTMENT_OTHER): Payer: Medicare Other | Admitting: Adult Health

## 2014-08-31 ENCOUNTER — Other Ambulatory Visit (HOSPITAL_BASED_OUTPATIENT_CLINIC_OR_DEPARTMENT_OTHER): Payer: Medicare Other

## 2014-08-31 ENCOUNTER — Telehealth: Payer: Self-pay | Admitting: Adult Health

## 2014-08-31 VITALS — BP 145/81 | HR 72 | Temp 98.0°F | Resp 20 | Ht 63.0 in | Wt 129.6 lb

## 2014-08-31 DIAGNOSIS — C773 Secondary and unspecified malignant neoplasm of axilla and upper limb lymph nodes: Secondary | ICD-10-CM

## 2014-08-31 DIAGNOSIS — C50411 Malignant neoplasm of upper-outer quadrant of right female breast: Secondary | ICD-10-CM

## 2014-08-31 DIAGNOSIS — E2839 Other primary ovarian failure: Secondary | ICD-10-CM

## 2014-08-31 DIAGNOSIS — Z853 Personal history of malignant neoplasm of breast: Secondary | ICD-10-CM

## 2014-08-31 DIAGNOSIS — Z170 Estrogen receptor positive status [ER+]: Secondary | ICD-10-CM

## 2014-08-31 LAB — COMPREHENSIVE METABOLIC PANEL (CC13)
ALT: 13 U/L (ref 0–55)
AST: 18 U/L (ref 5–34)
Albumin: 3.6 g/dL (ref 3.5–5.0)
Alkaline Phosphatase: 93 U/L (ref 40–150)
Anion Gap: 9 mEq/L (ref 3–11)
BUN: 15.5 mg/dL (ref 7.0–26.0)
CO2: 27 mEq/L (ref 22–29)
Calcium: 9.7 mg/dL (ref 8.4–10.4)
Chloride: 107 mEq/L (ref 98–109)
Creatinine: 0.7 mg/dL (ref 0.6–1.1)
Glucose: 95 mg/dl (ref 70–140)
Potassium: 3.2 mEq/L — ABNORMAL LOW (ref 3.5–5.1)
Sodium: 143 mEq/L (ref 136–145)
Total Bilirubin: 0.86 mg/dL (ref 0.20–1.20)
Total Protein: 6.8 g/dL (ref 6.4–8.3)

## 2014-08-31 LAB — CBC WITH DIFFERENTIAL/PLATELET
BASO%: 0.8 % (ref 0.0–2.0)
Basophils Absolute: 0 10*3/uL (ref 0.0–0.1)
EOS%: 2.1 % (ref 0.0–7.0)
Eosinophils Absolute: 0.1 10*3/uL (ref 0.0–0.5)
HCT: 40.4 % (ref 34.8–46.6)
HGB: 13.4 g/dL (ref 11.6–15.9)
LYMPH%: 25.8 % (ref 14.0–49.7)
MCH: 32 pg (ref 25.1–34.0)
MCHC: 33.1 g/dL (ref 31.5–36.0)
MCV: 96.6 fL (ref 79.5–101.0)
MONO#: 0.4 10*3/uL (ref 0.1–0.9)
MONO%: 11.2 % (ref 0.0–14.0)
NEUT#: 2.2 10*3/uL (ref 1.5–6.5)
NEUT%: 60.1 % (ref 38.4–76.8)
Platelets: 220 10*3/uL (ref 145–400)
RBC: 4.18 10*6/uL (ref 3.70–5.45)
RDW: 12.4 % (ref 11.2–14.5)
WBC: 3.6 10*3/uL — ABNORMAL LOW (ref 3.9–10.3)
lymph#: 0.9 10*3/uL (ref 0.9–3.3)

## 2014-08-31 NOTE — Telephone Encounter (Signed)
, °

## 2014-08-31 NOTE — Patient Instructions (Signed)
You are doing well.  You have no sign of recurrence.  I recommend healthy diet, exercise, and monthly breast exams.  You will need a bone density and I have ordered this.    Breast Self-Awareness Practicing breast self-awareness may pick up problems early, prevent significant medical complications, and possibly save your life. By practicing breast self-awareness, you can become familiar with how your breasts look and feel and if your breasts are changing. This allows you to notice changes early. It can also offer you some reassurance that your breast health is good. One way to learn what is normal for your breasts and whether your breasts are changing is to do a breast self-exam. If you find a lump or something that was not present in the past, it is best to contact your caregiver right away. Other findings that should be evaluated by your caregiver include nipple discharge, especially if it is bloody; skin changes or reddening; areas where the skin seems to be pulled in (retracted); or new lumps and bumps. Breast pain is seldom associated with cancer (malignancy), but should also be evaluated by a caregiver. HOW TO PERFORM A BREAST SELF-EXAM The best time to examine your breasts is 5-7 days after your menstrual period is over. During menstruation, the breasts are lumpier, and it may be more difficult to pick up changes. If you do not menstruate, have reached menopause, or had your uterus removed (hysterectomy), you should examine your breasts at regular intervals, such as monthly. If you are breastfeeding, examine your breasts after a feeding or after using a breast pump. Breast implants do not decrease the risk for lumps or tumors, so continue to perform breast self-exams as recommended. Talk to your caregiver about how to determine the difference between the implant and breast tissue. Also, talk about the amount of pressure you should use during the exam. Over time, you will become more familiar with the  variations of your breasts and more comfortable with the exam. A breast self-exam requires you to remove all your clothes above the waist. 1. Look at your breasts and nipples. Stand in front of a mirror in a room with good lighting. With your hands on your hips, push your hands firmly downward. Look for a difference in shape, contour, and size from one breast to the other (asymmetry). Asymmetry includes puckers, dips, or bumps. Also, look for skin changes, such as reddened or scaly areas on the breasts. Look for nipple changes, such as discharge, dimpling, repositioning, or redness. 2. Carefully feel your breasts. This is best done either in the shower or tub while using soapy water or when flat on your back. Place the arm (on the side of the breast you are examining) above your head. Use the pads (not the fingertips) of your three middle fingers on your opposite hand to feel your breasts. Start in the underarm area and use  inch (2 cm) overlapping circles to feel your breast. Use 3 different levels of pressure (light, medium, and firm pressure) at each circle before moving to the next circle. The light pressure is needed to feel the tissue closest to the skin. The medium pressure will help to feel breast tissue a little deeper, while the firm pressure is needed to feel the tissue close to the ribs. Continue the overlapping circles, moving downward over the breast until you feel your ribs below your breast. Then, move one finger-width towards the center of the body. Continue to use the  inch (2 cm)  overlapping circles to feel your breast as you move slowly up toward the collar bone (clavicle) near the base of the neck. Continue the up and down exam using all 3 pressures until you reach the middle of the chest. Do this with each breast, carefully feeling for lumps or changes. 3.  Keep a written record with breast changes or normal findings for each breast. By writing this information down, you do not need to  depend only on memory for size, tenderness, or location. Write down where you are in your menstrual cycle, if you are still menstruating. Breast tissue can have some lumps or thick tissue. However, see your caregiver if you find anything that concerns you.  SEEK MEDICAL CARE IF:  You see a change in shape, contour, or size of your breasts or nipples.   You see skin changes, such as reddened or scaly areas on the breasts or nipples.   You have an unusual discharge from your nipples.   You feel a new lump or unusually thick areas.  Document Released: 11/03/2005 Document Revised: 10/20/2012 Document Reviewed: 02/18/2012 [facility] Patient Information 2015 [place], [place]. This information is not intended to replace advice given to you by your health care provider. Make sure you discuss any questions you have with your health care provider.

## 2014-08-31 NOTE — Progress Notes (Signed)
Hematology and Oncology Follow Up Visit  Mary Young [id number] May 09, 1941 73 y.o. 08/31/2014 10:51 AM     Principle Diagnosis:Mary Young 73 y.o. 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     Prior Therapy: #1 patient underwent a screening mammogram in September 2013 that showed a right breast mass with associated calcifications. On MRI the mass measured 4.7 x 3.6 x 5.2 cm lymph node measuring 1.5 x 1.0 x 2.0 cm. Patient had a needle core biopsy performed that showed invasive mammary carcinoma ER positive PR positive HER-2/neu negative with Ki-67 elevated at 66%. The lymph node was biopsied and was consistent with metastatic ductal carcinoma.   #2 patient was seen in the multidisciplinary breast clinic for discussion of treatment options. She had a Oncotype DX testing performed on the biopsied tissue her recurrence score was 28 giving her 18% risk of distant recurrence with antiestrogen therapy. She was in the intermediate risk category. Patient was enrolled on neoadjuvant antiestrogen treatment clinical trial.   #3 she is status post neoadjuvant letrozole 2.5 mg starting in November 2013 - June 2014.   #4 patient also has prior history of left lumpectomy and radiation for DCIS treated in 2003. This tumor was apparently ER and PR negative so she did not receive adjuvant tamoxifen.   #5 patient is now status post right modified radical mastectomy with axillary lymph node dissection. The final pathology revealed 4.3 cm invasive ductal carcinoma 5 lymph nodes were positive for metastatic disease. Tumor ER +100% PR negative Ki-67 66%. Pathologic staging T2 N2.   #6 s/p CMF every 21 days adjuvantly from 05/23/2013-08/30/2013   #7 status post adjuvant radiation therapy to the right chest wall administered by Dr. Thea Silversmith between 09/22/13 - 11/11/13   #8 Begin arimidex 1 milligram daily adjuvantly with curative intent for duration of  5-7 years beginning 11/29/2013  Current therapy:  Arimidex daily  Interim History: Mary Young 73 y.o. female with stage III ER/PR positive invasive mammary carcinoma is here for f/u and evaluation.  She is doing well today.  She is taking a Zpak currently for a recent sinusitis and is almost complete.  She is taking Arimidex daily at night, and is tolerating it well.  She has occasional joint aches and pain, hot flashes.  These are tolerable.   She has dry eyes that she attributes to her glaucoma.  She denies any new pain, bowel/bladder changes, chest pain, shortness of breath, or any further concerns.   Medications:  Current Outpatient Prescriptions  Medication Sig Dispense Refill  . acetaZOLAMIDE (DIAMOX) 250 MG tablet Take 250 mg by mouth 3 (three) times daily.      Marland Kitchen anastrozole (ARIMIDEX) 1 MG tablet Take 1 mg by mouth daily.       Marland Kitchen aspirin 325 MG tablet Take 325 mg by mouth 2 (two) times daily as needed.      Marland Kitchen aspirin 81 MG tablet Take 81 mg by mouth 2 (two) times daily.      Marland Kitchen atorvastatin (LIPITOR) 20 MG tablet Take 1 tablet (20 mg total) by mouth daily.  30 tablet  6  . Biotin 1000 MCG tablet Take 1,000 mcg by mouth daily.      . cholecalciferol (VITAMIN D) 400 UNITS TABS tablet Take 400 Units by mouth 2 (two) times daily.      . cyanocobalamin 500 MCG tablet Take 500 mcg by mouth daily.      Marland Kitchen losartan (  COZAAR) 50 MG tablet Take 50 mg by mouth daily.      . Multiple Vitamin (MULTI-VITAMIN) tablet Take 1 tablet by mouth.      Marland Kitchen UNABLE TO FIND Rx: L8000- Post Surgical Bras (Quantity: 6) G2542- Silicone Breast Prosthesis (Quantity: 1) Dx: 174.9; Right mastectomy  1 each  0  . dorzolamide (TRUSOPT) 2 % ophthalmic solution Place 1 drop into both eyes 3 (three) times daily.       Derrill Memo ON 09/12/2014] pilocarpine (PILOCAR) 1 % ophthalmic solution Place 1 drop into both eyes 4 (four) times daily. Eye surgery scheduled for 09/12/14      . timolol (TIMOPTIC) 0.5 % ophthalmic  solution Place 1 drop into both eyes 2 (two) times daily. Non preservative      . TRAVATAN Z 0.004 % SOLN ophthalmic solution Place 1 drop into both eyes at bedtime.        No current facility-administered medications for this visit.   Facility-Administered Medications Ordered in Other Visits  Medication Dose Route Frequency Provider Last Rate Last Dose  . 0.9 %  sodium chloride infusion   Intravenous Once [name], NP         Allergies:  Allergies  Allergen Reactions  . Vicodin [Hydrocodone-Acetaminophen] Nausea And Vomiting  . Brimonidine Tartrate-Timolol   . Dorzolamide Hcl-Timolol Mal   . Percocet [Oxycodone-Acetaminophen] Nausea And Vomiting  . Precipitated Sulfur [Sulfur] Itching    Eye medication  . Timolol Rash    Eye medication with preservatives    Medical History: Past Medical History  Diagnosis Date  . Hypertension   . Glaucoma   . Central retinal vein occlusion   . Joint pain   . Dyslipidemia   . H/O echocardiogram 09/27/2009    Note to have some aortic valve sclerosis, mild mitral valve prolapse, mild mitral regurgitation, and mild asymptomatic LVH with an EF>55%  . History of stress test 09/27/2009    Normal Myocardial perfusion study. No significant ischemia demonstrated, this low risk scan. compared to the previous study.,there is no significant change, Normal myocardial Perfusion imaging. EF>83%  . Seasonal allergies   . Breast cancer     left lumpectomy 2003  . Hyperlipidemia   . Allergy   . Arthritis   . Cataract     left eye  . GERD (gastroesophageal reflux disease)   . Heart murmur   . Osteoporosis   . 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 Abdominal hysterectomy  1991  . Breast lumpectomy  2003  . Arthroscopy knee w/ drilling  2005/2008    Left knee/Right knee  . Tubal ligation  1983  . Mastectomy modified radical Right 04/15/2013    Procedure:  MASTECTOMY MODIFIED RADICAL;  Surgeon: Adin [name], MD;  Location: [facility];  Service: General;  Laterality: Right;  . Portacath placement Left 05/13/2013    Procedure: INSERTION PORT-A-CATH;  Surgeon: Adin [name], MD;  Location: [facility];  Service: General;  Laterality: Left;     Review of Systems: A 10 point review of systems was conducted and is otherwise negative except for what is noted above.    Health Maintenance  Mammogram: 12/20/13 Bone Density Scan:  04/02/12 Vitamin D level: pending Colonoscopy: 05/22/2014 by Dr. Deatra Ina, 5 year f/u recommended    Physical Exam: Blood pressure 145/81, pulse 72, temperature 98 F (36.7 C), temperature source Oral, resp.  rate 20, height '5\' 3"'  (1.6 m), weight 129 lb 9.6 oz (58.786 kg). GENERAL: Patient is a well appearing older female in no acute distress HEENT:  Sclerae anicteric.  Oropharynx clear and moist. No ulcerations or evidence of oropharyngeal candidiasis. Neck is supple.  NODES:  No cervical, supraclavicular, or axillary lymphadenopathy palpated.  BREAST EXAM: right mastectomy site without nodularity or sign of recurrence.  Left breast no mass, nodule, skin change, or lesion.   LUNGS:  Clear to auscultation bilaterally.  No wheezes or rhonchi. HEART:  Regular rate and rhythm. No murmur appreciated. ABDOMEN:  Soft, nontender.  Positive, normoactive bowel sounds. No organomegaly palpated. MSK:  No focal spinal tenderness to palpation. Full range of motion bilaterally in the upper extremities. EXTREMITIES:  No peripheral edema.   SKIN:  Clear with no obvious rashes or skin changes. No nail dyscrasia. NEURO:  Nonfocal. Well oriented.  Appropriate affect. ECOG PERFORMANCE STATUS: 1 - Symptomatic but completely ambulatory   Lab Results: Lab Results  Component Value Date   WBC 3.6* 08/31/2014   HGB 13.4 08/31/2014   HCT 40.4 08/31/2014   MCV 96.6 08/31/2014   PLT 220 08/31/2014     Chemistry      Component Value Date/Time   NA  140 03/01/2014 1318   NA 143 05/11/2013 0940   K 4.0 03/01/2014 1318   K 3.8 05/11/2013 0940   CL 106 05/11/2013 0940   CL 107 03/22/2013 0811   CO2 28 03/01/2014 1318   CO2 27 05/11/2013 0940   BUN 14.4 03/01/2014 1318   BUN 14 05/11/2013 0940   CREATININE 0.7 03/01/2014 1318   CREATININE 0.60 05/11/2013 0940      Component Value Date/Time   CALCIUM 9.5 03/01/2014 1318   CALCIUM 9.2 05/11/2013 0940   ALKPHOS 87 03/01/2014 1318   ALKPHOS 69 04/08/2013 0942   AST 21 03/01/2014 1318   AST 22 04/08/2013 0942   ALT 17 03/01/2014 1318   ALT 20 04/08/2013 0942   BILITOT 0.64 03/01/2014 1318   BILITOT 0.6 04/08/2013 0942     Assessment and Plan: Mary Young 73 y.o. female with  1. Stage III ER/PR positive invasive mammary carcinoma of the right breast.  The patient is doing well today and has no sign of recurrence. She will continue to take Arimidex daily.  She is tolerating it moderately well.  Due to the indeterminate hypermetabolic lesion on the liver seen on her PET scan in 2013 during her staging.  An MRI abdomen was done in 02/2014 and it was normal.  A bone scan was also done in 02/2014 and it demonstrated degeneration.  She has no sign of recurrence today.  She will continue taking Arimidex daily.    2.  Survivorship.  She is due for a bone density and I will order this today.  I recommended healthy diet, exercise and monthly breast exams.    She will return in 6 months for labs and evaluation.   She knows to call us in the interim for any questions or concerns.  We can certainly see her sooner if needed.   I spent 25 minutes counseling the patient face to face.  The total time spent in the appointment was 30 minutes.  [name], NP Medical Oncology [facility] [phone] 08/31/2014 10:51 AM  Attending Note  I personally saw and examined Mary Young. The plan of care was discussed with her. I agree with the assessment and plan  as documented above. Stage 3  breast cancer:continue arimidex Signed Rulon Eisenmenger, MD

## 2014-09-01 LAB — VITAMIN D 25 HYDROXY (VIT D DEFICIENCY, FRACTURES): Vit D, 25-Hydroxy: 69 ng/mL (ref 30–89)

## 2014-09-18 ENCOUNTER — Encounter: Payer: Self-pay | Admitting: Adult Health

## 2014-09-27 ENCOUNTER — Ambulatory Visit
Admission: RE | Admit: 2014-09-27 | Discharge: 2014-09-27 | Disposition: A | Discharge status: Home / Self Care | Payer: Medicare Other | Source: Ambulatory Visit | Attending: Adult Health | Admitting: Adult Health

## 2014-09-27 DIAGNOSIS — E2839 Other primary ovarian failure: Secondary | ICD-10-CM

## 2014-09-28 ENCOUNTER — Telehealth: Payer: Self-pay | Admitting: *Deleted

## 2014-09-28 NOTE — Telephone Encounter (Signed)
Pt notified of results to bone density scan. Pt encouraged to start taking calcium and vit D and to start doing weight bearing exercises.  Pt verbalizes understanding.

## 2014-10-13 ENCOUNTER — Encounter: Payer: Self-pay | Admitting: Adult Health

## 2014-11-20 ENCOUNTER — Other Ambulatory Visit: Payer: Self-pay

## 2014-11-20 DIAGNOSIS — Z1231 Encounter for screening mammogram for malignant neoplasm of breast: Secondary | ICD-10-CM

## 2014-12-26 ENCOUNTER — Ambulatory Visit
Admission: RE | Admit: 2014-12-26 | Discharge: 2014-12-26 | Disposition: A | Discharge status: Home / Self Care | Payer: Medicare Other | Source: Ambulatory Visit

## 2014-12-26 DIAGNOSIS — Z1231 Encounter for screening mammogram for malignant neoplasm of breast: Secondary | ICD-10-CM

## 2015-02-06 ENCOUNTER — Telehealth: Payer: Self-pay | Admitting: Hematology and Oncology

## 2015-02-06 NOTE — Telephone Encounter (Signed)
Called patient and her appt has been r/s due to dr Lindi Adie out o office   anne

## 2015-02-25 ENCOUNTER — Other Ambulatory Visit: Payer: Self-pay | Admitting: Oncology

## 2015-02-26 ENCOUNTER — Other Ambulatory Visit: Payer: Self-pay

## 2015-02-26 DIAGNOSIS — C50419 Malignant neoplasm of upper-outer quadrant of unspecified female breast: Secondary | ICD-10-CM

## 2015-02-26 MED ORDER — ANASTROZOLE 1 MG PO TABS: 1.0000 mg | ORAL_TABLET | Freq: Every day | ORAL | Status: DC

## 2015-02-26 NOTE — Progress Notes (Signed)
Last OV 08/31/14.  Next OV 03/01/15.  Chart reviewed.

## 2015-02-28 ENCOUNTER — Other Ambulatory Visit: Payer: Self-pay | Admitting: *Deleted

## 2015-02-28 DIAGNOSIS — C50419 Malignant neoplasm of upper-outer quadrant of unspecified female breast: Secondary | ICD-10-CM

## 2015-03-01 ENCOUNTER — Other Ambulatory Visit (HOSPITAL_BASED_OUTPATIENT_CLINIC_OR_DEPARTMENT_OTHER): Payer: Medicare Other

## 2015-03-01 ENCOUNTER — Ambulatory Visit (HOSPITAL_BASED_OUTPATIENT_CLINIC_OR_DEPARTMENT_OTHER): Payer: Medicare Other | Admitting: Hematology and Oncology

## 2015-03-01 ENCOUNTER — Telehealth: Payer: Self-pay | Admitting: Hematology and Oncology

## 2015-03-01 VITALS — BP 135/67 | HR 68 | Temp 98.3°F | Resp 18 | Ht 63.0 in | Wt 130.8 lb

## 2015-03-01 DIAGNOSIS — Z170 Estrogen receptor positive status [ER+]: Secondary | ICD-10-CM | POA: Diagnosis not present

## 2015-03-01 DIAGNOSIS — C50411 Malignant neoplasm of upper-outer quadrant of right female breast: Secondary | ICD-10-CM

## 2015-03-01 DIAGNOSIS — C773 Secondary and unspecified malignant neoplasm of axilla and upper limb lymph nodes: Secondary | ICD-10-CM

## 2015-03-01 DIAGNOSIS — C50419 Malignant neoplasm of upper-outer quadrant of unspecified female breast: Secondary | ICD-10-CM

## 2015-03-01 LAB — COMPREHENSIVE METABOLIC PANEL (CC13)
ALT: 21 U/L (ref 0–55)
AST: 22 U/L (ref 5–34)
Albumin: 3.7 g/dL (ref 3.5–5.0)
Alkaline Phosphatase: 86 U/L (ref 40–150)
Anion Gap: 7 mEq/L (ref 3–11)
BUN: 22.4 mg/dL (ref 7.0–26.0)
CO2: 31 mEq/L — ABNORMAL HIGH (ref 22–29)
Calcium: 9.2 mg/dL (ref 8.4–10.4)
Chloride: 106 mEq/L (ref 98–109)
Creatinine: 0.7 mg/dL (ref 0.6–1.1)
EGFR: 82 mL/min/{1.73_m2} — ABNORMAL LOW (ref >90)
Glucose: 102 mg/dl (ref 70–140)
Potassium: 4 mEq/L (ref 3.5–5.1)
Sodium: 144 mEq/L (ref 136–145)
Total Bilirubin: 0.44 mg/dL (ref 0.20–1.20)
Total Protein: 6.1 g/dL — ABNORMAL LOW (ref 6.4–8.3)

## 2015-03-01 LAB — CBC WITH DIFFERENTIAL/PLATELET
BASO%: 0.7 % (ref 0.0–2.0)
Basophils Absolute: 0 10*3/uL (ref 0.0–0.1)
EOS%: 1.9 % (ref 0.0–7.0)
Eosinophils Absolute: 0.1 10*3/uL (ref 0.0–0.5)
HCT: 37.1 % (ref 34.8–46.6)
HGB: 12.4 g/dL (ref 11.6–15.9)
LYMPH%: 32.1 % (ref 14.0–49.7)
MCH: 31.6 pg (ref 25.1–34.0)
MCHC: 33.4 g/dL (ref 31.5–36.0)
MCV: 94.7 fL (ref 79.5–101.0)
MONO#: 0.3 10*3/uL (ref 0.1–0.9)
MONO%: 7.6 % (ref 0.0–14.0)
NEUT#: 2.3 10*3/uL (ref 1.5–6.5)
NEUT%: 57.7 % (ref 38.4–76.8)
Platelets: 214 10*3/uL (ref 145–400)
RBC: 3.92 10*6/uL (ref 3.70–5.45)
RDW: 12.4 % (ref 11.2–14.5)
WBC: 4 10*3/uL (ref 3.9–10.3)
lymph#: 1.3 10*3/uL (ref 0.9–3.3)

## 2015-03-01 NOTE — Assessment & Plan Note (Signed)
Stage IIIa ER/PR positive HER-2 negative invasive mammary cancer right breast status post few months of neoadjuvant antiestrogen therapy followed by mastectomy followed by adjuvant chemotherapy and radiation currently on anastrozole 1 mg daily, and indeterminate liver lesion seen on initial PET CT scan was followed up and was found to be normal in April 2015.  Anastrozole toxicities: 1. Occasional hot flashes 2. Mild musculoskeletal aches and pains 3. Bone density November 2015 showed osteopenia -1.3  Breast cancer surveillance: 1. Mammogram done February 2016 was normal the left breast 2. Breast and chest wall exam 03/01/2015 is normal  Survivorship:Discussed the importance of physical exercise in decreasing the likelihood of breast cancer recurrence. Recommended 30 mins daily 6 days a week of either brisk walking or cycling or swimming. Encouraged patient to eat more fruits and vegetables and decrease red meat.   Return to clinic in 6 months for follow-up. I discussed with her that based on NCCN guidelines is no role of routine blood work for breast cancer surveillance. We will do blood work only once a year. I instructed her to follow with her primary care physician for all of her routine surveillance and preventive care.

## 2015-03-01 NOTE — Progress Notes (Signed)
Patient Care Team: Kelton Pillar, MD as PCP - General (Family Medicine) Consuela Mimes, MD as Consulting Physician (Hematology and Oncology)  DIAGNOSIS: Primary cancer of upper outer quadrant of right female breast   Staging form: Breast, AJCC 7th Edition     Clinical: Stage IIIA (T3, N1, cM0) - Unsigned       Staging comments: Staged at breast conference 10.23.13      Pathologic: No stage assigned - Unsigned   SUMMARY OF ONCOLOGIC HISTORY:   Primary cancer of upper outer quadrant of right female breast   08/30/2002 Surgery Left lumpectomy and radiation showed DCIS ER/PR negative   08/04/2012 Initial Diagnosis Right breast mass 4.7 x 3.6 x 5.2, lymph node measuring 2 cm, invasive mammary cancer ER/PR positive HER-2 negative Ki-67 66%, lymph node positive , Oncotype DX score 28, 18% ROR   09/30/2012 - 04/30/2013 Anti-estrogen oral therapy Neoadjuvant letrozole 2.5 mg daily   04/15/2013 Surgery Right breast mastectomy: Residual invasive ductal carcinoma grade 2, 4.3 cm, intermediate grade DCIS, angiolymphatic invasion, 5/23 lymph nodes positive, T2 N2 M0 stage IIIa ER 100% PR 0% HER-2 negative ratio 0.91   05/23/2013 - 08/30/2013 Chemotherapy Adjuvant chemotherapy CMF   09/22/2013 - 11/11/2013 Radiation Therapy Adjuvant radiation therapy   11/29/2013 -  Anti-estrogen oral therapy Arimidex 1 mg daily    CHIEF COMPLIANT: Follow-up on Arimidex for breast cancer  INTERVAL HISTORY: Mary Young is a 74 year old with above-mentioned history of right-sided stage III breast cancer treated with mastectomy followed by adjuvant chemotherapy and radiation is currently on Arimidex. She is tolerating it fairly well without any major problems or concerns. Occasional hot flashes and muscle aches. Sensitivity in the right chest wall and numbness related to prior surgery. Denies any lumps in his breasts.  REVIEW OF SYSTEMS:   Constitutional: Denies fevers, chills or abnormal weight loss Eyes: Denies  blurriness of vision Ears, nose, mouth, throat, and face: Denies mucositis or sore throat Respiratory: Denies cough, dyspnea or wheezes Cardiovascular: Denies palpitation, chest discomfort or lower extremity swelling Gastrointestinal:  Denies nausea, heartburn or change in bowel habits Skin: Denies abnormal skin rashes Lymphatics: Denies new lymphadenopathy or easy bruising Neurological:Denies numbness, tingling or new weaknesses Behavioral/Psych: Mood is stable, no new changes  Breast:  denies any pain or lumps or nodules in either breasts All other systems were reviewed with the patient and are negative.  I have reviewed the past medical history, past surgical history, social history and family history with the patient and they are unchanged from previous note.  ALLERGIES:  is allergic to vicodin; brimonidine tartrate-timolol; dorzolamide hcl-timolol mal; percocet; precipitated sulfur; and timolol.  MEDICATIONS:  Current Outpatient Prescriptions  Medication Sig Dispense Refill  . anastrozole (ARIMIDEX) 1 MG tablet Take 1 tablet (1 mg total) by mouth daily. 90 tablet 1  . aspirin 325 MG tablet Take 325 mg by mouth 2 (two) times daily as needed.    Marland Kitchen aspirin 81 MG tablet Take 81 mg by mouth 2 (two) times daily.    . calcium carbonate (TUMS - DOSED IN MG ELEMENTAL CALCIUM) 500 MG chewable tablet Chew 2 tablets by mouth daily.    . cholecalciferol (VITAMIN D) 400 UNITS TABS tablet Take 400 Units by mouth 2 (two) times daily.    . cyanocobalamin 500 MCG tablet Take 500 mcg by mouth daily.    Marland Kitchen losartan (COZAAR) 50 MG tablet Take 50 mg by mouth daily.    . Multiple Vitamin (MULTI-VITAMIN) tablet Take 1 tablet by mouth.    Marland Kitchen  pilocarpine (PILOCAR) 1 % ophthalmic solution Place 1 drop into both eyes 4 (four) times daily. Eye surgery scheduled for 09/12/14    . timolol (TIMOPTIC) 0.5 % ophthalmic solution Place 1 drop into both eyes 2 (two) times daily. Non preservative    . TRAVATAN Z 0.004 %  SOLN ophthalmic solution Place 1 drop into both eyes at bedtime.     Marland Kitchen UNABLE TO FIND Rx: L8000- Post Surgical Bras (Quantity: 6) S9373- Silicone Breast Prosthesis (Quantity: 1) Dx: 174.9; Right mastectomy 1 each 0   No current facility-administered medications for this visit.   Facility-Administered Medications Ordered in Other Visits  Medication Dose Route Frequency Provider Last Rate Last Dose  . 0.9 %  sodium chloride infusion   Intravenous Once [name], NP        PHYSICAL EXAMINATION: ECOG PERFORMANCE STATUS: 1 - Symptomatic but completely ambulatory  Filed Vitals:   03/01/15 1437  BP: 135/67  Pulse: 68  Temp: 98.3 F (36.8 C)  Resp: 18   Filed Weights   03/01/15 1437  Weight: 130 lb 12.8 oz (59.33 kg)    GENERAL:alert, no distress and comfortable SKIN: skin color, texture, turgor are normal, no rashes or significant lesions EYES: normal, Conjunctiva are pink and non-injected, sclera clear OROPHARYNX:no exudate, no erythema and lips, buccal mucosa, and tongue normal  NECK: supple, thyroid normal size, non-tender, without nodularity LYMPH:  no palpable lymphadenopathy in the cervical, axillary or inguinal LUNGS: clear to auscultation and percussion with normal breathing effort HEART: regular rate & rhythm and no murmurs and no lower extremity edema ABDOMEN:abdomen soft, non-tender and normal bowel sounds Musculoskeletal:no cyanosis of digits and no clubbing  NEURO: alert & oriented x 3 with fluent speech, no focal motor/sensory deficits BREAST: No palpable masses or nodules  in the left breast right chest wall is without any nodularity or lymph nodes. No palpable axillary supraclavicular or infraclavicular adenopathy no breast tenderness or nipple discharge. (exam performed in the presence of a chaperone)  LABORATORY DATA:  I have reviewed the data as listed   Chemistry      Component Value Date/Time   NA 143 08/31/2014 1017   NA 143 05/11/2013 0940   K  3.2* 08/31/2014 1017   K 3.8 05/11/2013 0940   CL 106 05/11/2013 0940   CL 107 03/22/2013 0811   CO2 27 08/31/2014 1017   CO2 27 05/11/2013 0940   BUN 15.5 08/31/2014 1017   BUN 14 05/11/2013 0940   CREATININE 0.7 08/31/2014 1017   CREATININE 0.60 05/11/2013 0940      Component Value Date/Time   CALCIUM 9.7 08/31/2014 1017   CALCIUM 9.2 05/11/2013 0940   ALKPHOS 93 08/31/2014 1017   ALKPHOS 69 04/08/2013 0942   AST 18 08/31/2014 1017   AST 22 04/08/2013 0942   ALT 13 08/31/2014 1017   ALT 20 04/08/2013 0942   BILITOT 0.86 08/31/2014 1017   BILITOT 0.6 04/08/2013 0942       Lab Results  Component Value Date   WBC 4.0 03/01/2015   HGB 12.4 03/01/2015   HCT 37.1 03/01/2015   MCV 94.7 03/01/2015   PLT 214 03/01/2015   NEUTROABS 2.3 03/01/2015     RADIOGRAPHIC STUDIES: I have personally reviewed the radiology reports and agreed with their findings. Mammogram February 2016 is normal on the left breast  ASSESSMENT & PLAN:  Primary cancer of upper outer quadrant of right female breast Stage IIIa ER/PR positive HER-2 negative invasive mammary cancer right  breast status post few months of neoadjuvant antiestrogen therapy followed by mastectomy followed by adjuvant chemotherapy and radiation currently on anastrozole 1 mg daily, and indeterminate liver lesion seen on initial PET CT scan was followed up and was found to be normal in April 2015.  Anastrozole toxicities: 1. Occasional hot flashes 2. Mild musculoskeletal aches and pains 3. Bone density November 2015 showed osteopenia -1.3  Breast cancer surveillance: 1. Mammogram done February 2016 was normal the left breast 2. Breast and chest wall exam 03/01/2015 is normal  Survivorship:Discussed the importance of physical exercise in decreasing the likelihood of breast cancer recurrence. Recommended 30 mins daily 6 days a week of either brisk walking or cycling or swimming. Encouraged patient to eat more fruits and vegetables  and decrease red meat.   Return to clinic in 6 months for follow-up. I discussed with her that based on NCCN guidelines is no role of routine blood work for breast cancer surveillance. We will do blood work only once a year. I instructed her to follow with her primary care physician for all of her routine surveillance and preventive care.      No orders of the defined types were placed in this encounter.   The patient has a good understanding of the overall plan. she agrees with it. She will call with any problems that may develop before her next visit here.   Rulon Eisenmenger, MD

## 2015-03-01 NOTE — Telephone Encounter (Signed)
per pof to sch pt appt-gave pt copy of sch °

## 2015-03-02 ENCOUNTER — Ambulatory Visit: Payer: Medicare Other | Admitting: Hematology and Oncology

## 2015-03-02 ENCOUNTER — Other Ambulatory Visit: Payer: Medicare Other

## 2015-03-10 ENCOUNTER — Encounter: Payer: Self-pay | Admitting: Hematology and Oncology

## 2015-08-12 ENCOUNTER — Other Ambulatory Visit: Payer: Self-pay | Admitting: Hematology and Oncology

## 2015-08-13 ENCOUNTER — Other Ambulatory Visit: Payer: Self-pay | Admitting: *Deleted

## 2015-08-13 DIAGNOSIS — C50419 Malignant neoplasm of upper-outer quadrant of unspecified female breast: Secondary | ICD-10-CM

## 2015-08-13 MED ORDER — ANASTROZOLE 1 MG PO TABS: 1.0000 mg | ORAL_TABLET | Freq: Every day | ORAL | Status: DC

## 2015-09-05 NOTE — Assessment & Plan Note (Signed)
Stage IIIa ER/PR positive HER-2 negative invasive mammary cancer right breast status post few months of neoadjuvant antiestrogen therapy followed by mastectomy followed by adjuvant chemotherapy and radiation currently on anastrozole 1 mg daily, and indeterminate liver lesion seen on initial PET CT scan was followed up and was found to be normal in April 2015.  Anastrozole toxicities: 1. Occasional hot flashes 2. Mild musculoskeletal aches and pains 3. Bone density November 2015 showed osteopenia -1.3  Breast cancer surveillance: 1. Mammogram done February 2016 was normal the left breast 2. Breast and chest wall exam 09/06/2015 is normal  Survivorship:Discussed the importance of physical exercise in decreasing the likelihood of breast cancer recurrence. Recommended 30 mins daily 6 days a week of either brisk walking or cycling or swimming. Encouraged patient to eat more fruits and vegetables and decrease red meat.   Return to clinic in 6 months for follow-up.

## 2015-09-06 ENCOUNTER — Ambulatory Visit (HOSPITAL_BASED_OUTPATIENT_CLINIC_OR_DEPARTMENT_OTHER): Payer: Medicare Other | Admitting: Hematology and Oncology

## 2015-09-06 ENCOUNTER — Telehealth: Payer: Self-pay | Admitting: Hematology and Oncology

## 2015-09-06 ENCOUNTER — Encounter: Payer: Self-pay | Admitting: Hematology and Oncology

## 2015-09-06 VITALS — BP 119/64 | HR 78 | Temp 98.0°F | Resp 18 | Ht 63.0 in | Wt 130.1 lb

## 2015-09-06 DIAGNOSIS — M791 Myalgia: Secondary | ICD-10-CM

## 2015-09-06 DIAGNOSIS — Z170 Estrogen receptor positive status [ER+]: Secondary | ICD-10-CM

## 2015-09-06 DIAGNOSIS — C773 Secondary and unspecified malignant neoplasm of axilla and upper limb lymph nodes: Secondary | ICD-10-CM | POA: Diagnosis not present

## 2015-09-06 DIAGNOSIS — C50411 Malignant neoplasm of upper-outer quadrant of right female breast: Secondary | ICD-10-CM

## 2015-09-06 DIAGNOSIS — M8580 Other specified disorders of bone density and structure, unspecified site: Secondary | ICD-10-CM

## 2015-09-06 NOTE — Addendum Note (Signed)
Addended by: Prentiss Bells on: 09/06/2015 05:35 PM   Modules accepted: Medications

## 2015-09-06 NOTE — Progress Notes (Signed)
Patient Care Team: Kelton Pillar, MD as PCP - General (Family Medicine) Consuela Mimes, MD as Consulting Physician (Hematology and Oncology)  DIAGNOSIS: Primary cancer of upper outer quadrant of right female breast [facility]   Staging form: Breast, AJCC 7th Edition     Clinical: Stage IIIA (T3, N1, cM0) - Unsigned       Staging comments: Staged at breast conference 10.23.13      Pathologic: No stage assigned - Unsigned   SUMMARY OF ONCOLOGIC HISTORY:   Primary cancer of upper outer quadrant of right female breast ([place])   08/30/2002 Surgery Left lumpectomy and radiation showed DCIS ER/PR negative   08/04/2012 Initial Diagnosis Right breast mass 4.7 x 3.6 x 5.2, lymph node measuring 2 cm, invasive mammary cancer ER/PR positive HER-2 negative Ki-67 66%, lymph node positive , Oncotype DX score 28, 18% ROR   09/30/2012 - 04/30/2013 Anti-estrogen oral therapy Neoadjuvant letrozole 2.5 mg daily   04/15/2013 Surgery Right breast mastectomy: Residual invasive ductal carcinoma grade 2, 4.3 cm, intermediate grade DCIS, angiolymphatic invasion, 5/23 lymph nodes positive, T2 N2 M0 stage IIIa ER 100% PR 0% HER-2 negative ratio 0.91   05/23/2013 - 08/30/2013 Chemotherapy Adjuvant chemotherapy [facility]   09/22/2013 - 11/11/2013 Radiation Therapy Adjuvant radiation therapy   11/29/2013 -  Anti-estrogen oral therapy Arimidex 1 mg daily    CHIEF COMPLIANT: complains of groin pain  INTERVAL HISTORY: Mary Young is a 74 year old with above-mentioned history of right breast cancer who had mastectomy followed by adjuvant chemotherapy and radiation and is now on Arimidex therapy. She is tolerating it fairly well. She has very occasional hot flashes. She is complaining of left groin pain. It especially happens at nighttime. Last year she had a bone scan which was normal. She did have arthritis in the scan. She denies any lumps or nodules in the breast. She continues to have some tenderness in the right breast.  REVIEW OF  SYSTEMS:   Constitutional: Denies fevers, chills or abnormal weight loss Eyes: Denies blurriness of vision Ears, nose, mouth, throat, and face: Denies mucositis or sore throat Respiratory: Denies cough, dyspnea or wheezes Cardiovascular: Denies palpitation, chest discomfort or lower extremity swelling Gastrointestinal:  Denies nausea, heartburn or change in bowel habits Skin: Denies abnormal skin rashes Lymphatics: Denies new lymphadenopathy or easy bruising Neurological:Denies numbness, tingling or new weaknesses Behavioral/Psych: Mood is stable, no new changes  Breast: tenderness in the right breast All other systems were reviewed with the patient and are negative.  I have reviewed the past medical history, past surgical history, social history and family history with the patient and they are unchanged from previous note.  ALLERGIES:  is allergic to vicodin; brimonidine tartrate-timolol; dorzolamide hcl-timolol mal; percocet; precipitated sulfur; and timolol.  MEDICATIONS:  Current Outpatient Prescriptions  Medication Sig Dispense Refill  . anastrozole (ARIMIDEX) 1 MG tablet Take 1 tablet (1 mg total) by mouth daily. 90 tablet 3  . aspirin 325 MG tablet Take 325 mg by mouth 2 (two) times daily as needed.    Marland Kitchen aspirin 81 MG tablet Take 81 mg by mouth 2 (two) times daily.    . calcium carbonate (TUMS - DOSED IN MG ELEMENTAL CALCIUM) 500 MG chewable tablet Chew 2 tablets by mouth daily.    . cholecalciferol (VITAMIN D) 400 UNITS TABS tablet Take 400 Units by mouth 2 (two) times daily.    . cyanocobalamin 500 MCG tablet Take 500 mcg by mouth daily.    Marland Kitchen losartan (COZAAR) 50 MG tablet Take  50 mg by mouth daily.    . Multiple Vitamin (MULTI-VITAMIN) tablet Take 1 tablet by mouth.    . pilocarpine (PILOCAR) 1 % ophthalmic solution Place 1 drop into both eyes 4 (four) times daily. Eye surgery scheduled for 09/12/14    . timolol (TIMOPTIC) 0.5 % ophthalmic solution Place 1 drop into both eyes  2 (two) times daily. Non preservative    . TRAVATAN Z 0.004 % SOLN ophthalmic solution Place 1 drop into both eyes at bedtime.     Marland Kitchen UNABLE TO FIND Rx: L8000- Post Surgical Bras (Quantity: 6) J5701- Silicone Breast Prosthesis (Quantity: 1) Dx: 174.9; Right mastectomy 1 each 0   No current facility-administered medications for this visit.   Facility-Administered Medications Ordered in Other Visits  Medication Dose Route Frequency Provider Last Rate Last Dose  . 0.9 %  sodium chloride infusion   Intravenous Once [name], NP        PHYSICAL EXAMINATION: ECOG PERFORMANCE STATUS: 1 - Symptomatic but completely ambulatory  There were no vitals filed for this visit. There were no vitals filed for this visit.  GENERAL:alert, no distress and comfortable SKIN: skin color, texture, turgor are normal, no rashes or significant lesions EYES: normal, Conjunctiva are pink and non-injected, sclera clear OROPHARYNX:no exudate, no erythema and lips, buccal mucosa, and tongue normal  NECK: supple, thyroid normal size, non-tender, without nodularity LYMPH:  no palpable lymphadenopathy in the cervical, axillary or inguinal LUNGS: clear to auscultation and percussion with normal breathing effort HEART: regular rate & rhythm and no murmurs and no lower extremity edema ABDOMEN:abdomen soft, non-tender and normal bowel sounds Musculoskeletal:no cyanosis of digits and no clubbing  NEURO: alert & oriented x 3 with fluent speech, no focal motor/sensory deficits BREAST:right mastectomy scar is normal no palpable lymphadenopathy in the right axilla. Left breast is slightly tender to palpation. No lymphadenopathy is palpable. (exam performed in the presence of a chaperone)  LABORATORY DATA:  I have reviewed the data as listed   Chemistry      Component Value Date/Time   NA 144 03/01/2015 1424   NA 143 05/11/2013 0940   K 4.0 03/01/2015 1424   K 3.8 05/11/2013 0940   CL 106 05/11/2013 0940   CL  107 03/22/2013 0811   CO2 31* 03/01/2015 1424   CO2 27 05/11/2013 0940   BUN 22.4 03/01/2015 1424   BUN 14 05/11/2013 0940   CREATININE 0.7 03/01/2015 1424   CREATININE 0.60 05/11/2013 0940      Component Value Date/Time   CALCIUM 9.2 03/01/2015 1424   CALCIUM 9.2 05/11/2013 0940   ALKPHOS 86 03/01/2015 1424   ALKPHOS 69 04/08/2013 0942   AST 22 03/01/2015 1424   AST 22 04/08/2013 0942   ALT 21 03/01/2015 1424   ALT 20 04/08/2013 0942   BILITOT 0.44 03/01/2015 1424   BILITOT 0.6 04/08/2013 0942       Lab Results  Component Value Date   WBC 4.0 03/01/2015   HGB 12.4 03/01/2015   HCT 37.1 03/01/2015   MCV 94.7 03/01/2015   PLT 214 03/01/2015   NEUTROABS 2.3 03/01/2015   ASSESSMENT & PLAN:  Primary cancer of upper outer quadrant of right female breast Stage IIIa ER/PR positive HER-2 negative invasive mammary cancer right breast status post few months of neoadjuvant antiestrogen therapy followed by mastectomy followed by adjuvant chemotherapy and radiation currently on anastrozole 1 mg daily since January 2015, and indeterminate liver lesion seen on initial PET CT scan was  followed up and was found to be normal in April 2015.  Anastrozole toxicities: 1. Occasional hot flashes 2. Mild musculoskeletal aches and pains 3. Bone density November 2015 showed osteopenia -1.3  Breast cancer surveillance: 1. Mammogram done 12/26/2014 was normal the left breast 2. Breast and chest wall exam 09/06/2015 is normal  Survivorship:encouraged the patient to stay active and eat more fruits and vegetables.  Return to clinic in 1 year for follow-up.    No orders of the defined types were placed in this encounter.   The patient has a good understanding of the overall plan. she agrees with it. she will call with any problems that may develop before the next visit here.   Rulon Eisenmenger, MD 09/06/2015

## 2015-09-06 NOTE — Telephone Encounter (Signed)
Appointments made and avs pritned for pateint °

## 2015-11-26 ENCOUNTER — Ambulatory Visit (HOSPITAL_COMMUNITY): Payer: Medicare Other | Attending: Physician Assistant | Admitting: Physical Therapy

## 2015-11-26 DIAGNOSIS — G479 Sleep disorder, unspecified: Secondary | ICD-10-CM

## 2015-11-26 DIAGNOSIS — R1030 Lower abdominal pain, unspecified: Secondary | ICD-10-CM | POA: Diagnosis present

## 2015-11-26 DIAGNOSIS — M7072 Other bursitis of hip, left hip: Secondary | ICD-10-CM | POA: Diagnosis present

## 2015-11-26 DIAGNOSIS — R29898 Other symptoms and signs involving the musculoskeletal system: Secondary | ICD-10-CM | POA: Diagnosis present

## 2015-11-26 DIAGNOSIS — R1032 Left lower quadrant pain: Secondary | ICD-10-CM

## 2015-11-26 NOTE — Patient Instructions (Signed)
Backward Bend (Standing)    Arch backward to make hollow of back deeper. Hold _2-3___ seconds. Repeat __10__ times per set. Do _1___ sets per session. Do _2___ sessions per day.  http://orth.exer.us/178   Copyright  VHI. All rights reserved.  On Elbows (Prone)    Rise up on elbows as high as possible, keeping hips on floor. Take deep breathes and relax as you exhale. Hold _60___ seconds. Repeat ___2_ times per set. Do __1__ sets per session. Do __2__ sessions per day.  http://orth.exer.us/92   Copyright  VHI. All rights reserved.  Gluteal Sets    Tighten buttocks while pressing pelvis to floor. Hold _3-5___ seconds. Repeat _10___ times per set. Do __1__ sets per session. Do _2___ sessions per day.  http://orth.exer.us/104   Copyright  VHI. All rights reserved.  Iliotibial Band Stretch, Standing    Stand, hands on hips, one leg crossed in front of other leg. Lean to same side as front leg until stretch is felt on other hip. Hold _30__ seconds.  Repeat _3__ times per session. Do _2__ sessions per day.  Copyright  VHI. All rights reserved.

## 2015-11-26 NOTE — Therapy (Signed)
[facility] [place], [place], [address] Phone: [phone]   Fax:  [phone]  Physical Therapy Evaluation  Patient Details  Name: Mary Young MRN: [id number] Date of Birth: 12-29-1940 Referring Provider: Maureen Ralphs  Encounter Date: 11/26/2015      PT End of Session - 11/26/15 1105    Visit Number 1   Number of Visits 4   Date for PT Re-Evaluation 12/26/15   Authorization Type [employer]-medicare   PT Start Time 1015   PT Stop Time 1105   PT Time Calculation (min) 50 min   Equipment Utilized During Treatment Gait belt   Activity Tolerance Patient tolerated treatment well   Behavior During Therapy [facility] for tasks assessed/performed      Past Medical History  Diagnosis Date  . Hypertension   . Glaucoma   . Central retinal vein occlusion   . Joint pain   . Dyslipidemia   . H/O echocardiogram 09/27/2009    Note to have some aortic valve sclerosis, mild mitral valve prolapse, mild mitral regurgitation, and mild asymptomatic LVH with an EF>55%  . History of stress test 09/27/2009    Normal Myocardial perfusion study. No significant ischemia demonstrated, this low risk scan. compared to the previous study.,there is no significant change, Normal myocardial Perfusion imaging. EF>83%  . Seasonal allergies   . Breast cancer ([place])     left lumpectomy 2003  . Hyperlipidemia   . Allergy   . Arthritis   . Cataract     left eye  . GERD (gastroesophageal reflux disease)   . Heart murmur   . Osteoporosis   . Stroke [facility])     central retinal occusion - right eye    Past Surgical History  Procedure Laterality Date  . Abdominal hysterectomy  1991  . Breast lumpectomy  2003  . Arthroscopy knee w/ drilling  2005/2008    Left knee/Right knee  . Tubal ligation  1983  . Mastectomy modified radical Right 04/15/2013    Procedure: MASTECTOMY MODIFIED RADICAL;  Surgeon: Adin [name], MD;  Location: [place];  Service: General;   Laterality: Right;  . Portacath placement Left 05/13/2013    Procedure: INSERTION PORT-A-CATH;  Surgeon: Adin [name], MD;  Location: [place];  Service: General;  Laterality: Left;    There were no vitals filed for this visit.  Visit Diagnosis:  Hip bursitis, left - Plan: PT plan of care cert/re-cert  Lt groin pain - Plan: PT plan of care cert/re-cert  Difficulty sleeping - Plan: PT plan of care cert/re-cert  Proximal leg weakness - Plan: PT plan of care cert/re-cert      Subjective Assessment - 11/26/15 1012    Subjective [name] states that she actually has two problems.  She has been having  Pain on the outside of her hip  for about 6 months; but she is also having left groin pain which started in the past several months.  She has started  doing leg exercises which has improved the pain on the outside of her hip but it has not gone away.  The exercises did not help her  groin pain. She states her pain is worst night.  She states that if she rolls over onto her hip it will wake her up; but her main concern at this time is her groin pain.  She denies any back pain or stiffness.    Pertinent History Pt was dx with Lt breast cancer in 2003;  Rt breast cancer in 2014; Pet scan in 2015 was normal.    How long can you sit comfortably? able to sit as long as she wants    How long can you stand comfortably? no problem   How long can you walk comfortably? no problem    Patient Stated Goals Pt  would like to be able to sleep throughout the night; be able to go without taking medication.    Currently in Pain? Yes  highest pain has been a 4/10    Pain Score 2    Pain Location Groin   Pain Orientation Left   Pain Descriptors / Indicators Aching   Pain Onset More than a month ago   Pain Frequency Intermittent   Aggravating Factors  sleeping    Pain Relieving Factors medication/ exercise.   Multiple Pain Sites Yes   Pain Score 0  highest pain in the past week 4/10   Pain Location Hip    Pain Orientation Left   Pain Descriptors / Indicators Aching   Pain Type Chronic pain   Pain Onset More than a month ago   Pain Frequency Constant   Effect of Pain on Daily Activities interupts sleeping             [facility] PT Assessment - 11/26/15 0001    Assessment   Medical Diagnosis Lt hip bursitis   Referring Provider Alusio   Onset Date/Surgical Date 05/19/15   Next MD Visit 01/09/2016   Prior Therapy none   Precautions   Precautions None   Restrictions   Weight Bearing Restrictions No   Balance Screen   Has the patient fallen in the past 6 months No   Has the patient had a decrease in activity level because of a fear of falling?  No   Is the patient reluctant to leave their home because of a fear of falling?  No   Home Ecologist residence   Prior Function   Level of Independence Independent   Vocation Retired   Leisure going to [employer]; walk, exercise    Cognition   Overall Cognitive Status Within Functional Limits for tasks assessed   Observation/Other Assessments   Focus on Therapeutic Outcomes (FOTO)  98   Functional Tests   Functional tests Single leg stance;Sit to Stand   Single Leg Stance   Comments Rt: 48"; LT 28"   Sit to Stand   Comments 9.24; normal for age 33.6    Posture/Postural Control   Posture/Postural Control Postural limitations   Postural Limitations Decreased lumbar lordosis;Decreased thoracic kyphosis;Posterior pelvic tilt   ROM / Strength   AROM / PROM / Strength AROM;Strength   AROM   AROM Assessment Site Hip;Knee   Right/Left Hip Left   Left Hip Extension 5   Left Hip Flexion 125   Left Hip External Rotation  30   Left Hip Internal Rotation  20   Left Hip ABduction 28   Right/Left Knee Left   Left Knee Extension 7  Pt states that this has been since birth.   Left Knee Flexion 130   Strength   Strength Assessment Site Hip;Knee;Ankle   Right/Left Hip Left   Left Hip Flexion 5/5   Left Hip  Extension 4-/5   Left Hip ABduction 5/5   Left Hip ADduction 4+/5   Right/Left Knee Left   Left Knee Flexion 4/5   Left Knee Extension 5/5   Right/Left Ankle Left   Left Ankle  Dorsiflexion 5/5   Flexibility   Soft Tissue Assessment /Muscle Length yes   Hamstrings Rt 150; Lt                    [facility] Adult PT Treatment/Exercise - 11/26/15 0001    Exercises   Exercises Lumbar;Knee/Hip   Lumbar Exercises: Stretches   Active Hamstring Stretch 2 reps;30 seconds   Standing Extension 5 reps   Standing Extension Limitations x2   Prone on Elbows Stretch 2 reps;30 seconds   Prone on Elbows Stretch Limitations with deep breathing relaxing low back on exhale    ITB Stretch 2 reps;30 seconds   Lumbar Exercises: Prone   Other Prone Lumbar Exercises glut sets x 10    Knee/Hip Exercises: Standing   SLS x3                PT Education - 11/26/15 1104    Education provided Yes   Education Details HEP   Person(s) Educated Patient   Methods Explanation;Demonstration;Handout;Verbal cues;Tactile cues   Comprehension Verbalized understanding;Returned demonstration          PT Short Term Goals - 11/26/15 1448    PT SHORT TERM GOAL #1   Title I in HEP in order to decrease pain to no greater than a 2 to improve pt quality of life    Time 2   Period Weeks   Status New   PT SHORT TERM GOAL #2   Title Pt to be able to sleep throughout the night when taking medication    Time 2   Period Weeks           PT Long Term Goals - 11/26/15 1449    PT LONG TERM GOAL #1   Title Pt to be I in advance HEP in order to reduce pain to 0/10 without medication for improved quality of life    Time 4   Period Weeks   PT LONG TERM GOAL #2   Title Pt to be able to sleep throughout the night without pain    Time 4   Period Weeks               Plan - 11/26/15 1115    Clinical Impression Statement Ms. Mesnard is a 75 yo female who has been having chronic pain in her Lt outer  hip as well as her groin.  Her pain seems to be exacerbated at night and is waking her up.  She is being referred to skilled PT to decrease her sx of pain and improve her quality of life.  Examination demonstrates postrual changes, tight mm  and weakened mm.  Ms. Ewton will benefit from skilled PT to address these issues decrease her sx of pain and improve her quality of life.    Pt will benefit from skilled therapeutic intervention in order to improve on the following deficits Decreased range of motion;Decreased strength;Postural dysfunction;Pain;Impaired flexibility   Rehab Potential Good   PT Frequency 1x / week ( due to financial reasons pt request to come in one time a week).    PT Duration 4 weeks   PT Treatment/Interventions ADLs/Self Care Home Management;Therapeutic activities;Therapeutic exercise;Balance training;Patient/family education   PT Next Visit Plan begin prone single leg raise, Press up and bridges ( pt started on SLS first treatment but please give HEP for all 4 as therapist forgot to put SLS on pt HEP.)   PT Home Exercise Plan given  G-Codes - 11/26/15 1453    Functional Assessment Tool Used mobility    Functional Limitation Mobility: Walking and moving around   Mobility: Walking and Moving Around Current Status [phone]) At least 1 percent but less than 20 percent impaired, limited or restricted   Mobility: Walking and Moving Around Goal Status [phone]) At least 1 percent but less than 20 percent impaired, limited or restricted       Problem List Patient Active Problem List   Diagnosis Date Noted  . Mild mitral valve prolapse 06/20/2014  . Palpitations 06/20/2014  . Essential hypertension 06/20/2014  . Breast cancer metastasized to axillary lymph node ([place]) 12/27/2012  . History of breast cancer 09/16/2012  . Primary cancer of upper outer quadrant of right female breast ([place]) 09/03/2012  . HYPERLIPIDEMIA 07/10/2007  . HEART MURMUR, HX OF 07/10/2007  .  ARTHROSCOPY, KNEE, HX OF 07/10/2007    Rayetta Humphrey, PT CLT [phone] 11/26/2015, 2:56 PM  [facility] [address] [place], [place], [address] Phone: [phone]   Fax:  [phone]  Name: Mary Young MRN: [id number] Date of Birth: 06/29/1941

## 2015-11-29 ENCOUNTER — Other Ambulatory Visit (HOSPITAL_COMMUNITY): Payer: Self-pay | Admitting: Family Medicine

## 2015-11-29 DIAGNOSIS — Z1231 Encounter for screening mammogram for malignant neoplasm of breast: Secondary | ICD-10-CM

## 2015-12-06 ENCOUNTER — Telehealth (HOSPITAL_COMMUNITY): Payer: Self-pay

## 2015-12-06 ENCOUNTER — Ambulatory Visit (HOSPITAL_COMMUNITY): Payer: Medicare Other

## 2015-12-06 DIAGNOSIS — G479 Sleep disorder, unspecified: Secondary | ICD-10-CM

## 2015-12-06 DIAGNOSIS — R1032 Left lower quadrant pain: Secondary | ICD-10-CM

## 2015-12-06 DIAGNOSIS — R29898 Other symptoms and signs involving the musculoskeletal system: Secondary | ICD-10-CM

## 2015-12-06 DIAGNOSIS — M7072 Other bursitis of hip, left hip: Secondary | ICD-10-CM

## 2015-12-06 NOTE — Telephone Encounter (Signed)
No show, called and left message.  Pt arrived 40 minutes late for apt today, thought apt was at different time.  No treatment held today.  [address], [place]; [facility] [phone]

## 2015-12-12 ENCOUNTER — Ambulatory Visit (HOSPITAL_COMMUNITY): Payer: Medicare Other | Admitting: Physical Therapy

## 2015-12-12 DIAGNOSIS — G479 Sleep disorder, unspecified: Secondary | ICD-10-CM

## 2015-12-12 DIAGNOSIS — M7072 Other bursitis of hip, left hip: Secondary | ICD-10-CM | POA: Diagnosis not present

## 2015-12-12 DIAGNOSIS — R1032 Left lower quadrant pain: Secondary | ICD-10-CM

## 2015-12-12 NOTE — Patient Instructions (Addendum)
.    Press-Up    Press upper body upward, keeping hips in contact with floor. Keep lower back and buttocks relaxed. Hold ___3_ seconds. Repeat _5-10___ times per set. Do ____1 sets per session. Do _1___ sessions per day.  http://orth.exer.us/94   Copyright  VHI. All rights reserved.  Straight Leg Raise (Prone)    Abdomen and head supported, keep left knee locked and raise leg at hip. Avoid arching low back. Repeat _10___ times per set. Do _1___ sets per session. Do _2___ sessions per day.  http://orth.exer.us/1112   Copyright  VHI. All rights reserved.  Bridging    Slowly raise buttocks from floor, keeping stomach tight. Repeat _10___ times per set. Do ___1_ sets per session. Do __1__ sessions per day.  http://orth.exer.us/1096   Copyright  VHI. All rights reserved.  Hamstring Stretch: Active    Support behind right knee. Starting with knee bent, attempt to straighten knee until a comfortable stretch is felt in back of thigh. Hold __30__ seconds. Repeat _2___ times per set. Do _1___ sets per session. Do _1___ sessions per day.  http://orth.exer.us/158   Copyright  VHI. All rights reserved.  Balance: Unilateral    Attempt to balance on left leg, eyes open. Hold _40-60___ seconds. Repeat ___3_ times per set. Do ___1_ sets per session. Do _2___ sessions per day. Perform exercise with eyes closed.  http://orth.exer.us/28   Copyright  VHI. All rights reserved.

## 2015-12-12 NOTE — Therapy (Signed)
[facility] [address] [place], [place], [address] Phone: [phone]   Fax:  [phone]  Physical Therapy Treatment  Patient Details  Name: Mary Young MRN: [id number] Date of Birth: 1941/08/11 Referring Provider: Maureen Ralphs  Encounter Date: 12/12/2015      PT End of Session - 12/12/15 0953    Visit Number 2   Number of Visits 2   Date for PT Re-Evaluation 12/26/15   Authorization Type [employer]-medicare   PT Start Time 0933   PT Stop Time 0958   PT Time Calculation (min) 25 min   Activity Tolerance Patient tolerated treatment well   Behavior During Therapy [facility] for tasks assessed/performed      Past Medical History  Diagnosis Date  . Hypertension   . Glaucoma   . Central retinal vein occlusion   . Joint pain   . Dyslipidemia   . H/O echocardiogram 09/27/2009    Note to have some aortic valve sclerosis, mild mitral valve prolapse, mild mitral regurgitation, and mild asymptomatic LVH with an EF>55%  . History of stress test 09/27/2009    Normal Myocardial perfusion study. No significant ischemia demonstrated, this low risk scan. compared to the previous study.,there is no significant change, Normal myocardial Perfusion imaging. EF>83%  . Seasonal allergies   . Breast cancer ([place])     left lumpectomy 2003  . Hyperlipidemia   . Allergy   . Arthritis   . Cataract     left eye  . GERD (gastroesophageal reflux disease)   . Heart murmur   . Osteoporosis   . Stroke [facility])     central retinal occusion - right eye    Past Surgical History  Procedure Laterality Date  . Abdominal hysterectomy  1991  . Breast lumpectomy  2003  . Arthroscopy knee w/ drilling  2005/2008    Left knee/Right knee  . Tubal ligation  1983  . Mastectomy modified radical Right 04/15/2013    Procedure: MASTECTOMY MODIFIED RADICAL;  Surgeon: Adin [name], MD;  Location: [facility];  Service: General;  Laterality: Right;  . Portacath placement Left  05/13/2013    Procedure: INSERTION PORT-A-CATH;  Surgeon: Adin [name], MD;  Location: [facility];  Service: General;  Laterality: Left;    There were no vitals filed for this visit.  Visit Diagnosis:  Hip bursitis, left  Lt groin pain  Difficulty sleeping      Subjective Assessment - 12/12/15 0938    Subjective Ms. Mary Young states that she has been doing her exercises.  She states that the exercises has helped her back and hip.  She is not having any pain at this time.  She states that she is walking 3 miles a day now and wants today to be her last treatmentl     Currently in Pain? No/denies            [facility] PT Assessment - 12/12/15 0001    Assessment   Medical Diagnosis Lt hip bursitis   Onset Date/Surgical Date 05/19/15   Next MD Visit 01/09/2016   Prior Therapy none   Precautions   Precautions None   Restrictions   Weight Bearing Restrictions No   [place] residence   Prior Function   Level of [place] Retired   Leisure going to [employer]; walk, exercise    Cognition   Overall Cognitive Status Within Functional Limits for tasks assessed   Observation/Other Assessments  Focus on Therapeutic Outcomes (FOTO)  98   Functional Tests   Functional tests Single leg stance;Sit to Stand   Single Leg Stance   Comments Rt: 48"; LT 28"   Sit to Stand   Comments 9.24; normal for age 85.6    Posture/Postural Control   Posture/Postural Control Postural limitations   Postural Limitations Decreased lumbar lordosis;Decreased thoracic kyphosis;Posterior pelvic tilt   AROM   Left Hip Extension 8   Left Hip Flexion 125   Left Hip External Rotation  30   Left Hip Internal Rotation  24   Left Hip ABduction 28   Left Knee Extension 7  Pt states that this has been since birth.   Left Knee Flexion 130   Strength   Left Hip Flexion 5/5   Left Hip Extension 4/5   Left Hip ABduction 5/5   Left Hip ADduction 4+/5    Left Knee Flexion 4+/5   Left Knee Extension 5/5   Left Ankle Dorsiflexion 5/5   Flexibility   Soft Tissue Assessment /Muscle Length yes   Hamstrings Rt 150; LT 150                     [facility] Adult PT Treatment/Exercise - 12/12/15 0001    Lumbar Exercises: Stretches   Active Hamstring Stretch 2 reps;30 seconds   Standing Extension 5 reps   Prone on Elbows Stretch 2 reps   Press Ups 5 reps   Lumbar Exercises: Standing   Other Standing Lumbar Exercises SLS; LT 30"  RT 10: x 5    Lumbar Exercises: Supine   Bridge 10 reps   Lumbar Exercises: Prone   Straight Leg Raise 10 reps                PT Education - 12/12/15 [phone]    Education provided Yes   Education Details new exercises    Person(s) Educated Patient   Methods Explanation   Comprehension Verbalized understanding          PT Short Term Goals - 12/12/15 0955    PT SHORT TERM GOAL #1   Title I in HEP in order to decrease pain to no greater than a 2 to improve pt quality of life    Time 2   Period Weeks   Status Achieved   PT SHORT TERM GOAL #2   Title Pt to be able to sleep throughout the night when taking medication    Time 2   Period Weeks   Status Achieved           PT Long Term Goals - 12/12/15 9150    PT LONG TERM GOAL #1   Title Pt to be I in advance HEP in order to reduce pain to 0/10 without medication for improved quality of life    Time 4   Period Weeks   Status Achieved   PT LONG TERM GOAL #2   Title Pt to be able to sleep throughout the night without pain    Time 4   Period Weeks   Status Achieved               Plan - 12/12/15 0953    Clinical Impression Statement Pt states that she is pain free now and wishes to be discharge.  Pt is walking three miles a day again and sleeping without difficulty.    PT Next Visit Plan Discharge.           G-Codes -  12/12/15 1007    Functional Assessment Tool Used foto/   Mobility: Walking and Moving Around Goal Status  [phone]) At least 1 percent but less than 20 percent impaired, limited or restricted   Mobility: Walking and Moving Around Discharge Status [phone]) At least 1 percent but less than 20 percent impaired, limited or restricted      Problem List Patient Active Problem List   Diagnosis Date Noted  . Mild mitral valve prolapse 06/20/2014  . Palpitations 06/20/2014  . Essential hypertension 06/20/2014  . Breast cancer metastasized to axillary lymph node ([place]) 12/27/2012  . History of breast cancer 09/16/2012  . Primary cancer of upper outer quadrant of right female breast ([place]) 09/03/2012  . HYPERLIPIDEMIA 07/10/2007  . HEART MURMUR, HX OF 07/10/2007  . ARTHROSCOPY, KNEE, HX OF 07/10/2007  Rayetta Humphrey, PT CLT [phone] 12/12/2015, 10:08 AM  [facility] [address] [place], [place], [address] Phone: [phone]   Fax:  [phone]  Name: Mary Young MRN: [id number] Date of Birth: 1941-01-11   PHYSICAL THERAPY DISCHARGE SUMMARY  Visits from Start of Care: 2  Current functional level related to goals / functional outcomes: See above   Remaining deficits: Continues to be stiff but no pain   Education / Equipment: HEP  Plan: Patient agrees to discharge.  Patient goals were not met. Patient is being discharged due to meeting the stated rehab goals.  ?????  Rayetta Humphrey, [place] CLT [phone]

## 2015-12-13 NOTE — Therapy (Signed)
[facility] [place], [place], [address] Phone: [phone]   Fax:  [phone]  Patient Details  Name: Mary Young MRN: [id number] Date of Birth: May 15, 1941 Referring Provider:  Gerrit Halls, PA-C  Encounter Date: 12/06/2015   No show, called and left message. Pt arrived 40 minutes late for apt today, thought apt was at different time. No treatment held today.  [address] LPTA; [place]  Aldona Lento 12/13/2015, 6:08 PM  [facility] [address] [place], [place], [address] Phone: [phone]   Fax:  [phone]

## 2015-12-17 ENCOUNTER — Encounter (HOSPITAL_COMMUNITY): Payer: Medicare Other | Admitting: Physical Therapy

## 2015-12-19 ENCOUNTER — Encounter: Payer: Self-pay | Admitting: Hematology and Oncology

## 2015-12-24 ENCOUNTER — Encounter (HOSPITAL_COMMUNITY): Payer: Medicare Other | Admitting: Physical Therapy

## 2015-12-31 ENCOUNTER — Encounter (HOSPITAL_COMMUNITY): Payer: Medicare Other | Admitting: Physical Therapy

## 2016-01-07 ENCOUNTER — Encounter (HOSPITAL_COMMUNITY): Payer: Medicare Other | Admitting: Physical Therapy

## 2016-01-07 ENCOUNTER — Ambulatory Visit (HOSPITAL_COMMUNITY)
Admission: RE | Admit: 2016-01-07 | Discharge: 2016-01-07 | Disposition: A | Discharge status: Home / Self Care | Payer: Medicare Other | Source: Ambulatory Visit | Attending: Family Medicine | Admitting: Family Medicine

## 2016-01-07 DIAGNOSIS — Z1231 Encounter for screening mammogram for malignant neoplasm of breast: Secondary | ICD-10-CM | POA: Insufficient documentation

## 2016-01-14 ENCOUNTER — Encounter (HOSPITAL_COMMUNITY): Payer: Medicare Other | Admitting: Physical Therapy

## 2016-05-23 ENCOUNTER — Other Ambulatory Visit: Payer: Self-pay

## 2016-05-23 DIAGNOSIS — C773 Secondary and unspecified malignant neoplasm of axilla and upper limb lymph nodes: Secondary | ICD-10-CM

## 2016-05-23 DIAGNOSIS — C50911 Malignant neoplasm of unspecified site of right female breast: Secondary | ICD-10-CM

## 2016-05-23 DIAGNOSIS — C50411 Malignant neoplasm of upper-outer quadrant of right female breast: Secondary | ICD-10-CM

## 2016-05-23 NOTE — Progress Notes (Signed)
Received call from [facility] in scheduling regarding VM pt left requesting to speak to someone regarding new pain in right arm.  Called pt back to discuss.  Pt reports pain in her right arm/shoulder for the last month or so.  Pt states she has trouble lifting and with full range of motion in this arm sometimes.  Pt reports "tight" sensation.  Pt denies any redness/localized swelling/signs of infection and is concerned with potential for lymphedema as this is the side she had mastectomy in 2014.  Discussed with Dr. Lindi Adie who ordered referral to lymphedema clinic.  Referral entered and pt notified that she would be contact by lymphedema clinic's office for further arrangements.  Pt encouraged to call us back with any additional concerns or questions, should they arise.

## 2016-05-29 ENCOUNTER — Ambulatory Visit: Payer: Medicare Other | Attending: Hematology and Oncology | Admitting: Physical Therapy

## 2016-05-29 ENCOUNTER — Encounter: Payer: Self-pay | Admitting: Physical Therapy

## 2016-05-29 DIAGNOSIS — M25611 Stiffness of right shoulder, not elsewhere classified: Secondary | ICD-10-CM | POA: Diagnosis present

## 2016-05-29 DIAGNOSIS — R600 Localized edema: Secondary | ICD-10-CM | POA: Diagnosis present

## 2016-05-29 DIAGNOSIS — M25511 Pain in right shoulder: Secondary | ICD-10-CM | POA: Diagnosis present

## 2016-05-29 NOTE — Therapy (Signed)
[place], [place], [address] Phone: [phone]   Fax:  [phone]  Physical Therapy Evaluation  Patient Details  Name: Mary Young MRN: [id number] Date of Birth: October 15, 1941 Referring Provider: Dr. Lindi Adie  Encounter Date: 05/29/2016      PT End of Session - 05/29/16 1625    Visit Number 1   Number of Visits 9   Date for PT Re-Evaluation 07/24/16   PT Start Time [id number]   PT Stop Time 1437   PT Time Calculation (min) 44 min   Activity Tolerance Patient tolerated treatment well   Behavior During Therapy [facility] for tasks assessed/performed      Past Medical History  Diagnosis Date  . Hypertension   . Glaucoma   . Central retinal vein occlusion   . Joint pain   . Dyslipidemia   . H/O echocardiogram 09/27/2009    Note to have some aortic valve sclerosis, mild mitral valve prolapse, mild mitral regurgitation, and mild asymptomatic LVH with an EF>55%  . History of stress test 09/27/2009    Normal Myocardial perfusion study. No significant ischemia demonstrated, this low risk scan. compared to the previous study.,there is no significant change, Normal myocardial Perfusion imaging. EF>83%  . Seasonal allergies   . Breast cancer ([place])     left lumpectomy 2003  . Hyperlipidemia   . Allergy   . Arthritis   . Cataract     left eye  . GERD (gastroesophageal reflux disease)   . Heart murmur   . Osteoporosis   . Stroke [facility])     central retinal occusion - right eye    Past Surgical History  Procedure Laterality Date  . Abdominal hysterectomy  1991  . Breast lumpectomy  2003  . Arthroscopy knee w/ drilling  2005/2008    Left knee/Right knee  . Tubal ligation  1983  . Mastectomy modified radical Right 04/15/2013    Procedure: MASTECTOMY MODIFIED RADICAL;  Surgeon: Adin [name], MD;  Location: [facility];  Service: General;  Laterality: Right;  . Portacath placement Left 05/13/2013    Procedure:  INSERTION PORT-A-CATH;  Surgeon: Adin [name], MD;  Location: [facility];  Service: General;  Laterality: Left;    There were no vitals filed for this visit.       Subjective Assessment - 05/29/16 1403    Subjective Pt diagnosed with right breast cancer in 2013. Pt underwent a right mastectomy in 03/2013. Pt received chemo and radiation and completed around 10/31/2013. Pt had numbness in her right arm and lateral trunk. Recently she has noticed the numbness is going away. Pt states she now has right UE pain in her upper arm and it began in the beginning of this year. Pt reports two months ago she went out ot lunch and put her purse over her shoulder and when she went out to the car and put it in reverse she had horrific pain in right arm and had to drive home with one hand.    Pertinent History L breast cancer and lumpectomy in 2003, in 2014 pt underwent a right mastectomy for R breast cancer and received chemo and radiation, she is currently taking hormone pills   Patient Stated Goals to be able to get back to using my RUE without having pain in it   Currently in Pain? Yes   Pain Score 3    Pain Location Shoulder   Pain Orientation Right   Pain Descriptors / Indicators  Sore   Pain Type Chronic pain   Pain Onset More than a month ago   Pain Frequency Intermittent   Aggravating Factors  turning over in bed, lifting arm   Pain Relieving Factors after doing arm exercises            [facility] PT Assessment - 05/29/16 0001    Assessment   Medical Diagnosis right breast cancer  also hx of left breast cancer   Referring Provider Dr. Lindi Adie   Onset Date/Surgical Date 04/15/13   Hand Dominance Left   Prior Therapy Jan 2017 therapy for hip    Precautions   Precautions Other (comment)  lymphedema   Restrictions   Weight Bearing Restrictions No   Balance Screen   Has the patient fallen in the past 6 months No   Has the patient had a decrease in activity level because of a fear of falling?   No   Is the patient reluctant to leave their home because of a fear of falling?  No   Home Environment   Living Environment Private residence   Living Arrangements Spouse/significant other   Available Help at Discharge Family   Type of [place] to enter   Entrance Stairs-Number of Steps [place] One level   [place] None   Prior Function   Level of [place] Retired   Leisure pt does her PT exercises given to her in Jan 4x/wk, pt walks on treadmill 3x/wk   Cognition   Overall Cognitive Status Within Functional Limits for tasks assessed   AROM   Right Shoulder Flexion 159 Degrees   Right Shoulder ABduction 162 Degrees   Right Shoulder Internal Rotation --  pt unable to secondary to pain   Right Shoulder External Rotation 90 Degrees   Left Shoulder Flexion 175 Degrees   Left Shoulder ABduction 180 Degrees   Left Shoulder Internal Rotation 69 Degrees   Left Shoulder External Rotation 90 Degrees           LYMPHEDEMA/ONCOLOGY QUESTIONNAIRE - 05/29/16 1420    Type   Cancer Type right breast cancer  left breast cancer   Surgeries   Mastectomy Date 04/15/13  on right   Lumpectomy Date 03/17/02   Axillary Lymph Node Dissection Date 04/15/16   Number Lymph Nodes Removed --  pt reports all were removed   Date Lymphedema/Swelling Started   Date 04/15/16   Treatment   Active Chemotherapy Treatment No   Past Chemotherapy Treatment Yes   Date 05/31/13   Active Radiation Treatment No   Past Radiation Treatment Yes   Date 10/31/13   Body Site breast and axilla   Current Hormone Treatment Yes   Drug Name Anastrozole   Past Hormone Therapy No   What other symptoms do you have   Are you Having Heaviness or Tightness No   Are you having Pain No   Are you having pitting edema No   Is it Hard or Difficult finding clothes that fit No   Do you have infections No   Is there Decreased scar  mobility Yes           Katina Dung - 05/29/16 0001    Open a tight or new jar Moderate difficulty   Do heavy household chores (wash walls, wash floors) Mild difficulty   Carry a shopping bag or briefcase No difficulty   Wash your back Unable  Use a knife to cut food No difficulty   Recreational activities in which you take some force or impact through your arm, shoulder, or hand (golf, hammering, tennis) Moderate difficulty   During the past week, to what extent has your arm, shoulder or hand problem interfered with your normal social activities with family, friends, neighbors, or groups? Not at all   During the past week, to what extent has your arm, shoulder or hand problem limited your work or other regular daily activities Not at all   Arm, shoulder, or hand pain. Mild   Tingling (pins and needles) in your arm, shoulder, or hand None   Difficulty Sleeping Moderate difficulty   DASH Score 27.27 %                        Short Term Clinic Goals - 05/29/16 1631    CC Short Term Goal  #1   Title Pt to demonstrate 40 degrees of right shoulder IR to allow pt to reach behind back   Baseline unable secondary to pain   Time 4   Period Weeks   Status New   CC Short Term Goal  #2   Title Pt to able to independently verbalize lymphedema risk reduction practices   Time 4   Period Weeks   Status New   CC Short Term Goal  #3   Title Pt to receive appropriate compression garment for flying   Time 4   Period Weeks   Status New             Long Term Clinic Goals - 05/29/16 1633    CC Long Term Goal  #1   Title Pt to demonstrate 170 degrees of right shoulder flexion to allow her to reach items overhead   Baseline 159   Time 8   Period Weeks   Status New   CC Long Term Goal  #2   Title Pt to demonstrate 175 degrees of right shoulder abduction to allow her to reach items out to sides    Baseline 162   Time 8   Period Weeks   Status New   CC Long Term Goal   #3   Title Pt to report a 75% decrease in pain in right shoulder/UE with UE movements for increased comfort   Time 8   Period Weeks   Status New   CC Long Term Goal  #4   Title Pt to demonstrate 60 degrees of right shoulder IR to allow her to reach behind and wash her back/fasten bra   Baseline unable due to pain   Time 8   Period Weeks   Status New   CC Long Term Goal  #5   Title Pt will be independent with a HEP for continued strengthening and ROM   Time 8   Period Weeks   Status New            Plan - 05/29/16 1626    Clinical Impression Statement Pt presents today with history of right breast cancer. She underwent a R mastectomy in 2014 and completed chemotherapy and radiation. She began having pain in her right upper arm recently. She has pain when she extends her elbow. She is unable to internally rotate her RUE secondary to pain. Pt would benefit from skilled PT services to increase R shoulder ROM and strength and to decrease pain. She also reports some edema in right lateral trunk that has been present  with surgery and has not really changed.    Rehab Potential Good   Clinical Impairments Affecting Rehab Potential hx of radiation, can only come 1x/wk   PT Frequency 1x / week   PT Duration 8 weeks   PT Treatment/Interventions Manual techniques;Manual lymph drainage;Therapeutic exercise;Taping;Electrical Stimulation;Patient/family education;Passive range of motion;Scar mobilization;ADLs/Self Care Home Management   PT Next Visit Plan measure circumference of bilateral UE, begin PROM/AAROM/AROM to R shoulder in IR, begin scar massage, assess indep with median nerve stretch   PT Home Exercise Plan median nerve stretch   Consulted and Agree with Plan of Care Patient      Patient will benefit from skilled therapeutic intervention in order to improve the following deficits and impairments:  Decreased range of motion, Pain, Impaired UE functional use, Increased fascial restricitons,  Decreased strength, Increased edema, Decreased scar mobility  Visit Diagnosis: Pain in right shoulder - Plan: PT plan of care cert/re-cert  Stiffness of right shoulder, not elsewhere classified - Plan: PT plan of care cert/re-cert  Localized edema - Plan: PT plan of care cert/re-cert      G-Codes - [id number]    Functional Assessment Tool Used Quick DASH   Functional Limitation Carrying, moving and handling objects   Carrying, Moving and Handling Objects Current Status [id number]) At least 20 percent but less than 40 percent impaired, limited or restricted   Carrying, Moving and Handling Objects Goal Status [id number]) At least 1 percent but less than 20 percent impaired, limited or restricted       Problem List Patient Active Problem List   Diagnosis Date Noted  . Mild mitral valve prolapse 06/20/2014  . Palpitations 06/20/2014  . Essential hypertension 06/20/2014  . Breast cancer metastasized to axillary lymph node ([place]) 12/27/2012  . History of breast cancer 09/16/2012  . Primary cancer of upper outer quadrant of right female breast ([place]) 09/03/2012  . HYPERLIPIDEMIA 07/10/2007  . HEART MURMUR, HX OF 07/10/2007  . ARTHROSCOPY, KNEE, HX OF 07/10/2007    Alexia Freestone 05/29/2016, 4:39 PM  [facility] [place], [place], [address] Phone: [phone]   Fax:  [phone]  Name: Mary Young MRN: [id number] Date of Birth: 1941-08-28   Allyson Sabal, PT 05/29/2016 4:39 PM

## 2016-05-29 NOTE — Patient Instructions (Signed)
MEDIAN NERVE: Flossing I    With right elbow bent and palm facing up as if holding a tray, head tilted away. Simultaneously straighten arm and tilt head toward involved shoulder. Do _1__ sets of _15__ repetitions per session. Do __2_ sessions per day.  Copyright  VHI. All rights reserved.

## 2016-06-11 ENCOUNTER — Ambulatory Visit: Payer: Medicare Other

## 2016-06-11 DIAGNOSIS — M25611 Stiffness of right shoulder, not elsewhere classified: Secondary | ICD-10-CM

## 2016-06-11 DIAGNOSIS — M25511 Pain in right shoulder: Secondary | ICD-10-CM | POA: Diagnosis not present

## 2016-06-11 DIAGNOSIS — R600 Localized edema: Secondary | ICD-10-CM

## 2016-06-11 NOTE — Therapy (Signed)
[place], [place], [address] Phone: [phone]   Fax:  [phone]  Physical Therapy Treatment  Patient Details  Name: Mary Young MRN: [id number] Date of Birth: [birth year] Referring Provider: Dr. Lindi Adie  Encounter Date: 06/11/2016      PT End of Session - 06/11/16 0912    Visit Number 2   Number of Visits 9   Date for PT Re-Evaluation 07/24/16   PT Start Time 0852   PT Stop Time 0933   PT Time Calculation (min) 41 min   Activity Tolerance Patient tolerated treatment well   Behavior During Therapy [facility] for tasks assessed/performed      Past Medical History:  Diagnosis Date  . Allergy   . Arthritis   . Breast cancer ([place])    left lumpectomy 2003  . Cataract    left eye  . Central retinal vein occlusion   . Dyslipidemia   . GERD (gastroesophageal reflux disease)   . Glaucoma   . H/O echocardiogram 09/27/2009   Note to have some aortic valve sclerosis, mild mitral valve prolapse, mild mitral regurgitation, and mild asymptomatic LVH with an EF>55%  . Heart murmur   . History of stress test 09/27/2009   Normal Myocardial perfusion study. No significant ischemia demonstrated, this low risk scan. compared to the previous study.,there is no significant change, Normal myocardial Perfusion imaging. EF>83%  . Hyperlipidemia   . Hypertension   . Joint pain   . Osteoporosis   . Seasonal allergies   . Stroke [facility])    central retinal occusion - right eye    Past Surgical History:  Procedure Laterality Date  . ABDOMINAL HYSTERECTOMY  1991  . ARTHROSCOPY KNEE W/ DRILLING  2005/2008   Left knee/Right knee  . BREAST LUMPECTOMY  2003  . MASTECTOMY MODIFIED RADICAL Right 04/15/2013   Procedure: MASTECTOMY MODIFIED RADICAL;  Surgeon: Adin [name], MD;  Location: [place];  Service: General;  Laterality: Right;  . PORTACATH PLACEMENT Left 05/13/2013   Procedure: INSERTION PORT-A-CATH;  Surgeon:  Adin [name], MD;  Location: [place];  Service: General;  Laterality: Left;  . TUBAL LIGATION  1983    There were no vitals filed for this visit.      Subjective Assessment - 06/11/16 0900    Subjective I think I want to go see my PCP to make sure I don't have a RCT or something because I don't think it's really lymphedema causing my pain. My Rt arm just feels numb right now.    Pertinent History L breast cancer and lumpectomy in 2003, in 2014 pt underwent a right mastectomy for R breast cancer and received chemo and radiation, she is currently taking hormone pills   Patient Stated Goals to be able to get back to using my RUE without having pain in it   Currently in Pain? Yes   Pain Score 3    Pain Location Shoulder   Pain Orientation Right   Pain Descriptors / Indicators Dull   Pain Onset More than a month ago   Pain Frequency Intermittent   Aggravating Factors  lifting arm in certain positions   Pain Relieving Factors stretching               LYMPHEDEMA/ONCOLOGY QUESTIONNAIRE - 06/11/16 0904      Right Upper Extremity Lymphedema   15 cm Proximal to Olecranon Process 26.4 cm   Olecranon Process 23 cm   15 cm Proximal to  Ulnar Styloid Process 21.3 cm   Just Proximal to Ulnar Styloid Process 14.5 cm   Across Hand at [employer] 16.9 cm   At [place] of 2nd Digit 5.5 cm     Left Upper Extremity Lymphedema   15 cm Proximal to Olecranon Process 27.2 cm   Olecranon Process 24.2 cm   15 cm Proximal to Ulnar Styloid Process 22.7 cm   Just Proximal to Ulnar Styloid Process 14.5 cm   Across Hand at [employer] 17.4 cm   At [place] of 2nd Digit 5.6 cm                  [facility] Adult PT Treatment/Exercise - 06/11/16 0001      Manual Therapy   Passive ROM In Supine to Rt shoulder into flexion, abduction and er to pts tolerance.                   Short Term Clinic Goals - 05/29/16 1631      CC Short Term Goal  #1   Title Pt to demonstrate 40 degrees  of right shoulder IR to allow pt to reach behind back   Baseline unable secondary to pain   Time 4   Period Weeks   Status New     CC Short Term Goal  #2   Title Pt to able to independently verbalize lymphedema risk reduction practices   Time 4   Period Weeks   Status New     CC Short Term Goal  #3   Title Pt to receive appropriate compression garment for flying   Time 4   Period Weeks   Status New             Long Term Clinic Goals - 05/29/16 1633      CC Long Term Goal  #1   Title Pt to demonstrate 170 degrees of right shoulder flexion to allow her to reach items overhead   Baseline 159   Time 8   Period Weeks   Status New     CC Long Term Goal  #2   Title Pt to demonstrate 175 degrees of right shoulder abduction to allow her to reach items out to sides    Baseline 162   Time 8   Period Weeks   Status New     CC Long Term Goal  #3   Title Pt to report a 75% decrease in pain in right shoulder/UE with UE movements for increased comfort   Time 8   Period Weeks   Status New     CC Long Term Goal  #4   Title Pt to demonstrate 60 degrees of right shoulder IR to allow her to reach behind and wash her back/fasten bra   Baseline unable due to pain   Time 8   Period Weeks   Status New     CC Long Term Goal  #5   Title Pt will be independent with a HEP for continued strengthening and ROM   Time 8   Period Weeks   Status New            Plan - 06/11/16 0935    Clinical Impression Statement Pts circumference measurements were taken today and no notable difference between. Pt worried she has a RCT or some type of injury due to her pain so she wants to see her PCP regarding this. Did suggest pt continue her ROM exercises within a pain tolerance.  She did well with P/ROM though did require constant VC to relax throughout.   Rehab Potential Good   Clinical Impairments Affecting Rehab Potential hx of radiation, can only come 1x/wk   PT Frequency 1x / week   PT  Duration 8 weeks   PT Treatment/Interventions Manual techniques;Manual lymph drainage;Therapeutic exercise;Taping;Electrical Stimulation;Patient/family education;Passive range of motion;Scar mobilization;ADLs/Self Care Home Management   PT Next Visit Plan See if pt went to PCP. Cont PROM/AAROM/AROM to R shoulder in IR, begin scar massage, assess indep with median nerve stretch   PT Home Exercise Plan median nerve stretch; A/ROM in fornt of mirror to work on decreasing scapular compensation with Rt UE.   Consulted and Agree with Plan of Care Patient      Patient will benefit from skilled therapeutic intervention in order to improve the following deficits and impairments:  Decreased range of motion, Pain, Impaired UE functional use, Increased fascial restricitons, Decreased strength, Increased edema, Decreased scar mobility  Visit Diagnosis: Pain in right shoulder  Stiffness of right shoulder, not elsewhere classified  Localized edema     Problem List Patient Active Problem List   Diagnosis Date Noted  . Mild mitral valve prolapse 06/20/2014  . Palpitations 06/20/2014  . Essential hypertension 06/20/2014  . Breast cancer metastasized to axillary lymph node ([place]) 12/27/2012  . History of breast cancer 09/16/2012  . Primary cancer of upper outer quadrant of right female breast ([place]) 09/03/2012  . HYPERLIPIDEMIA 07/10/2007  . HEART MURMUR, HX OF 07/10/2007  . ARTHROSCOPY, KNEE, HX OF 07/10/2007    Otelia Limes, PTA 06/11/2016, 9:40 AM  [place], [place], [address] Phone: [phone]   Fax:  [phone]  Name: Mary Young MRN: [id number] Date of Birth: 1941-05-08

## 2016-06-12 ENCOUNTER — Telehealth: Payer: Self-pay | Admitting: Hematology and Oncology

## 2016-06-12 NOTE — Telephone Encounter (Signed)
r/s appt fro Mary Young-per message from pt

## 2016-06-12 NOTE — Telephone Encounter (Signed)
per Verdis Frederickson called and left message for pt of r/s appt

## 2016-06-18 ENCOUNTER — Encounter: Payer: Self-pay | Admitting: Physical Therapy

## 2016-06-18 ENCOUNTER — Ambulatory Visit: Payer: Medicare Other | Attending: Hematology and Oncology | Admitting: Physical Therapy

## 2016-06-18 DIAGNOSIS — M25511 Pain in right shoulder: Secondary | ICD-10-CM

## 2016-06-18 DIAGNOSIS — M25611 Stiffness of right shoulder, not elsewhere classified: Secondary | ICD-10-CM | POA: Diagnosis present

## 2016-06-18 NOTE — Therapy (Signed)
[place], [place], [address] Phone: [phone]   Fax:  [phone]  Physical Therapy Treatment  Patient Details  Name: Mary Young MRN: [id number] Date of Birth: 04-27-1941 Referring Provider: Dr. Lindi Adie  Encounter Date: 06/18/2016      PT End of Session - 06/18/16 0942    Visit Number 3   Number of Visits 9   Date for PT Re-Evaluation 07/24/16   PT Start Time 0852   PT Stop Time 0936   PT Time Calculation (min) 44 min   Activity Tolerance Patient tolerated treatment well   Behavior During Therapy Mary Young for tasks assessed/performed      Past Medical History:  Diagnosis Date  . Allergy   . Arthritis   . Breast cancer ([place])    left lumpectomy 2003  . Cataract    left eye  . Central retinal vein occlusion   . Dyslipidemia   . GERD (gastroesophageal reflux disease)   . Glaucoma   . H/O echocardiogram 09/27/2009   Note to have some aortic valve sclerosis, mild mitral valve prolapse, mild mitral regurgitation, and mild asymptomatic LVH with an EF>55%  . Heart murmur   . History of stress test 09/27/2009   Normal Myocardial perfusion study. No significant ischemia demonstrated, this low risk scan. compared to the previous study.,there is no significant change, Normal myocardial Perfusion imaging. EF>83%  . Hyperlipidemia   . Hypertension   . Joint pain   . Osteoporosis   . Seasonal allergies   . Stroke [place] [facility])    central retinal occusion - right eye    Past Surgical History:  Procedure Laterality Date  . ABDOMINAL HYSTERECTOMY  1991  . ARTHROSCOPY KNEE W/ DRILLING  2005/2008   Left knee/Right knee  . BREAST LUMPECTOMY  2003  . MASTECTOMY MODIFIED RADICAL Right 04/15/2013   Procedure: MASTECTOMY MODIFIED RADICAL;  Surgeon: Adin [name], MD;  Location: [place];  Service: General;  Laterality: Right;  . PORTACATH PLACEMENT Left 05/13/2013   Procedure: INSERTION PORT-A-CATH;  Surgeon:  Adin [name], MD;  Location: [place];  Service: General;  Laterality: Left;  . TUBAL LIGATION  1983    There were no vitals filed for this visit.      Subjective Assessment - 06/18/16 0854    Subjective I used to use this stuff called blue ice on my knee when I lived up [place]. Here it is called blue goo and I have been using it on my arm at night. It has been working better than the exercises.  I want to cancel my next two appointments because I am doing really good. I am doing my exercises more.    Pertinent History L breast cancer and lumpectomy in 2003, in 2014 pt underwent a right mastectomy for R breast cancer and received chemo and radiation, she is currently taking hormone pills   Patient Stated Goals to be able to get back to using my RUE without having pain in it   Currently in Pain? No/denies   Pain Score 0-No pain                         [facility] Adult PT Treatment/Exercise - 06/18/16 0001      Shoulder Exercises: Supine   Horizontal ABduction Strengthening;Both;10 reps;Theraband   Theraband Level (Shoulder Horizontal ABduction) Level 2 (Red)   External Rotation Strengthening;Both;10 reps;Theraband   Theraband Level (Shoulder External Rotation) Level 2 (Red)  Flexion Strengthening;Both;10 reps;Theraband   Theraband Level (Shoulder Flexion) Level 2 (Red)   Other Supine Exercises D2 with red theraband x 10 reps bilaterally     Shoulder Exercises: Standing   External Rotation Strengthening;10 reps;Theraband;Both   Theraband Level (Shoulder External Rotation) Level 2 (Red)   Internal Rotation Strengthening;10 reps;Theraband;Both   Theraband Level (Shoulder Internal Rotation) Level 2 (Red)   Flexion Strengthening;10 reps;Theraband;Both   Theraband Level (Shoulder Flexion) Level 2 (Red)   Extension Strengthening;10 reps;Theraband;Both   Theraband Level (Shoulder Extension) Level 2 (Red)                PT Education - 06/18/16 0946    Education  provided Yes   Education Details lymphedema risk reduction practices   Person(s) Educated Patient   Methods Explanation;Handout   Comprehension Verbalized understanding           Short Term Clinic Goals - 06/18/16 0859      CC Short Term Goal  #1   Title Pt to demonstrate 40 degrees of right shoulder IR to allow pt to reach behind back   Baseline unable secondary to pain, 06/18/16- 69 degrees   Status Achieved     CC Short Term Goal  #2   Title Pt to able to independently verbalize lymphedema risk reduction practices   Baseline 06/18/16- pt educated today   Time 4   Period Weeks   Status On-going     CC Short Term Goal  #3   Title Pt to receive appropriate compression garment for flying   Baseline 06/18/16- pt sent with information to obtain today   Time 4   Period Weeks   Status On-going             Long Term Clinic Goals - 06/18/16 0907      CC Long Term Goal  #1   Title Pt to demonstrate 170 degrees of right shoulder flexion to allow her to reach items overhead   Baseline 159, 06/18/16- 170 degrees   Time 8   Period Weeks   Status On-going     CC Long Term Goal  #2   Title Pt to demonstrate 175 degrees of right shoulder abduction to allow her to reach items out to sides    Baseline 162, 06/18/16- 180 degrees   Time 8   Period Weeks   Status Achieved     CC Long Term Goal  #3   Title Pt to report a 75% decrease in pain in right shoulder/UE with UE movements for increased comfort   Baseline 06/18/16- 85% improvement   Time 8   Period Weeks   Status Achieved     CC Long Term Goal  #4   Title Pt to demonstrate 60 degrees of right shoulder IR to allow her to reach behind and wash her back/fasten bra   Baseline unable due to pain, 06/18/16- 69 degrees     CC Long Term Goal  #5   Title Pt will be independent with a HEP for continued strengthening and ROM   Time 8   Period Weeks   Status On-going            Plan - 06/18/16 [id number]    Clinical Impression  Statement Pt has made excellent progress towards goals in therapy. She reports she has been applying "blue goo" to her shoulder and it has helped tremendously with her pain levels. She has regained full ROM of her right upper extremity. She was sent with  information to obtain a compression sleeve today and educated about lymphedema risk reduction practices. Instructed pt in shoulder strengthening exercises today as well as scapular stabilization exercises. Pt able to return demonstrate without decrease in pain. Pt to cancel next two appointments and follow up at the end of the month due to improvements.    Clinical Impairments Affecting Rehab Potential hx of radiation, can only come 1x/wk   PT Frequency 1x / week   PT Duration 8 weeks   PT Treatment/Interventions Manual techniques;Manual lymph drainage;Therapeutic exercise;Taping;Electrical Stimulation;Patient/family education;Passive range of motion;Scar mobilization;ADLs/Self Care Home Management   PT Next Visit Plan assess for indep with new exercises, assess pain   PT Home Exercise Plan median nerve stretch; A/ROM in fornt of mirror to work on decreasing scapular compensation with Rt UE., scapular stabilization, [place] exercises   Consulted and Agree with Plan of Care Patient      Patient will benefit from skilled therapeutic intervention in order to improve the following deficits and impairments:  Decreased range of motion, Pain, Impaired UE functional use, Increased fascial restricitons, Decreased strength, Increased edema, Decreased scar mobility  Visit Diagnosis: Pain in right shoulder  Stiffness of right shoulder, not elsewhere classified     Problem List Patient Active Problem List   Diagnosis Date Noted  . Mild mitral valve prolapse 06/20/2014  . Palpitations 06/20/2014  . Essential hypertension 06/20/2014  . Breast cancer metastasized to axillary lymph node ([place]) 12/27/2012  . History of breast cancer 09/16/2012  . Primary  cancer of upper outer quadrant of right female breast ([place]) 09/03/2012  . HYPERLIPIDEMIA 07/10/2007  . HEART MURMUR, HX OF 07/10/2007  . ARTHROSCOPY, KNEE, HX OF 07/10/2007    Mary Young 06/18/2016, 9:47 AM  [place], [place], [address] Phone: [phone]   Fax:  [phone]  Name: Mary Young MRN: [id number] Date of Birth: 1941-09-11   Allyson Sabal, PT 06/18/16 9:47 AM

## 2016-06-18 NOTE — Patient Instructions (Addendum)
Over Head Pull: Narrow Grip     [place] [phone]   On back, knees bent, feet flat, band across thighs, elbows straight but relaxed. Pull hands apart (start). Keeping elbows straight, bring arms up and over head, hands toward floor. Keep pull steady on band. Hold momentarily. Return slowly, keeping pull steady, back to start. Repeat _10__ times. Band color _red_____   Side Pull: Double Arm   On back, knees bent, feet flat. Arms perpendicular to body, shoulder level, elbows straight but relaxed. Pull arms out to sides, elbows straight. Resistance band comes across collarbones, hands toward floor. Hold momentarily. Slowly return to starting position. Repeat _10__ times. Band color __red___   Elmer Picker   On back, knees bent, feet flat, left hand on left hip, right hand above left. Pull right arm DIAGONALLY (hip to shoulder) across chest. Bring right arm along head toward floor. Hold momentarily. Slowly return to starting position. Repeat on opposite side.  Repeat _10__ times. Do with left arm. Band color __red____   Shoulder Rotation: Double Arm   On back, knees bent, feet flat, elbows tucked at sides, bent 90, hands palms up. Pull hands apart and down toward floor, keeping elbows near sides. Hold momentarily. Slowly return to starting position. Repeat 10___ times. Band color _red_____   Strengthening: Resisted Internal Rotation   Hold tubing in left hand, elbow at side and forearm out. Rotate forearm in across body. Repeat on opposite side. Repeat ___10_ times per set. Do ___1_ sets per session. Do __1-2__ sessions per day.  http://orth.exer.us/830   Copyright  VHI. All rights reserved.  Strengthening: Resisted External Rotation   Hold tubing in right hand, elbow at side and forearm across body. Rotate forearm out. Repeat on opposite side. Repeat __10__ times per set. Do __1__ sets per session. Do ___1-2_ sessions per day.  http://orth.exer.us/828   Copyright  VHI. All rights  reserved.  Strengthening: Resisted Flexion   Hold tubing with left arm at side. Pull forward and up. Move shoulder through pain-free range of motion. Repeat on opposite side. Repeat __10__ times per set. Do __1__ sets per session. Do _1-2___ sessions per day.  http://orth.exer.us/824   Copyright  VHI. All rights reserved.  Strengthening: Resisted Extension   Hold tubing in right hand, arm forward. Pull arm back, elbow straight. Repeat on opposite side.  Repeat _10___ times per set. Do __1__ sets per session. Do _1-2___ sessions per day.  http://orth.exer.us/832   Copyright  VHI. All rights reserved.

## 2016-06-27 ENCOUNTER — Encounter: Payer: Medicare Other | Admitting: Physical Therapy

## 2016-07-04 ENCOUNTER — Encounter: Payer: Medicare Other | Admitting: Physical Therapy

## 2016-07-11 ENCOUNTER — Encounter: Payer: Self-pay | Admitting: Physical Therapy

## 2016-07-11 ENCOUNTER — Ambulatory Visit: Payer: Medicare Other | Admitting: Physical Therapy

## 2016-07-11 DIAGNOSIS — M25511 Pain in right shoulder: Secondary | ICD-10-CM | POA: Diagnosis not present

## 2016-07-11 DIAGNOSIS — M25611 Stiffness of right shoulder, not elsewhere classified: Secondary | ICD-10-CM

## 2016-07-11 NOTE — Therapy (Signed)
[place], [place], [address] Phone: [phone]   Fax:  [phone]  Physical Therapy Treatment  Patient Details  Name: Mary Young MRN: [id number] Date of Birth: November 15, 1941 Referring Provider: Dr. Lindi Adie  Encounter Date: 07/11/2016      PT End of Session - 07/11/16 1154    Visit Number 4   Number of Visits 9   Date for PT Re-Evaluation 07/24/16   PT Start Time 1020   PT Stop Time 1100   PT Time Calculation (min) 40 min   Activity Tolerance Patient tolerated treatment well   Behavior During Therapy [facility] for tasks assessed/performed      Past Medical History:  Diagnosis Date  . Allergy   . Arthritis   . Breast cancer ([place])    left lumpectomy 2003  . Cataract    left eye  . Central retinal vein occlusion   . Dyslipidemia   . GERD (gastroesophageal reflux disease)   . Glaucoma   . H/O echocardiogram 09/27/2009   Note to have some aortic valve sclerosis, mild mitral valve prolapse, mild mitral regurgitation, and mild asymptomatic LVH with an EF>55%  . Heart murmur   . History of stress test 09/27/2009   Normal Myocardial perfusion study. No significant ischemia demonstrated, this low risk scan. compared to the previous study.,there is no significant change, Normal myocardial Perfusion imaging. EF>83%  . Hyperlipidemia   . Hypertension   . Joint pain   . Osteoporosis   . Seasonal allergies   . Stroke [facility])    central retinal occusion - right eye    Past Surgical History:  Procedure Laterality Date  . ABDOMINAL HYSTERECTOMY  1991  . ARTHROSCOPY KNEE W/ DRILLING  2005/2008   Left knee/Right knee  . BREAST LUMPECTOMY  2003  . MASTECTOMY MODIFIED RADICAL Right 04/15/2013   Procedure: MASTECTOMY MODIFIED RADICAL;  Surgeon: Adin [name], MD;  Location: [place];  Service: General;  Laterality: Right;  . PORTACATH PLACEMENT Left 05/13/2013   Procedure: INSERTION PORT-A-CATH;  Surgeon:  Adin [name], MD;  Location: [place];  Service: General;  Laterality: Left;  . TUBAL LIGATION  1983    There were no vitals filed for this visit.      Subjective Assessment - 07/11/16 1024    Subjective I am not having any pain. I am doing fine with everything. I think today will be my last day. I am continued to use to emu cream. The exercises are going well. I even took them on vacation last week.    Pertinent History L breast cancer and lumpectomy in 2003, in 2014 pt underwent a right mastectomy for R breast cancer and received chemo and radiation, she is currently taking hormone pills   Patient Stated Goals to be able to get back to using my RUE without having pain in it   Currently in Pain? No/denies   Pain Score 0-No pain                         [facility] Adult PT Treatment/Exercise - 07/11/16 0001      Shoulder Exercises: Supine   Horizontal ABduction Strengthening;Both;10 reps;Theraband   Theraband Level (Shoulder Horizontal ABduction) Level 3 (Green)   External Rotation Strengthening;Both;10 reps;Theraband   Theraband Level (Shoulder External Rotation) Level 3 (Green)   Flexion Strengthening;Both;10 reps;Theraband   Theraband Level (Shoulder Flexion) Level 3 (Green)   Other Supine Exercises D2 with green  theraband x 10 reps bilaterally     Shoulder Exercises: Standing   External Rotation Strengthening;10 reps;Theraband;Both   Theraband Level (Shoulder External Rotation) Level 3 (Green)   Internal Rotation Strengthening;10 reps;Theraband;Both   Theraband Level (Shoulder Internal Rotation) Level 3 (Green)   Flexion Strengthening;10 reps;Theraband;Both   Theraband Level (Shoulder Flexion) Level 3 (Green)   Extension Strengthening;10 reps;Theraband;Both   Theraband Level (Shoulder Extension) Level 3 (Green)   Other Standing Exercises 3 way shoulder against wall with 2 lb weight x 10 reps in abd, scap, and flex                   Short Term Clinic  Goals - 07/11/16 1026      CC Short Term Goal  #1   Title Pt to demonstrate 40 degrees of right shoulder IR to allow pt to reach behind back   Baseline unable secondary to pain, 06/18/16- 69 degrees   Status Achieved     CC Short Term Goal  #2   Title Pt to able to independently verbalize lymphedema risk reduction practices   Baseline 06/18/16- pt educated today, 07/11/16- pt able to independently verbalize   Status Achieved     CC Short Term Goal  #3   Title Pt to receive appropriate compression garment for flying   Baseline 06/18/16- pt sent with information to obtain today, 07/11/16- pt states she has not obtained a sleeve because she does not plan on flying   Status Partially [place] - 07/11/16 1029      CC Long Term Goal  #1   Title Pt to demonstrate 170 degrees of right shoulder flexion to allow her to reach items overhead   Baseline 159, 06/18/16- 170 degrees, 07/11/16- 170 degrees   Status Achieved     CC Long Term Goal  #2   Title Pt to demonstrate 175 degrees of right shoulder abduction to allow her to reach items out to sides    Baseline 162, 06/18/16- 180 degrees, 07/11/16- 180 degrees   Status Achieved     CC Long Term Goal  #3   Title Pt to report a 75% decrease in pain in right shoulder/UE with UE movements for increased comfort   Baseline 06/18/16- 85% improvement, 07/11/16- 95% improvement   Status Achieved     CC Long Term Goal  #4   Title Pt to demonstrate 60 degrees of right shoulder IR to allow her to reach behind and wash her back/fasten bra   Baseline unable due to pain, 06/18/16- 69 degrees, 07/11/16- 80 degrees   Status Achieved     CC Long Term Goal  #5   Title Pt will be independent with a HEP for continued strengthening and ROM   Baseline 07/11/16- pt reports she is independent with her exercises and completes them 1x/day   Status Achieved            Plan - 07/11/16 1154    Clinical Impression Statement Patient has met  all goals for therapy. She was able to verbalize lymphedema risk reduction practices. She also has been compliant with her home exercise program and has been doing exercises daily. She has full range of motion of her right shoulder. Gave some verbal cues to patient to perform exercises correctly and gave pt new exercises to add to her home program.    Rehab Potential Good   Clinical Impairments Affecting  Rehab Potential hx of radiation, can only come 1x/wk   PT Frequency 1x / week   PT Duration 8 weeks   PT Treatment/Interventions Manual techniques;Manual lymph drainage;Therapeutic exercise;Taping;Electrical Stimulation;Patient/family education;Passive range of motion;Scar mobilization;ADLs/Self Care Home Management   PT Next Visit Plan dc this visit   PT Home Exercise Plan median nerve stretch; A/ROM in fornt of mirror to work on decreasing scapular compensation with Rt UE., scapular stabilization, rockwood exercises   Consulted and Agree with Plan of Care Patient      Patient will benefit from skilled therapeutic intervention in order to improve the following deficits and impairments:  Decreased range of motion, Pain, Impaired UE functional use, Increased fascial restricitons, Decreased strength, Increased edema, Decreased scar mobility  Visit Diagnosis: Pain in right shoulder  Stiffness of right shoulder, not elsewhere classified       G-Codes - 08/05/2016 1157    Functional Assessment Tool Used per clinical judgement   Functional Limitation Carrying, moving and handling objects   Carrying, Moving and Handling Objects Goal Status ([id number]) At least 1 percent but less than 20 percent impaired, limited or restricted   Carrying, Moving and Handling Objects Discharge Status [phone]) 0 percent impaired, limited or restricted      Problem List Patient Active Problem List   Diagnosis Date Noted  . Mild mitral valve prolapse 06/20/2014  . Palpitations 06/20/2014  . Essential hypertension  06/20/2014  . Breast cancer metastasized to axillary lymph node ([place]) 12/27/2012  . History of breast cancer 09/16/2012  . Primary cancer of upper outer quadrant of right female breast ([place]) 09/03/2012  . HYPERLIPIDEMIA 07/10/2007  . HEART MURMUR, HX OF 07/10/2007  . ARTHROSCOPY, KNEE, HX OF 07/10/2007    Alexia Freestone 08/05/2016, 11:58 AM  [place], [place], [address] Phone: [phone]   Fax:  [phone]  Name: Mary Young MRN: [id number] Date of Birth: 10-10-1941   Allyson Sabal, PT Aug 05, 2016 11:58 AM  PHYSICAL THERAPY DISCHARGE SUMMARY  Visits from Start of Care: 4  Current functional level related to goals / functional outcomes: Independent with HEP and has full shoulder ROM   Remaining deficits: none   Education / Equipment: Home exercise program  Plan: Patient agrees to discharge.  Patient goals were met. Patient is being discharged due to meeting the stated rehab goals.  ?????

## 2016-07-11 NOTE — Patient Instructions (Signed)
Abduction: Scaption - Thumb Up ([place])    Lift arms out from sides in a "V" shape, thumbs up. Repeat _10___ times per set. Do __1__ sets per session. Do _5__ sessions per week. Use _2___ lb weights.   Copyright  VHI. All rights reserved.   Flexion: Palm Down ([place])    Lift arms from thighs to shoulder height, palms down. Repeat __10__ times per set. Do __1__ sets per session. Do _5___ sessions per week. Use _2___ lb weights.  Copyright  VHI. All rights reserved.  Abduction ([place])    Stand with arms at sides. Lift arms out from sides, thumbs up. Repeat 10____ times per set. Do 1____ sets per session. Do __5__ sessions per week. Use _2___ lb weights.   Copyright  VHI. All rights reserved.

## 2016-08-09 ENCOUNTER — Other Ambulatory Visit: Payer: Self-pay | Admitting: Hematology and Oncology

## 2016-08-09 DIAGNOSIS — C50419 Malignant neoplasm of upper-outer quadrant of unspecified female breast: Secondary | ICD-10-CM

## 2016-09-04 ENCOUNTER — Ambulatory Visit: Payer: Medicare Other | Admitting: Hematology and Oncology

## 2016-09-08 ENCOUNTER — Encounter: Payer: Self-pay | Admitting: Hematology and Oncology

## 2016-09-08 ENCOUNTER — Ambulatory Visit (HOSPITAL_BASED_OUTPATIENT_CLINIC_OR_DEPARTMENT_OTHER): Payer: Medicare Other | Admitting: Hematology and Oncology

## 2016-09-08 ENCOUNTER — Telehealth: Payer: Self-pay | Admitting: Hematology and Oncology

## 2016-09-08 DIAGNOSIS — M791 Myalgia: Secondary | ICD-10-CM

## 2016-09-08 DIAGNOSIS — C50411 Malignant neoplasm of upper-outer quadrant of right female breast: Secondary | ICD-10-CM

## 2016-09-08 DIAGNOSIS — C773 Secondary and unspecified malignant neoplasm of axilla and upper limb lymph nodes: Secondary | ICD-10-CM | POA: Diagnosis not present

## 2016-09-08 DIAGNOSIS — M8580 Other specified disorders of bone density and structure, unspecified site: Secondary | ICD-10-CM

## 2016-09-08 DIAGNOSIS — R413 Other amnesia: Secondary | ICD-10-CM

## 2016-09-08 DIAGNOSIS — C50911 Malignant neoplasm of unspecified site of right female breast: Secondary | ICD-10-CM

## 2016-09-08 DIAGNOSIS — Z170 Estrogen receptor positive status [ER+]: Secondary | ICD-10-CM

## 2016-09-08 DIAGNOSIS — H539 Unspecified visual disturbance: Secondary | ICD-10-CM

## 2016-09-08 NOTE — Telephone Encounter (Signed)
AVS report and appointment schedule, given to patient, per 09/08/16 los.

## 2016-09-08 NOTE — Progress Notes (Signed)
Patient Care Team: Kelton Pillar, MD as PCP - General (Family Medicine) Marcy Panning, MD as Consulting Physician (Hematology and Oncology)  DIAGNOSIS:  Encounter Diagnosis  Name Primary?  . 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 breast ([place])   08/30/2002 Surgery    Left lumpectomy and radiation showed DCIS ER/PR negative      08/04/2012 Initial Diagnosis    Right breast mass 4.7 x 3.6 x 5.2, lymph node measuring 2 cm, invasive mammary cancer ER/PR positive HER-2 negative Ki-67 66%, lymph node positive , Oncotype DX score 28, 18% ROR      09/30/2012 - 04/30/2013 Anti-estrogen oral therapy    Neoadjuvant letrozole 2.5 mg daily      04/15/2013 Surgery    Right breast mastectomy: Residual invasive ductal carcinoma grade 2, 4.3 cm, intermediate grade DCIS, angiolymphatic invasion, 5/23 lymph nodes positive, T2 N2 M0 stage IIIa ER 100% PR 0% HER-2 negative ratio 0.91      05/23/2013 - 08/30/2013 Chemotherapy    Adjuvant chemotherapy [facility]      09/22/2013 - 11/11/2013 Radiation Therapy    Adjuvant radiation therapy      11/29/2013 -  Anti-estrogen oral therapy    Arimidex 1 mg daily       CHIEF COMPLIANT: Follow-up on Arimidex therapy  INTERVAL HISTORY: Mary Young is a 75 year old with above-mentioned history of right breast cancer treated with mastectomy followed by adjuvant chemotherapy and radiation she is currently on adjuvant antiestrogen therapy with Arimidex. She is tolerating Arimidex fairly well. She denies significant myalgias or arthralgias.  Patient complains of occasional visual changes and also has had some memory problems. She was starting to get anxious of these may be signs of brain metastases. She denies any other focal neurological symptoms. Apparently patient has a previous history of central retinal vein occlusion.  REVIEW OF SYSTEMS:     Constitutional: Denies fevers, chills or abnormal weight loss Eyes: Denies blurriness of vision Ears, nose, mouth, throat, and face: Denies mucositis or sore throat Respiratory: Denies cough, dyspnea or wheezes Cardiovascular: Denies palpitation, chest discomfort Gastrointestinal:  Denies nausea, heartburn or change in bowel habits Skin: Denies abnormal skin rashes Lymphatics: Denies new lymphadenopathy or easy bruising Neurological:Denies numbness, tingling or new weaknesses Behavioral/Psych: Mood is stable, no new changes  Extremities: No lower extremity edema Breast:  denies any pain or lumps or nodules in either breasts All other systems were reviewed with the patient and are negative.  I have reviewed the past medical history, past surgical history, social history and family history with the patient and they are unchanged from previous note.  ALLERGIES:  is allergic to vicodin [hydrocodone-acetaminophen]; brimonidine tartrate-timolol; dorzolamide hcl-timolol mal; percocet [oxycodone-acetaminophen]; precipitated sulfur [sulfur]; and timolol.  MEDICATIONS:  Current Outpatient Prescriptions  Medication Sig Dispense Refill  . anastrozole (ARIMIDEX) 1 MG tablet TAKE ONE TABLET BY MOUTH ONCE DAILY 90 tablet 3  . aspirin 325 MG tablet Take 325 mg by mouth 2 (two) times daily as needed.    Marland Kitchen aspirin 81 MG tablet Take 81 mg by mouth 2 (two) times daily.    . calcium carbonate (TUMS - DOSED IN MG ELEMENTAL CALCIUM) 500 MG chewable tablet Chew 2 tablets by mouth daily.    . cholecalciferol (VITAMIN D) 400 UNITS TABS tablet Take 400 Units by mouth 2 (two) times daily. Reported on 05/29/2016    .  cyanocobalamin 500 MCG tablet Take 500 mcg by mouth daily. Reported on 05/29/2016    . losartan (COZAAR) 50 MG tablet Take 50 mg by mouth daily.    . Multiple Vitamin (MULTI-VITAMIN) tablet Take 1 tablet by mouth.    . pilocarpine (PILOCAR) 1 % ophthalmic solution Place 1 drop into both eyes 4 (four)  times daily. Eye surgery scheduled for 09/12/14    . rosuvastatin (CRESTOR) 5 MG tablet Take 5 mg by mouth daily.    . timolol (TIMOPTIC) 0.5 % ophthalmic solution Place 1 drop into both eyes 2 (two) times daily. Non preservative    . TRAVATAN Z 0.004 % SOLN ophthalmic solution Place 1 drop into both eyes at bedtime. Reported on 05/29/2016    . UNABLE TO FIND Rx: L8000- Post Surgical Bras (Quantity: 6) M3846- Silicone Breast Prosthesis (Quantity: 1) Dx: 174.9; Right mastectomy 1 each 0   No current facility-administered medications for this visit.    Facility-Administered Medications Ordered in Other Visits  Medication Dose Route Frequency Provider Last Rate Last Dose  . 0.9 %  sodium chloride infusion   Intravenous Once [name], NP        PHYSICAL EXAMINATION: ECOG PERFORMANCE STATUS: 1 - Symptomatic but completely ambulatory  Vitals:   09/08/16 1136  BP: (!) 181/84  Pulse: 74  Resp: 20  Temp: 98.1 F (36.7 C)   Filed Weights   09/08/16 1136  Weight: 137 lb 4 oz (62.3 kg)    GENERAL:alert, no distress and comfortable SKIN: skin color, texture, turgor are normal, no rashes or significant lesions EYES: normal, Conjunctiva are pink and non-injected, sclera clear OROPHARYNX:no exudate, no erythema and lips, buccal mucosa, and tongue normal  NECK: supple, thyroid normal size, non-tender, without nodularity LYMPH:  no palpable lymphadenopathy in the cervical, axillary or inguinal LUNGS: clear to auscultation and percussion with normal breathing effort HEART: regular rate & rhythm and no murmurs and no lower extremity edema ABDOMEN:abdomen soft, non-tender and normal bowel sounds MUSCULOSKELETAL:no cyanosis of digits and no clubbing  NEURO: alert & oriented x 3 with fluent speech, no focal motor/sensory deficits EXTREMITIES: No lower extremity edema BREAST: No palpable masses or nodules in either right or left breasts. No palpable axillary supraclavicular or  infraclavicular adenopathy no breast tenderness or nipple discharge. (exam performed in the presence of a chaperone)  LABORATORY DATA:  I have reviewed the data as listed   Chemistry      Component Value Date/Time   NA 144 03/01/2015 1424   K 4.0 03/01/2015 1424   CL 106 05/11/2013 0940   CL 107 03/22/2013 0811   CO2 31 (H) 03/01/2015 1424   BUN 22.4 03/01/2015 1424   CREATININE 0.7 03/01/2015 1424      Component Value Date/Time   CALCIUM 9.2 03/01/2015 1424   ALKPHOS 86 03/01/2015 1424   AST 22 03/01/2015 1424   ALT 21 03/01/2015 1424   BILITOT 0.44 03/01/2015 1424       Lab Results  Component Value Date   WBC 4.0 03/01/2015   HGB 12.4 03/01/2015   HCT 37.1 03/01/2015   MCV 94.7 03/01/2015   PLT 214 03/01/2015   NEUTROABS 2.3 03/01/2015     ASSESSMENT & PLAN:  Breast cancer metastasized to axillary lymph node Stage IIIa ER/PR positive HER-2 negative invasive mammary cancer right breast status post few months of neoadjuvant antiestrogen therapy followed by mastectomy followed by adjuvant chemotherapy and radiation currently on anastrozole 1 mg daily since January 2015,  and indeterminate liver lesion seen on initial PET CT scan was followed up and was found to be normal in April 2015.  Anastrozole toxicities: 1. Occasional hot flashes 2. Mild musculoskeletal aches and pains 3. Bone density November 2015 showed osteopenia -1.3  Visual problems: I discussed the patient that this is not related to the breast cancer or anastrozole therapy. She needs to see her ophthalmologist to discuss this further. Memory problems: I encouraged her to take part in [email or web address] for brain training. If patient continues to have worsening symptoms, she may need a brain MRI. At this point in time I do not see any clear indication for that.  Breast cancer surveillance: 1. Mammogram done 01/08/2016 was normal the left breast 2. Breast and chest wall exam 09/08/2016 is  normal  Survivorship:encouraged the patient to stay active and eat more fruits and vegetables.  Return to clinic in 1 year for follow-up.   No orders of the defined types were placed in this encounter.  The patient has a good understanding of the overall plan. she agrees with it. she will call with any problems that may develop before the next visit here.   Rulon Eisenmenger, MD 09/08/16

## 2016-09-08 NOTE — Assessment & Plan Note (Signed)
Stage IIIa ER/PR positive HER-2 negative invasive mammary cancer right breast status post few months of neoadjuvant antiestrogen therapy followed by mastectomy followed by adjuvant chemotherapy and radiation currently on anastrozole 1 mg daily since January 2015, and indeterminate liver lesion seen on initial PET CT scan was followed up and was found to be normal in April 2015.  Anastrozole toxicities: 1. Occasional hot flashes 2. Mild musculoskeletal aches and pains 3. Bone density November 2015 showed osteopenia -1.3  Breast cancer surveillance: 1. Mammogram done 01/08/2016 was normal the left breast 2. Breast and chest wall exam 09/08/2016 is normal  Survivorship:encouraged the patient to stay active and eat more fruits and vegetables.  Return to clinic in 1 year for follow-up.

## 2016-12-09 ENCOUNTER — Other Ambulatory Visit (HOSPITAL_COMMUNITY): Payer: Self-pay | Admitting: Family Medicine

## 2016-12-09 DIAGNOSIS — Z1231 Encounter for screening mammogram for malignant neoplasm of breast: Secondary | ICD-10-CM

## 2017-01-07 ENCOUNTER — Encounter (HOSPITAL_COMMUNITY): Payer: Self-pay

## 2017-01-07 ENCOUNTER — Ambulatory Visit (HOSPITAL_COMMUNITY)
Admission: RE | Admit: 2017-01-07 | Discharge: 2017-01-07 | Disposition: A | Discharge status: Home / Self Care | Payer: Medicare Other | Source: Ambulatory Visit | Attending: Family Medicine | Admitting: Family Medicine

## 2017-01-07 DIAGNOSIS — Z1231 Encounter for screening mammogram for malignant neoplasm of breast: Secondary | ICD-10-CM | POA: Diagnosis not present

## 2017-01-12 ENCOUNTER — Other Ambulatory Visit: Payer: Self-pay | Admitting: Family Medicine

## 2017-01-12 DIAGNOSIS — M8580 Other specified disorders of bone density and structure, unspecified site: Secondary | ICD-10-CM

## 2017-01-22 ENCOUNTER — Ambulatory Visit
Admission: RE | Admit: 2017-01-22 | Discharge: 2017-01-22 | Disposition: A | Discharge status: Home / Self Care | Payer: Medicare Other | Source: Ambulatory Visit | Attending: Family Medicine | Admitting: Family Medicine

## 2017-01-22 DIAGNOSIS — M8580 Other specified disorders of bone density and structure, unspecified site: Secondary | ICD-10-CM

## 2017-01-29 ENCOUNTER — Encounter: Payer: Self-pay | Admitting: Hematology and Oncology

## 2017-02-02 ENCOUNTER — Other Ambulatory Visit: Payer: Self-pay

## 2017-02-10 ENCOUNTER — Telehealth: Payer: Self-pay | Admitting: Gastroenterology

## 2017-02-10 NOTE — Telephone Encounter (Signed)
Pt aware that next colon is due in 2020.  She will call if she has any problems or concerns prior to that time.

## 2017-03-30 ENCOUNTER — Encounter: Payer: Self-pay | Admitting: Family Medicine

## 2017-03-30 ENCOUNTER — Ambulatory Visit (INDEPENDENT_AMBULATORY_CARE_PROVIDER_SITE_OTHER): Payer: Medicare Other | Admitting: Family Medicine

## 2017-03-30 VITALS — BP 126/80 | HR 70 | Temp 97.9°F | Ht 64.0 in | Wt 135.2 lb

## 2017-03-30 DIAGNOSIS — J302 Other seasonal allergic rhinitis: Secondary | ICD-10-CM

## 2017-03-30 DIAGNOSIS — M545 Low back pain, unspecified: Secondary | ICD-10-CM

## 2017-03-30 DIAGNOSIS — Z7689 Persons encountering health services in other specified circumstances: Secondary | ICD-10-CM | POA: Diagnosis not present

## 2017-03-30 DIAGNOSIS — C773 Secondary and unspecified malignant neoplasm of axilla and upper limb lymph nodes: Secondary | ICD-10-CM | POA: Diagnosis not present

## 2017-03-30 DIAGNOSIS — C50911 Malignant neoplasm of unspecified site of right female breast: Secondary | ICD-10-CM | POA: Diagnosis not present

## 2017-03-30 NOTE — Progress Notes (Signed)
Patient ID: Mary Young, female   DOB: June 07, 1941, 76 y.o.   MRN: [id number]  Patient presents to clinic today to establish care.   Dull ache in her lower back that has been present for over one year. Ache is described as warm and she will notice it more in the evening. BACK PAIN Location: Lower back  Quality: Ache Onset: > 1 year Worse with: nothing     Better with:  nothing  Radiation: None Trauma: No history Best sitting/standing/leaning forward: Unsure Treatment with OTC acetaminophen has provided benefit.  Red Flags Fecal/urinary incontinence: No Numbness/Weakness: No Fever/chills/sweats: No Night pain: No Unexplained weight loss: No No relief with bedrest:  No h/o cancer/immunosuppression:  2014 mastectomy with chemotherapy; she is followed by oncology. IV drug use:  None PMH of osteoporosis or chronic steroid use: Bone density in 01/22/2017 indicated T-score -1.4.  Allergic rhinitis: itchy/watery eyes and rhinitis. She denies fever, chills, sweats, N/V, nasal congestion, and sinus pressure/pain.  History of right breast cancer that was treated with mastectomy followed by chemotherapy and radiation. She is currently on arimidex provided by oncology. She is followed by oncology and mammogram 12/2016 indicated no evidence of malignancy with recommendation of screening in one year.   Health Maintenance: Dental -- Once yearly Vision -- Every 3 months due to glaucoma; wears glasses Immunizations -- Influenza UTD 10/17; Tdap UTD Colonoscopy -- UTD; next due in 2020 per patient report PAP -- No longer needed Bone Density -- UTD Mammogram: UTD   Past Medical History:  Diagnosis Date  . Allergy   . Arthritis   . Breast cancer ([place])    left lumpectomy 2003  . Cataract    left eye  . Central retinal vein occlusion   . Dyslipidemia   . GERD (gastroesophageal reflux disease)   . Glaucoma   . H/O echocardiogram 09/27/2009   Note to have some aortic valve sclerosis, mild  mitral valve prolapse, mild mitral regurgitation, and mild asymptomatic LVH with an EF>55%  . Heart murmur   . History of stress test 09/27/2009   Normal Myocardial perfusion study. No significant ischemia demonstrated, this low risk scan. compared to the previous study.,there is no significant change, Normal myocardial Perfusion imaging. EF>83%  . Hyperlipidemia   . Hypertension   . Joint pain   . Osteoporosis   . Seasonal allergies   . Stroke [facility])    central retinal occusion - right eye    Past Surgical History:  Procedure Laterality Date  . ABDOMINAL HYSTERECTOMY  1991  . ARTHROSCOPY KNEE W/ DRILLING  2005/2008   Left knee/Right knee  . BREAST LUMPECTOMY  2003  . EXCISION / BIOPSY BREAST / NIPPLE / DUCT Left   . MASTECTOMY Right 2014  . MASTECTOMY MODIFIED RADICAL Right 04/15/2013   Procedure: MASTECTOMY MODIFIED RADICAL;  Surgeon: Adin [name], MD;  Location: [facility];  Service: General;  Laterality: Right;  . PORTACATH PLACEMENT Left 05/13/2013   Procedure: INSERTION PORT-A-CATH;  Surgeon: Adin [name], MD;  Location: [facility];  Service: General;  Laterality: Left;  . TUBAL LIGATION  1983    Current Outpatient Prescriptions on File Prior to Visit  Medication Sig Dispense Refill  . anastrozole (ARIMIDEX) 1 MG tablet TAKE ONE TABLET BY MOUTH ONCE DAILY 90 tablet 3  . aspirin 325 MG tablet Take 325 mg by mouth 2 (two) times daily as needed.    Marland Kitchen aspirin 81 MG tablet Take 81 mg by mouth 2 (two) times daily.    Marland Kitchen  calcium carbonate (TUMS - DOSED IN MG ELEMENTAL CALCIUM) 500 MG chewable tablet Chew 2 tablets by mouth daily.    . cholecalciferol (VITAMIN D) 400 UNITS TABS tablet Take 400 Units by mouth 2 (two) times daily. Reported on 05/29/2016    . cyanocobalamin 500 MCG tablet Take 500 mcg by mouth daily. Reported on 05/29/2016    . losartan (COZAAR) 50 MG tablet Take 50 mg by mouth daily.    . Multiple Vitamin (MULTI-VITAMIN) tablet Take 1 tablet by mouth.    . pilocarpine  (PILOCAR) 1 % ophthalmic solution Place 1 drop into both eyes 3 (three) times daily. Eye surgery scheduled for 09/12/14     . rosuvastatin (CRESTOR) 5 MG tablet Take 5 mg by mouth daily.    . timolol (TIMOPTIC) 0.5 % ophthalmic solution Place 1 drop into both eyes 2 (two) times daily. Non preservative    . TRAVATAN Z 0.004 % SOLN ophthalmic solution Place 1 drop into both eyes at bedtime. Reported on 05/29/2016    . UNABLE TO FIND Rx: L8000- Post Surgical Bras (Quantity: 6) N8295- Silicone Breast Prosthesis (Quantity: 1) Dx: 174.9; Right mastectomy 1 each 0   Current Facility-Administered Medications on File Prior to Visit  Medication Dose Route Frequency Provider Last Rate Last Dose  . 0.9 %  sodium chloride infusion   Intravenous Once Causey, Charlestine Massed, NP        Allergies  Allergen Reactions  . Vicodin [Hydrocodone-Acetaminophen] Nausea And Vomiting  . Brimonidine Tartrate-Timolol   . Dorzolamide Hcl-Timolol Mal   . Percocet [Oxycodone-Acetaminophen] Nausea And Vomiting  . Precipitated Sulfur [Sulfur] Itching    Eye medication  . Timolol Rash    Eye medication with preservatives    Family History  Problem Relation Age of Onset  . Cancer Brother        tumor near liver pancrese   half brother  . Colon cancer Neg Hx   . Esophageal cancer Neg Hx   . Pancreatic cancer Neg Hx   . Rectal cancer Neg Hx   . Stomach cancer Neg Hx     Social History   Social History  . Marital status: Married    Spouse name: N/A  . Number of children: 3  . Years of education: N/A   Occupational History  . Retired     Web designer   Social History Main Topics  . Smoking status: Never Smoker  . Smokeless tobacco: Never Used  . Alcohol use Yes     Comment: occasional glass of wine  . Drug use: No  . Sexual activity: Yes   Other Topics Concern  . Not on file   Social History Narrative   Married   3 sons   6 grandchildren 3/3.    Review of Systems   Constitutional: Negative for chills, fever and malaise/fatigue.  HENT: Negative for congestion, sinus pain and sore throat.   Eyes:       Itchy/watery eyes  Respiratory: Negative for cough, sputum production and shortness of breath.   Cardiovascular: Negative for chest pain, palpitations and leg swelling.  Gastrointestinal: Negative for abdominal pain, diarrhea, heartburn, nausea and vomiting.  Genitourinary: Negative for dysuria, frequency and urgency.  Musculoskeletal: Positive for back pain.  Neurological: Negative for dizziness, tingling and headaches.  Psychiatric/Behavioral:       Denies depressed or anxious mood    BP 126/80 (BP Location: Left Arm, Patient Position: Sitting, Cuff Size: Normal)   Pulse 70   Temp 97.9  F (36.6 C) (Oral)   Ht 5\' 4"  (1.626 m)   Wt 135 lb 3.2 oz (61.3 kg)   SpO2 97%   BMI 23.21 kg/m   Physical Exam  Constitutional: She is oriented to person, place, and time and well-developed, well-nourished, and in no distress.  Eyes: Pupils are equal, round, and reactive to light.  Neck: Normal range of motion. Neck supple.  Cardiovascular: Normal rate, regular rhythm and intact distal pulses.   Pulmonary/Chest: Effort normal and breath sounds normal. She has no wheezes. She has no rales.  Abdominal: Soft. Bowel sounds are normal. There is no tenderness.  Musculoskeletal:  Spine with normal alignment and no deformity. No tenderness to vertebral process with palpation.  Paraspinous muscles left side are tender and patient notes this area as a dull ache. Negative Straight Leg raise. No CVA tenderness present. Able to heel/toe walk without pain.  Lymphadenopathy:    She has no cervical adenopathy.  Neurological: She is alert and oriented to person, place, and time. Gait normal.  Skin: Skin is warm and dry. No rash noted.  Psychiatric: Mood, memory, affect and judgment normal.     Assessment/Plan: 1. Left-sided low back pain without sciatica, unspecified  chronicity Exam and history are reassuring. We discussed stretching exercises and trial of OTC acetaminophen for discomfort if needed. Advised follow up if symptoms do not improve with treatment, worsen, or she develops worsening or new symtpoms.  2. Seasonal allergic rhinitis, unspecified trigger Mild symptoms that are managed with OTC allergy medication if needed.  3. 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 Will follow up as planned.  4. Encounter to establish care We reviewed the PMH, PSH, FH, SH, Meds and Allergies. -We provided refills for any medications we will prescribe as needed. -We addressed current concerns per orders and patient instructions. -We have asked for records for pertinent exams, studies, vaccines and notes from previous providers. -We have advised patient to follow up per instructions below.   -Patient advised to return or notify a provider immediately if symptoms worsen or persist or new concerns arise.  Follow up for a wellness visit in November and sooner for additional concerns.  Delano Metz, FNP-C

## 2017-03-30 NOTE — Patient Instructions (Signed)
It was a pleasure to meet you today! Please continue with OTC acetaminophen as needed for back discomfort. If symptoms do not improve with treatment, worsen, or you develop new symptoms, please follow up for further evaluation and treatment.  Also, please make an appointment to review additional concerns.  Wellness visit should also be scheduled in November.   Back Pain, Adult Back pain is very common. The pain often gets better over time. The cause of back pain is usually not dangerous. Most people can learn to manage their back pain on their own. Follow these instructions at home: Watch your back pain for any changes. The following actions may help to lessen any pain you are feeling:  Stay active. Start with short walks on flat ground if you can. Try to walk farther each day.  Exercise regularly as told by your doctor. Exercise helps your back heal faster. It also helps avoid future injury by keeping your muscles strong and flexible.  Do not sit, drive, or stand in one place for more than 30 minutes.  Do not stay in bed. Resting more than 1-2 days can slow down your recovery.  Be careful when you bend or lift an object. Use good form when lifting:  Bend at your knees.  Keep the object close to your body.  Do not twist.  Sleep on a firm mattress. Lie on your side, and bend your knees. If you lie on your back, put a pillow under your knees.  Take medicines only as told by your doctor.  Put ice on the injured area.  Put ice in a plastic bag.  Place a towel between your skin and the bag.  Leave the ice on for 20 minutes, 2-3 times a day for the first 2-3 days. After that, you can switch between ice and heat packs.  Avoid feeling anxious or stressed. Find good ways to deal with stress, such as exercise.  Maintain a healthy weight. Extra weight puts stress on your back. Contact a doctor if:  You have pain that does not go away with rest or medicine.  You have worsening pain  that goes down into your legs or buttocks.  You have pain that does not get better in one week.  You have pain at night.  You lose weight.  You have a fever or chills. Get help right away if:  You cannot control when you poop (bowel movement) or pee (urinate).  Your arms or legs feel weak.  Your arms or legs lose feeling (numbness).  You feel sick to your stomach (nauseous) or throw up (vomit).  You have belly (abdominal) pain.  You feel like you may pass out (faint). This information is not intended to replace advice given to you by your health care provider. Make sure you discuss any questions you have with your health care provider. Document Released: 04/21/2008 Document Revised: 04/10/2016 Document Reviewed: 03/07/2014 Elsevier Interactive Patient Education  2017 [employer].

## 2017-05-13 ENCOUNTER — Encounter: Payer: Self-pay | Admitting: Family Medicine

## 2017-05-14 ENCOUNTER — Ambulatory Visit: Payer: Medicare Other | Admitting: Family Medicine

## 2017-08-06 ENCOUNTER — Encounter: Payer: Self-pay | Admitting: Family Medicine

## 2017-09-03 ENCOUNTER — Other Ambulatory Visit: Payer: Self-pay | Admitting: Hematology and Oncology

## 2017-09-03 DIAGNOSIS — C50419 Malignant neoplasm of upper-outer quadrant of unspecified female breast: Secondary | ICD-10-CM

## 2017-09-04 ENCOUNTER — Other Ambulatory Visit: Payer: Self-pay

## 2017-09-04 MED ORDER — ANASTROZOLE 1 MG PO TABS: 1.0000 mg | ORAL_TABLET | Freq: Every day | ORAL | 3 refills | Status: DC

## 2017-09-04 NOTE — Telephone Encounter (Signed)
Refilled today

## 2017-09-07 ENCOUNTER — Telehealth: Payer: Self-pay | Admitting: Hematology and Oncology

## 2017-09-07 NOTE — Telephone Encounter (Signed)
Left message for patient regarding the rescheduling of her appts due to Dr. Lindi Adie being out of the office

## 2017-09-08 ENCOUNTER — Ambulatory Visit: Payer: Medicare Other | Admitting: Hematology and Oncology

## 2017-09-14 ENCOUNTER — Ambulatory Visit: Payer: Medicare Other | Admitting: Hematology and Oncology

## 2017-09-21 ENCOUNTER — Ambulatory Visit: Payer: Medicare Other | Admitting: Hematology and Oncology

## 2017-09-21 ENCOUNTER — Telehealth: Payer: Self-pay | Admitting: Hematology and Oncology

## 2017-09-21 DIAGNOSIS — R5383 Other fatigue: Secondary | ICD-10-CM | POA: Diagnosis not present

## 2017-09-21 DIAGNOSIS — Z170 Estrogen receptor positive status [ER+]: Secondary | ICD-10-CM | POA: Diagnosis not present

## 2017-09-21 DIAGNOSIS — C50411 Malignant neoplasm of upper-outer quadrant of right female breast: Secondary | ICD-10-CM | POA: Diagnosis not present

## 2017-09-21 DIAGNOSIS — M810 Age-related osteoporosis without current pathological fracture: Secondary | ICD-10-CM

## 2017-09-21 DIAGNOSIS — Z79811 Long term (current) use of aromatase inhibitors: Secondary | ICD-10-CM

## 2017-09-21 NOTE — Telephone Encounter (Signed)
Gave patient avs and calendar with appt per 11/5 los.  °

## 2017-09-21 NOTE — Progress Notes (Signed)
Patient Care Team: Delano Metz, [place] as PCP - General (Family Medicine) Marcy Panning, MD as Consulting Physician (Hematology and Oncology)  DIAGNOSIS:  Encounter Diagnosis  Name Primary?  . Primary cancer of upper outer quadrant of right female breast ([place])     SUMMARY OF ONCOLOGIC HISTORY:   Primary cancer of upper outer quadrant of right female breast (Mary Young)   08/30/2002 Surgery    Left lumpectomy and radiation showed DCIS ER/PR negative      08/04/2012 Initial Diagnosis    Right breast mass 4.7 x 3.6 x 5.2, lymph node measuring 2 cm, invasive mammary cancer ER/PR positive HER-2 negative Ki-67 66%, lymph node positive , Oncotype DX score 28, 18% ROR      09/30/2012 - 04/30/2013 Anti-estrogen oral therapy    Neoadjuvant letrozole 2.5 mg daily      04/15/2013 Surgery    Right breast mastectomy: Residual invasive ductal carcinoma grade 2, 4.3 cm, intermediate grade DCIS, angiolymphatic invasion, 5/23 lymph nodes positive, T2 N2 M0 stage IIIa ER 100% PR 0% HER-2 negative ratio 0.91      05/23/2013 - 08/30/2013 Chemotherapy    Adjuvant chemotherapy [facility]      09/22/2013 - 11/11/2013 Radiation Therapy    Adjuvant radiation therapy      11/29/2013 -  Anti-estrogen oral therapy    Arimidex 1 mg daily       CHIEF COMPLIANT: Complains of fatigue  INTERVAL HISTORY: Mary Young is a 72-year with above-mentioned history of right mastectomy followed by adjuvant chemotherapy and radiation is currently on Arimidex therapy since January 2015.  She has been tolerating Arimidex reasonably well.  She complains of fatigue.  She is trying to see Dr. Felipa Eth today as a new patient to get evaluated for the fatigue.  She denies any lumps or nodules in the breast.  She has had intermittent sharp discomfort in the breast periodically.  These have been less often since she started exercising.  REVIEW OF SYSTEMS:   Constitutional: Denies fevers, chills or abnormal weight loss Eyes:  Denies blurriness of vision Ears, nose, mouth, throat, and face: Denies mucositis or sore throat Respiratory: Denies cough, dyspnea or wheezes Cardiovascular: Denies palpitation, chest discomfort Gastrointestinal:  Denies nausea, heartburn or change in bowel habits Skin: Denies abnormal skin rashes Lymphatics: Denies new lymphadenopathy or easy bruising Neurological:Denies numbness, tingling or new weaknesses Behavioral/Psych: Mood is stable, no new changes  Extremities: No lower extremity edema Breast: Intermittent sharp pains in the right breast All other systems were reviewed with the patient and are negative.  I have reviewed the past medical history, past surgical history, social history and family history with the patient and they are unchanged from previous note.  ALLERGIES:  is allergic to vicodin [hydrocodone-acetaminophen]; brimonidine tartrate-timolol; dorzolamide hcl-timolol mal; percocet [oxycodone-acetaminophen]; precipitated sulfur [sulfur]; and timolol.  MEDICATIONS:  Current Outpatient Medications  Medication Sig Dispense Refill  . anastrozole (ARIMIDEX) 1 MG tablet TAKE ONE TABLET BY MOUTH ONCE DAILY 90 tablet 3  . anastrozole (ARIMIDEX) 1 MG tablet Take 1 tablet (1 mg total) by mouth daily. 90 tablet 3  . aspirin 325 MG tablet Take 325 mg by mouth 2 (two) times daily as needed.    Marland Kitchen aspirin 81 MG tablet Take 81 mg by mouth 2 (two) times daily.    . calcium carbonate (TUMS - DOSED IN MG ELEMENTAL CALCIUM) 500 MG chewable tablet Chew 2 tablets by mouth daily.    . cholecalciferol (VITAMIN D) 400 UNITS TABS tablet Take  400 Units by mouth 2 (two) times daily. Reported on 05/29/2016    . cyanocobalamin 500 MCG tablet Take 500 mcg by mouth daily. Reported on 05/29/2016    . Ginkgo Biloba 60 MG CAPS Take by mouth.    . losartan (COZAAR) 50 MG tablet Take 50 mg by mouth daily.    . Multiple Vitamin (MULTI-VITAMIN) tablet Take 1 tablet by mouth.    . pilocarpine (PILOCAR) 1 %  ophthalmic solution Place 1 drop into both eyes 3 (three) times daily. Eye surgery scheduled for 09/12/14     . Polyethyl Glycol-Propyl Glycol (SYSTANE OP) Apply 1 drop to eye as needed.    . rosuvastatin (CRESTOR) 5 MG tablet Take 5 mg by mouth daily.    . timolol (TIMOPTIC) 0.5 % ophthalmic solution Place 1 drop into both eyes 2 (two) times daily. Non preservative    . TRAVATAN Z 0.004 % SOLN ophthalmic solution Place 1 drop into both eyes at bedtime. Reported on 05/29/2016    . UNABLE TO FIND Rx: L8000- Post Surgical Bras (Quantity: 6) [id number]- Silicone Breast Prosthesis (Quantity: 1) Dx: 174.9; Right mastectomy 1 each 0   No current facility-administered medications for this visit.    Facility-Administered Medications Ordered in Other Visits  Medication Dose Route Frequency Provider Last Rate Last Dose  . 0.9 %  sodium chloride infusion   Intravenous Once Causey, Charlestine Massed, NP        PHYSICAL EXAMINATION: ECOG PERFORMANCE STATUS: 1 - Symptomatic but completely ambulatory  Vitals:   09/21/17 1019  BP: (!) 148/69  Pulse: 78  Resp: 18  Temp: 98.3 F (36.8 C)  SpO2: 96%   Filed Weights   09/21/17 1019  Weight: 135 lb 3.2 oz (61.3 kg)    GENERAL:alert, no distress and comfortable SKIN: skin color, texture, turgor are normal, no rashes or significant lesions EYES: normal, Conjunctiva are pink and non-injected, sclera clear OROPHARYNX:no exudate, no erythema and lips, buccal mucosa, and tongue normal  NECK: supple, thyroid normal size, non-tender, without nodularity LYMPH:  no palpable lymphadenopathy in the cervical, axillary or inguinal LUNGS: clear to auscultation and percussion with normal breathing effort HEART: regular rate & rhythm and no murmurs and no lower extremity edema ABDOMEN:abdomen soft, non-tender and normal bowel sounds MUSCULOSKELETAL:no cyanosis of digits and no clubbing  NEURO: alert & oriented x 3 with fluent speech, no focal motor/sensory  deficits EXTREMITIES: No lower extremity edema BREAST: No palpable masses or nodules in either right or left breasts. No palpable axillary supraclavicular or infraclavicular adenopathy no breast tenderness or nipple discharge. (exam performed in the presence of a chaperone)  LABORATORY DATA:  I have reviewed the data as listed   Chemistry      Component Value Date/Time   NA 144 03/01/2015 1424   K 4.0 03/01/2015 1424   CL 106 05/11/2013 0940   CL 107 03/22/2013 0811   CO2 31 (H) 03/01/2015 1424   BUN 22.4 03/01/2015 1424   CREATININE 0.7 03/01/2015 1424      Component Value Date/Time   CALCIUM 9.2 03/01/2015 1424   ALKPHOS 86 03/01/2015 1424   AST 22 03/01/2015 1424   ALT 21 03/01/2015 1424   BILITOT 0.44 03/01/2015 1424       Lab Results  Component Value Date   WBC 4.0 03/01/2015   HGB 12.4 03/01/2015   HCT 37.1 03/01/2015   MCV 94.7 03/01/2015   PLT 214 03/01/2015   NEUTROABS 2.3 03/01/2015    ASSESSMENT &  PLAN:  Primary cancer of upper outer quadrant of right female breast Stage IIIa ER/PR positive HER-2 negative invasive mammary cancer right breast status post few months of neoadjuvant antiestrogen therapy followed by mastectomy followed by adjuvant chemotherapy and radiation currently on anastrozole 1 mg daily since January 2015, and indeterminate liver lesion seen on initial PET CT scan was followed up and was found to be normal in April 2015.  Anastrozole toxicities: 1. Occasional hot flashes 2. Mild musculoskeletal aches and pains 3. Bone density November 2015 showed osteopenia -1.3  Breast cancer surveillance: 1. Mammogram done 01/07/2017 was normal the left breast 2. Breast and chest wall exam 09/21/2017 is normal.  Breast density category C  Return to clinic in 1 year for follow-up     I spent 25 minutes talking to the patient of which more than half was spent in counseling and coordination of care.  No orders of the defined types were placed in  this encounter.  The patient has a good understanding of the overall plan. she agrees with it. she will call with any problems that may develop before the next visit here.   Rulon Eisenmenger, MD 09/21/17

## 2017-09-21 NOTE — Assessment & Plan Note (Signed)
Stage IIIa ER/PR positive HER-2 negative invasive mammary cancer right breast status post few months of neoadjuvant antiestrogen therapy followed by mastectomy followed by adjuvant chemotherapy and radiation currently on anastrozole 1 mg daily since January 2015, and indeterminate liver lesion seen on initial PET CT scan was followed up and was found to be normal in April 2015.  Anastrozole toxicities: 1. Occasional hot flashes 2. Mild musculoskeletal aches and pains 3. Bone density November 2015 showed osteopenia -1.3  Breast cancer surveillance: 1. Mammogram done 01/07/2017 was normal the left breast 2. Breast and chest wall exam 09/21/2017 is normal.  Breast density category C  Return to clinic in 1 year for follow-up

## 2017-10-23 ENCOUNTER — Ambulatory Visit: Payer: Medicare Other | Admitting: Family Medicine

## 2017-11-27 ENCOUNTER — Other Ambulatory Visit: Payer: Self-pay | Admitting: Geriatric Medicine

## 2017-11-27 DIAGNOSIS — Z139 Encounter for screening, unspecified: Secondary | ICD-10-CM

## 2017-12-10 ENCOUNTER — Other Ambulatory Visit: Payer: Self-pay | Admitting: Geriatric Medicine

## 2017-12-10 DIAGNOSIS — R011 Cardiac murmur, unspecified: Secondary | ICD-10-CM

## 2017-12-11 ENCOUNTER — Ambulatory Visit (HOSPITAL_COMMUNITY): Payer: Medicare Other | Attending: Internal Medicine

## 2017-12-11 ENCOUNTER — Other Ambulatory Visit: Payer: Self-pay

## 2017-12-11 DIAGNOSIS — R011 Cardiac murmur, unspecified: Secondary | ICD-10-CM | POA: Diagnosis present

## 2018-01-11 ENCOUNTER — Ambulatory Visit
Admission: RE | Admit: 2018-01-11 | Discharge: 2018-01-11 | Disposition: A | Discharge status: Home / Self Care | Payer: Medicare Other | Source: Ambulatory Visit | Attending: Geriatric Medicine | Admitting: Geriatric Medicine

## 2018-01-11 DIAGNOSIS — Z139 Encounter for screening, unspecified: Secondary | ICD-10-CM

## 2018-09-21 ENCOUNTER — Inpatient Hospital Stay: Payer: Medicare Other | Attending: Hematology and Oncology | Admitting: Hematology and Oncology

## 2018-09-21 NOTE — Assessment & Plan Note (Deleted)
Stage IIIa ER/PR positive HER-2 negative invasive mammary cancer right breast status post few months of neoadjuvant antiestrogen therapy followed by mastectomy followed by adjuvant chemotherapy and radiation currently on anastrozole 1 mg daily since January 2015, and indeterminate liver lesion seen on initial PET CT scan was followed up and was found to be normal in April 2015.  Anastrozole toxicities: 1. Occasional hot flashes 2. Mild musculoskeletal aches and pains 3. Bone density November 2015 showed osteopenia -1.3  Breast cancer surveillance: 1. Mammogram done2/25/2019was normal the left breast, breast density category C 2. Breast and chest wall exam11/5/2019is normal.    Return to clinic in 1 year for follow-up

## 2018-10-19 ENCOUNTER — Telehealth: Payer: Self-pay | Admitting: Hematology and Oncology

## 2018-10-19 NOTE — Telephone Encounter (Signed)
Scheduled appt per 1/22 sch message - pt is aware of appt date and time   

## 2018-10-25 ENCOUNTER — Inpatient Hospital Stay: Payer: Medicare Other | Attending: Hematology and Oncology | Admitting: Hematology and Oncology

## 2018-10-25 ENCOUNTER — Telehealth: Payer: Self-pay | Admitting: Hematology and Oncology

## 2018-10-25 DIAGNOSIS — Z923 Personal history of irradiation: Secondary | ICD-10-CM | POA: Diagnosis not present

## 2018-10-25 DIAGNOSIS — Z9011 Acquired absence of right breast and nipple: Secondary | ICD-10-CM | POA: Insufficient documentation

## 2018-10-25 DIAGNOSIS — C50411 Malignant neoplasm of upper-outer quadrant of right female breast: Secondary | ICD-10-CM | POA: Insufficient documentation

## 2018-10-25 DIAGNOSIS — C50419 Malignant neoplasm of upper-outer quadrant of unspecified female breast: Secondary | ICD-10-CM

## 2018-10-25 DIAGNOSIS — Z79811 Long term (current) use of aromatase inhibitors: Secondary | ICD-10-CM | POA: Insufficient documentation

## 2018-10-25 DIAGNOSIS — Z7982 Long term (current) use of aspirin: Secondary | ICD-10-CM | POA: Diagnosis not present

## 2018-10-25 DIAGNOSIS — Z9221 Personal history of antineoplastic chemotherapy: Secondary | ICD-10-CM

## 2018-10-25 DIAGNOSIS — Z170 Estrogen receptor positive status [ER+]: Secondary | ICD-10-CM | POA: Diagnosis not present

## 2018-10-25 DIAGNOSIS — N951 Menopausal and female climacteric states: Secondary | ICD-10-CM | POA: Diagnosis not present

## 2018-10-25 DIAGNOSIS — Z79899 Other long term (current) drug therapy: Secondary | ICD-10-CM | POA: Insufficient documentation

## 2018-10-25 MED ORDER — ANASTROZOLE 1 MG PO TABS: 1.0000 mg | ORAL_TABLET | Freq: Every day | ORAL | 3 refills | Status: DC

## 2018-10-25 NOTE — Progress Notes (Signed)
Patient Care Team: Lajean Manes, MD as PCP - General (Internal Medicine) Marcy Panning, MD as Consulting Physician (Hematology and Oncology)  DIAGNOSIS:  Encounter Diagnoses  Name Primary?  . Primary cancer of upper outer quadrant of right female breast ([place])   . Malignant neoplasm of upper-outer quadrant of female breast ([place])     SUMMARY OF ONCOLOGIC HISTORY:   Primary cancer of upper outer quadrant of right female breast ([place])   08/30/2002 Surgery    Left lumpectomy and radiation showed DCIS ER/PR negative    08/04/2012 Initial Diagnosis    Right breast mass 4.7 x 3.6 x 5.2, lymph node measuring 2 cm, invasive mammary cancer ER/PR positive HER-2 negative Ki-67 66%, lymph node positive , Oncotype DX score 28, 18% ROR    09/30/2012 - 04/30/2013 Anti-estrogen oral therapy    Neoadjuvant letrozole 2.5 mg daily    04/15/2013 Surgery    Right breast mastectomy: Residual invasive ductal carcinoma grade 2, 4.3 cm, intermediate grade DCIS, angiolymphatic invasion, 5/23 lymph nodes positive, T2 N2 M0 stage IIIa ER 100% PR 0% HER-2 negative ratio 0.91    05/23/2013 - 08/30/2013 Chemotherapy    Adjuvant chemotherapy [facility]    09/22/2013 - 11/11/2013 Radiation Therapy    Adjuvant radiation therapy    11/29/2013 -  Anti-estrogen oral therapy    Arimidex 1 mg daily     CHIEF COMPLIANT: Follow-up on Arimidex therapy  INTERVAL HISTORY: Mary Young is a 77 year old with above-mentioned history of right breast cancer treated with mastectomy adjuvant chemotherapy radiation is currently on anastrozole therapy.  She has tolerated it very well.  She completed 5 years of treatment.  She used to have pain in the right chest wall but since she applied CBD pain freeze product, the pain has completely resolved.  She has had some difficulties in her left leg with loss of muscle mass.  She is worried about it.  There is no focal pain or discomfort.  She has not had any problems with gait.  REVIEW  OF SYSTEMS:   Constitutional: Denies fevers, chills or abnormal weight loss Eyes: Denies blurriness of vision Ears, nose, mouth, throat, and face: Denies mucositis or sore throat Respiratory: Denies cough, dyspnea or wheezes Cardiovascular: Denies palpitation, chest discomfort Gastrointestinal:  Denies nausea, heartburn or change in bowel habits Skin: Denies abnormal skin rashes Lymphatics: Denies new lymphadenopathy or easy bruising Neurological:Denies numbness, tingling or new weaknesses Behavioral/Psych: Mood is stable, no new changes  Extremities: No lower extremity edema Breast: Right mastectomy no palpable lumps or nodules, left breast no nodules All other systems were reviewed with the patient and are negative.  I have reviewed the past medical history, past surgical history, social history and family history with the patient and they are unchanged from previous note.  ALLERGIES:  is allergic to vicodin [hydrocodone-acetaminophen]; brimonidine tartrate-timolol; dorzolamide hcl-timolol mal; percocet [oxycodone-acetaminophen]; precipitated sulfur [sulfur]; and timolol.  MEDICATIONS:  Current Outpatient Medications  Medication Sig Dispense Refill  . anastrozole (ARIMIDEX) 1 MG tablet Take 1 tablet (1 mg total) by mouth daily. 90 tablet 3  . aspirin 81 MG tablet Take 81 mg by mouth 2 (two) times daily.    . calcium carbonate (TUMS - DOSED IN MG ELEMENTAL CALCIUM) 500 MG chewable tablet Chew 2 tablets by mouth daily.    Marland Kitchen losartan (COZAAR) 50 MG tablet Take 50 mg by mouth daily.    . Multiple Vitamin (MULTI-VITAMIN) tablet Take 1 tablet by mouth.    . pilocarpine (PILOCAR)  1 % ophthalmic solution Place 1 drop into both eyes 3 (three) times daily. Eye surgery scheduled for 09/12/14     . rosuvastatin (CRESTOR) 5 MG tablet Take 5 mg by mouth daily.    . timolol (TIMOPTIC) 0.5 % ophthalmic solution Place 1 drop into both eyes 2 (two) times daily. Non preservative    . TRAVATAN Z 0.004 %  SOLN ophthalmic solution Place 1 drop into both eyes at bedtime. Reported on 05/29/2016     No current facility-administered medications for this visit.    Facility-Administered Medications Ordered in Other Visits  Medication Dose Route Frequency Provider Last Rate Last Dose  . 0.9 %  sodium chloride infusion   Intravenous Once Causey, Charlestine Massed, NP        PHYSICAL EXAMINATION: ECOG PERFORMANCE STATUS: 1 - Symptomatic but completely ambulatory  Vitals:   10/25/18 0937  BP: 124/78  Pulse: 86  Resp: 18  Temp: 98.5 F (36.9 C)  SpO2: 98%   Filed Weights   10/25/18 0937  Weight: 128 lb 11.2 oz (58.4 kg)    GENERAL:alert, no distress and comfortable SKIN: skin color, texture, turgor are normal, no rashes or significant lesions EYES: normal, Conjunctiva are pink and non-injected, sclera clear OROPHARYNX:no exudate, no erythema and lips, buccal mucosa, and tongue normal  NECK: supple, thyroid normal size, non-tender, without nodularity LYMPH:  no palpable lymphadenopathy in the cervical, axillary or inguinal LUNGS: clear to auscultation and percussion with normal breathing effort HEART: regular rate & rhythm and no murmurs and no lower extremity edema ABDOMEN:abdomen soft, non-tender and normal bowel sounds MUSCULOSKELETAL:no cyanosis of digits and no clubbing  NEURO: alert & oriented x 3 with fluent speech, no focal motor/sensory deficits EXTREMITIES: No lower extremity edema BREAST: Right mastectomy scar benign, left breast with no palpable lumps or nodules.  (exam performed in the presence of a chaperone)  LABORATORY DATA:  I have reviewed the data as listed CMP Latest Ref Rng & Units 03/01/2015 08/31/2014 03/01/2014  Glucose 70 - 140 mg/dl 102 95 94  BUN 7.0 - 26.0 mg/dL 22.4 15.5 14.4  Creatinine 0.6 - 1.1 mg/dL 0.7 0.7 0.7  Sodium 136 - 145 mEq/L 144 143 140  Potassium 3.5 - 5.1 mEq/L 4.0 3.2(L) 4.0  Chloride 96 - 112 mEq/L - - -  CO2 22 - 29 mEq/L 31(H) 27 28    Calcium 8.4 - 10.4 mg/dL 9.2 9.7 9.5  Total Protein 6.4 - 8.3 g/dL 6.1(L) 6.8 6.7  Total Bilirubin 0.20 - 1.20 mg/dL 0.44 0.86 0.64  Alkaline Phos 40 - 150 U/L 86 93 87  AST 5 - 34 U/L '22 18 21  ' ALT 0 - 55 U/L '21 13 17    ' Lab Results  Component Value Date   WBC 4.0 03/01/2015   HGB 12.4 03/01/2015   HCT 37.1 03/01/2015   MCV 94.7 03/01/2015   PLT 214 03/01/2015   NEUTROABS 2.3 03/01/2015    ASSESSMENT & PLAN:  Primary cancer of upper outer quadrant of right female breast Stage IIIa ER/PR positive HER-2 negative invasive mammary cancer right breast status post few months of neoadjuvant antiestrogen therapy followed by mastectomy followed by adjuvant chemotherapy and radiation currently on anastrozole 1 mg daily since January 2015, and indeterminate liver lesion seen on initial PET CT scan was followed up and was found to be normal in April 2015.  Anastrozole toxicities: 1. Occasional hot flashes 2. Mild musculoskeletal aches and pains 3. Bone density November 2015 showed osteopenia -1.3  She will be getting a new bone density test. Our plan is to continue antiestrogen therapy for minimum of 7 years.  Breast cancer surveillance: 1. Mammogram done2/25/2019was normal the left breast 2. Breast and chest wall exam12/9/2019is normal.  Breast density category C  Return to clinic in 1 year for follow-up  No orders of the defined types were placed in this encounter.  The patient has a good understanding of the overall plan. she agrees with it. she will call with any problems that may develop before the next visit here.   Harriette Ohara, MD 10/25/18

## 2018-10-25 NOTE — Telephone Encounter (Signed)
Gave avs and calendar ° °

## 2018-10-25 NOTE — Assessment & Plan Note (Signed)
Stage IIIa ER/PR positive HER-2 negative invasive mammary cancer right breast status post few months of neoadjuvant antiestrogen therapy followed by mastectomy followed by adjuvant chemotherapy and radiation currently on anastrozole 1 mg daily since January 2015, and indeterminate liver lesion seen on initial PET CT scan was followed up and was found to be normal in April 2015.  Anastrozole toxicities: 1. Occasional hot flashes 2. Mild musculoskeletal aches and pains 3. Bone density November 2015 showed osteopenia -1.3  Breast cancer surveillance: 1. Mammogram done2/25/2019was normal the left breast 2. Breast and chest wall exam12/9/2019is normal.  Breast density category C  Return to clinic in 1 year for follow-up

## 2018-12-03 ENCOUNTER — Other Ambulatory Visit: Payer: Self-pay | Admitting: Geriatric Medicine

## 2018-12-03 DIAGNOSIS — Z1231 Encounter for screening mammogram for malignant neoplasm of breast: Secondary | ICD-10-CM

## 2019-01-13 ENCOUNTER — Ambulatory Visit
Admission: RE | Admit: 2019-01-13 | Discharge: 2019-01-13 | Disposition: A | Discharge status: Home / Self Care | Payer: Medicare Other | Source: Ambulatory Visit | Attending: Geriatric Medicine | Admitting: Geriatric Medicine

## 2019-01-13 DIAGNOSIS — Z1231 Encounter for screening mammogram for malignant neoplasm of breast: Secondary | ICD-10-CM

## 2019-01-28 ENCOUNTER — Other Ambulatory Visit: Payer: Self-pay | Admitting: Geriatric Medicine

## 2019-01-28 DIAGNOSIS — M8580 Other specified disorders of bone density and structure, unspecified site: Secondary | ICD-10-CM

## 2019-02-01 ENCOUNTER — Ambulatory Visit
Admission: RE | Admit: 2019-02-01 | Discharge: 2019-02-01 | Disposition: A | Discharge status: Home / Self Care | Payer: Medicare Other | Source: Ambulatory Visit | Attending: Geriatric Medicine | Admitting: Geriatric Medicine

## 2019-02-01 ENCOUNTER — Other Ambulatory Visit: Payer: Self-pay

## 2019-02-01 DIAGNOSIS — M8580 Other specified disorders of bone density and structure, unspecified site: Secondary | ICD-10-CM

## 2019-10-25 NOTE — Progress Notes (Signed)
Patient Care Team: Lajean Manes, MD as PCP - General (Internal Medicine) Marcy Panning, MD as Consulting Physician (Hematology and Oncology)  DIAGNOSIS:    ICD-10-CM   1. Primary cancer of upper outer quadrant of right female breast ([place])  C50.411     SUMMARY OF ONCOLOGIC HISTORY: Oncology History  Primary cancer of upper outer quadrant of right female breast ([place])  08/30/2002 Surgery   Left lumpectomy and radiation showed DCIS ER/PR negative   08/04/2012 Initial Diagnosis   Right breast mass 4.7 x 3.6 x 5.2, lymph node measuring 2 cm, invasive mammary cancer ER/PR positive HER-2 negative Ki-67 66%, lymph node positive , Oncotype DX score 28, 18% ROR   09/30/2012 - 04/30/2013 Anti-estrogen oral therapy   Neoadjuvant letrozole 2.5 mg daily   04/15/2013 Surgery   Right breast mastectomy: Residual invasive ductal carcinoma grade 2, 4.3 cm, intermediate grade DCIS, angiolymphatic invasion, 5/23 lymph nodes positive, T2 N2 M0 stage IIIa ER 100% PR 0% HER-2 negative ratio 0.91   05/23/2013 - 08/30/2013 Chemotherapy   Adjuvant chemotherapy [facility]   09/22/2013 - 11/11/2013 Radiation Therapy   Adjuvant radiation therapy   11/29/2013 -  Anti-estrogen oral therapy   Arimidex 1 mg daily     CHIEF COMPLIANT: Follow-up of right breast cancer on anastrozole   INTERVAL HISTORY: Mary Young is a 78 y.o. with above-mentioned history of right breast cancer treated with mastectomy, adjuvant chemotherapy, radiation, and who is currently on anastrozole therapy. Mammogram on 01/13/19 showed no evidence of malignancy in the left breast. Bone density scan on 02/01/19 showed osteopenia with a T-score of -1.8 at the AP Spine L1-L4. She presents to the clinic today for annual follow-up.   REVIEW OF SYSTEMS:   Constitutional: Denies fevers, chills or abnormal weight loss Eyes: Denies blurriness of vision Ears, nose, mouth, throat, and face: Denies mucositis or sore throat Respiratory: Denies cough,  dyspnea or wheezes Cardiovascular: Denies palpitation, chest discomfort Gastrointestinal: Denies nausea, heartburn or change in bowel habits Skin: Denies abnormal skin rashes Lymphatics: Denies new lymphadenopathy or easy bruising Neurological: Denies numbness, tingling or new weaknesses Behavioral/Psych: Mood is stable, no new changes  Extremities: No lower extremity edema Breast: denies any pain or lumps or nodules in either breasts All other systems were reviewed with the patient and are negative.  I have reviewed the past medical history, past surgical history, social history and family history with the patient and they are unchanged from previous note.  ALLERGIES:  is allergic to vicodin [hydrocodone-acetaminophen]; brimonidine tartrate-timolol; dorzolamide hcl-timolol mal; percocet [oxycodone-acetaminophen]; precipitated sulfur [sulfur]; and timolol.  MEDICATIONS:  Current Outpatient Medications  Medication Sig Dispense Refill   anastrozole (ARIMIDEX) 1 MG tablet Take 1 tablet (1 mg total) by mouth daily. 90 tablet 3   aspirin 81 MG tablet Take 81 mg by mouth 2 (two) times daily.     calcium carbonate (TUMS - DOSED IN MG ELEMENTAL CALCIUM) 500 MG chewable tablet Chew 2 tablets by mouth daily.     dorzolamide-timolol (COSOPT) 22.3-6.8 MG/ML ophthalmic solution Place 2 drops into the right eye 2 (two) times daily.     losartan (COZAAR) 50 MG tablet Take 50 mg by mouth daily.     Multiple Vitamin (MULTI-VITAMIN) tablet Take 1 tablet by mouth.     pilocarpine (PILOCAR) 1 % ophthalmic solution Place 1 drop into both eyes 3 (three) times daily. Eye surgery scheduled for 09/12/14      rosuvastatin (CRESTOR) 5 MG tablet Take 5 mg by  mouth daily.     timolol (TIMOPTIC) 0.5 % ophthalmic solution Place 1 drop into both eyes 2 (two) times daily. Non preservative     TRAVATAN Z 0.004 % SOLN ophthalmic solution Place 1 drop into the right eye at bedtime. Reported on 05/29/2016     No  current facility-administered medications for this visit.    Facility-Administered Medications Ordered in Other Visits  Medication Dose Route Frequency Provider Last Rate Last Dose   0.9 %  sodium chloride infusion   Intravenous Once Causey, Charlestine Massed, NP        PHYSICAL EXAMINATION: ECOG PERFORMANCE STATUS: 1 - Symptomatic but completely ambulatory  Vitals:   10/26/19 1019  BP: 137/73  Pulse: 83  Resp: 17  Temp: 97.7 F (36.5 C)  SpO2: 94%   Filed Weights   10/26/19 1019  Weight: 129 lb 6.4 oz (58.7 kg)    GENERAL: alert, no distress and comfortable SKIN: skin color, texture, turgor are normal, no rashes or significant lesions EYES: normal, Conjunctiva are pink and non-injected, sclera clear OROPHARYNX: no exudate, no erythema and lips, buccal mucosa, and tongue normal  NECK: supple, thyroid normal size, non-tender, without nodularity LYMPH: no palpable lymphadenopathy in the cervical, axillary or inguinal LUNGS: clear to auscultation and percussion with normal breathing effort HEART: regular rate & rhythm and no murmurs and no lower extremity edema ABDOMEN: abdomen soft, non-tender and normal bowel sounds MUSCULOSKELETAL: no cyanosis of digits and no clubbing  NEURO: alert & oriented x 3 with fluent speech, no focal motor/sensory deficits EXTREMITIES: No lower extremity edema BREAST: No palpable masses or nodules in either right or left breasts. No palpable axillary supraclavicular or infraclavicular adenopathy no breast tenderness or nipple discharge. (exam performed in the presence of a chaperone)  LABORATORY DATA:  I have reviewed the data as listed CMP Latest Ref Rng & Units 03/01/2015 08/31/2014 03/01/2014  Glucose 70 - 140 mg/dl 102 95 94  BUN 7.0 - 26.0 mg/dL 22.4 15.5 14.4  Creatinine 0.6 - 1.1 mg/dL 0.7 0.7 0.7  Sodium 136 - 145 mEq/L 144 143 140  Potassium 3.5 - 5.1 mEq/L 4.0 3.2(L) 4.0  Chloride 96 - 112 mEq/L - - -  CO2 22 - 29 mEq/L 31(H) 27 28    Calcium 8.4 - 10.4 mg/dL 9.2 9.7 9.5  Total Protein 6.4 - 8.3 g/dL 6.1(L) 6.8 6.7  Total Bilirubin 0.20 - 1.20 mg/dL 0.44 0.86 0.64  Alkaline Phos 40 - 150 U/L 86 93 87  AST 5 - 34 U/L _0 ALT 0 - 55 U/L _1 Lab Results  Component Value Date   WBC 4.0 03/01/2015   HGB 12.4 03/01/2015   HCT 37.1 03/01/2015   MCV 94.7 03/01/2015   PLT 214 03/01/2015   NEUTROABS 2.3 03/01/2015    ASSESSMENT & PLAN:  Primary cancer of upper outer quadrant of right female breast Stage IIIa ER/PR positive HER-2 negative invasive mammary cancer right breast status post few months of neoadjuvant antiestrogen therapy followed by mastectomy followed by adjuvant chemotherapy and radiation currently on anastrozole 1 mg daily since January 2015, and indeterminate liver lesion seen on initial PET CT scan was followed up and was found to be normal in April 2015.  Anastrozole toxicities: 1. Occasional hot flashes 2. Mild musculoskeletal aches and pains 3. Bone density 02/01/2019 showed osteopenia -1.8: Encouraged weightbearing exercise along with calcium and vitamin D.  Our plan is to continue antiestrogen therapy for 7  years. This will be completed next year.  Breast cancer surveillance: 1. Mammogram done2/27/2020was normal the left breast 2. Breast and chest wall exam12/9/2020is normal. Breast density category C  Return to clinic in 1 year for follow-up    No orders of the defined types were placed in this encounter.  The patient has a good understanding of the overall plan. she agrees with it. she will call with any problems that may develop before the next visit here.  Nicholas Lose, MD 10/26/2019  Julious Oka Dorshimer, am acting as scribe for Dr. Nicholas Lose.  I have reviewed the above documentation for accuracy and completeness, and I agree with the above.

## 2019-10-26 ENCOUNTER — Inpatient Hospital Stay: Payer: Medicare Other | Attending: Hematology and Oncology | Admitting: Hematology and Oncology

## 2019-10-26 ENCOUNTER — Other Ambulatory Visit: Payer: Self-pay

## 2019-10-26 DIAGNOSIS — M8580 Other specified disorders of bone density and structure, unspecified site: Secondary | ICD-10-CM | POA: Insufficient documentation

## 2019-10-26 DIAGNOSIS — C50411 Malignant neoplasm of upper-outer quadrant of right female breast: Secondary | ICD-10-CM

## 2019-10-26 DIAGNOSIS — Z79811 Long term (current) use of aromatase inhibitors: Secondary | ICD-10-CM | POA: Diagnosis not present

## 2019-10-26 DIAGNOSIS — C50419 Malignant neoplasm of upper-outer quadrant of unspecified female breast: Secondary | ICD-10-CM

## 2019-10-26 DIAGNOSIS — R232 Flushing: Secondary | ICD-10-CM | POA: Insufficient documentation

## 2019-10-26 DIAGNOSIS — Z79899 Other long term (current) drug therapy: Secondary | ICD-10-CM | POA: Diagnosis not present

## 2019-10-26 DIAGNOSIS — Z7982 Long term (current) use of aspirin: Secondary | ICD-10-CM | POA: Insufficient documentation

## 2019-10-26 DIAGNOSIS — N951 Menopausal and female climacteric states: Secondary | ICD-10-CM | POA: Insufficient documentation

## 2019-10-26 DIAGNOSIS — Z170 Estrogen receptor positive status [ER+]: Secondary | ICD-10-CM | POA: Diagnosis not present

## 2019-10-26 MED ORDER — VITAMIN D 25 MCG (1000 UNIT) PO TABS: 1000.0000 [IU] | ORAL_TABLET | Freq: Every day | ORAL | Status: AC

## 2019-10-26 MED ORDER — VITAMIN B-12 1000 MCG PO TABS: 1000.0000 ug | ORAL_TABLET | Freq: Every day | ORAL | Status: AC

## 2019-10-26 MED ORDER — ANASTROZOLE 1 MG PO TABS: 1.0000 mg | ORAL_TABLET | Freq: Every day | ORAL | 3 refills | Status: AC

## 2019-10-26 NOTE — Assessment & Plan Note (Signed)
Stage IIIa ER/PR positive HER-2 negative invasive mammary cancer right breast status post few months of neoadjuvant antiestrogen therapy followed by mastectomy followed by adjuvant chemotherapy and radiation currently on anastrozole 1 mg daily since January 2015, and indeterminate liver lesion seen on initial PET CT scan was followed up and was found to be normal in April 2015.  Anastrozole toxicities: 1. Occasional hot flashes 2. Mild musculoskeletal aches and pains 3. Bone density 02/01/2019 showed osteopenia -1.8  Our plan is to continue antiestrogen therapy for minimum of 7 years.  Breast cancer surveillance: 1. Mammogram done2/27/2020was normal the left breast 2. Breast and chest wall exam12/9/2020is normal. Breast density category C  Return to clinic in 1 year for follow-up

## 2019-10-27 ENCOUNTER — Telehealth: Payer: Self-pay | Admitting: Hematology and Oncology

## 2019-10-27 NOTE — Telephone Encounter (Signed)
I talk with patient regarding schedule  

## 2019-11-30 ENCOUNTER — Other Ambulatory Visit: Payer: Self-pay | Admitting: Geriatric Medicine

## 2019-11-30 DIAGNOSIS — Z1231 Encounter for screening mammogram for malignant neoplasm of breast: Secondary | ICD-10-CM

## 2019-12-27 ENCOUNTER — Encounter: Payer: Self-pay | Admitting: Hematology and Oncology

## 2020-01-16 ENCOUNTER — Other Ambulatory Visit: Payer: Self-pay | Admitting: Geriatric Medicine

## 2020-01-16 ENCOUNTER — Ambulatory Visit
Admission: RE | Admit: 2020-01-16 | Discharge: 2020-01-16 | Disposition: A | Discharge status: Home / Self Care | Payer: Medicare Other | Source: Ambulatory Visit | Attending: Geriatric Medicine | Admitting: Geriatric Medicine

## 2020-01-16 ENCOUNTER — Other Ambulatory Visit: Payer: Self-pay

## 2020-01-16 DIAGNOSIS — Z1231 Encounter for screening mammogram for malignant neoplasm of breast: Secondary | ICD-10-CM

## 2020-05-09 ENCOUNTER — Ambulatory Visit
Admission: RE | Admit: 2020-05-09 | Discharge: 2020-05-09 | Disposition: A | Discharge status: Home / Self Care | Payer: Medicare Other | Source: Ambulatory Visit | Attending: Geriatric Medicine | Admitting: Geriatric Medicine

## 2020-05-09 ENCOUNTER — Other Ambulatory Visit: Payer: Self-pay | Admitting: Geriatric Medicine

## 2020-05-09 DIAGNOSIS — M546 Pain in thoracic spine: Secondary | ICD-10-CM

## 2020-05-09 DIAGNOSIS — R059 Cough, unspecified: Secondary | ICD-10-CM

## 2020-05-17 ENCOUNTER — Other Ambulatory Visit: Payer: Self-pay | Admitting: Geriatric Medicine

## 2020-05-17 ENCOUNTER — Ambulatory Visit
Admission: RE | Admit: 2020-05-17 | Discharge: 2020-05-17 | Disposition: A | Discharge status: Home / Self Care | Payer: Medicare Other | Source: Ambulatory Visit | Attending: Geriatric Medicine | Admitting: Geriatric Medicine

## 2020-05-17 DIAGNOSIS — R9389 Abnormal findings on diagnostic imaging of other specified body structures: Secondary | ICD-10-CM

## 2020-07-18 ENCOUNTER — Other Ambulatory Visit: Payer: Self-pay | Admitting: Geriatric Medicine

## 2020-07-18 ENCOUNTER — Ambulatory Visit
Admission: RE | Admit: 2020-07-18 | Discharge: 2020-07-18 | Disposition: A | Discharge status: Home / Self Care | Payer: Medicare Other | Source: Ambulatory Visit | Attending: Geriatric Medicine | Admitting: Geriatric Medicine

## 2020-07-18 DIAGNOSIS — R9389 Abnormal findings on diagnostic imaging of other specified body structures: Secondary | ICD-10-CM

## 2020-10-10 ENCOUNTER — Telehealth: Payer: Self-pay | Admitting: Hematology and Oncology

## 2020-10-10 NOTE — Telephone Encounter (Signed)
Rescheduled per provider. Called and spoke with pt, confirmed 12/16 appt

## 2020-10-25 ENCOUNTER — Ambulatory Visit: Payer: Medicare Other | Admitting: Hematology and Oncology

## 2020-11-01 ENCOUNTER — Ambulatory Visit: Payer: Medicare Other | Admitting: Hematology and Oncology

## 2020-12-11 ENCOUNTER — Ambulatory Visit
Admission: RE | Admit: 2020-12-11 | Discharge: 2020-12-11 | Disposition: A | Discharge status: Home / Self Care | Payer: Medicare Other | Source: Ambulatory Visit | Attending: Geriatric Medicine | Admitting: Geriatric Medicine

## 2020-12-11 ENCOUNTER — Other Ambulatory Visit: Payer: Self-pay | Admitting: Geriatric Medicine

## 2020-12-11 DIAGNOSIS — H04123 Dry eye syndrome of bilateral lacrimal glands: Secondary | ICD-10-CM | POA: Diagnosis not present

## 2020-12-11 DIAGNOSIS — R9389 Abnormal findings on diagnostic imaging of other specified body structures: Secondary | ICD-10-CM

## 2020-12-11 DIAGNOSIS — R918 Other nonspecific abnormal finding of lung field: Secondary | ICD-10-CM | POA: Diagnosis not present

## 2020-12-11 DIAGNOSIS — J984 Other disorders of lung: Secondary | ICD-10-CM | POA: Diagnosis not present

## 2020-12-12 DIAGNOSIS — I10 Essential (primary) hypertension: Secondary | ICD-10-CM | POA: Diagnosis not present

## 2020-12-12 DIAGNOSIS — E7800 Pure hypercholesterolemia, unspecified: Secondary | ICD-10-CM | POA: Diagnosis not present

## 2020-12-12 DIAGNOSIS — C50919 Malignant neoplasm of unspecified site of unspecified female breast: Secondary | ICD-10-CM | POA: Diagnosis not present

## 2020-12-17 ENCOUNTER — Other Ambulatory Visit: Payer: Self-pay | Admitting: Geriatric Medicine

## 2020-12-17 DIAGNOSIS — Z1231 Encounter for screening mammogram for malignant neoplasm of breast: Secondary | ICD-10-CM

## 2021-01-11 DIAGNOSIS — H401133 Primary open-angle glaucoma, bilateral, severe stage: Secondary | ICD-10-CM | POA: Diagnosis not present

## 2021-01-20 ENCOUNTER — Other Ambulatory Visit: Payer: Self-pay

## 2021-01-20 ENCOUNTER — Emergency Department (HOSPITAL_BASED_OUTPATIENT_CLINIC_OR_DEPARTMENT_OTHER)
Admission: EM | Admit: 2021-01-20 | Discharge: 2021-01-20 | Disposition: A | Discharge status: Home / Self Care | Payer: Medicare Other | Attending: Emergency Medicine | Admitting: Emergency Medicine

## 2021-01-20 ENCOUNTER — Encounter (HOSPITAL_BASED_OUTPATIENT_CLINIC_OR_DEPARTMENT_OTHER): Payer: Self-pay | Admitting: Emergency Medicine

## 2021-01-20 ENCOUNTER — Emergency Department (HOSPITAL_BASED_OUTPATIENT_CLINIC_OR_DEPARTMENT_OTHER): Payer: Medicare Other

## 2021-01-20 DIAGNOSIS — Z853 Personal history of malignant neoplasm of breast: Secondary | ICD-10-CM | POA: Diagnosis not present

## 2021-01-20 DIAGNOSIS — Z79899 Other long term (current) drug therapy: Secondary | ICD-10-CM | POA: Diagnosis not present

## 2021-01-20 DIAGNOSIS — K219 Gastro-esophageal reflux disease without esophagitis: Secondary | ICD-10-CM | POA: Insufficient documentation

## 2021-01-20 DIAGNOSIS — I10 Essential (primary) hypertension: Secondary | ICD-10-CM | POA: Diagnosis not present

## 2021-01-20 DIAGNOSIS — R0789 Other chest pain: Secondary | ICD-10-CM | POA: Diagnosis not present

## 2021-01-20 DIAGNOSIS — R079 Chest pain, unspecified: Secondary | ICD-10-CM | POA: Diagnosis not present

## 2021-01-20 DIAGNOSIS — Z8579 Personal history of other malignant neoplasms of lymphoid, hematopoietic and related tissues: Secondary | ICD-10-CM | POA: Insufficient documentation

## 2021-01-20 LAB — CBC
HCT: 41.3 % (ref 36.0–46.0)
Hemoglobin: 13.8 g/dL (ref 12.0–15.0)
MCH: 32.2 pg (ref 26.0–34.0)
MCHC: 33.4 g/dL (ref 30.0–36.0)
MCV: 96.3 fL (ref 80.0–100.0)
Platelets: 207 10*3/uL (ref 150–400)
RBC: 4.29 MIL/uL (ref 3.87–5.11)
RDW: 11.6 % (ref 11.5–15.5)
WBC: 4.9 10*3/uL (ref 4.0–10.5)
nRBC: 0 % (ref 0.0–0.2)

## 2021-01-20 LAB — BASIC METABOLIC PANEL
Anion gap: 9 (ref 5–15)
BUN: 18 mg/dL (ref 8–23)
CO2: 28 mmol/L (ref 22–32)
Calcium: 9.4 mg/dL (ref 8.9–10.3)
Chloride: 103 mmol/L (ref 98–111)
Creatinine, Ser: 0.65 mg/dL (ref 0.44–1.00)
GFR, Estimated: >60 mL/min (ref >60)
Glucose, Bld: 88 mg/dL (ref 70–99)
Potassium: 4 mmol/L (ref 3.5–5.1)
Sodium: 140 mmol/L (ref 135–145)

## 2021-01-20 LAB — TROPONIN I (HIGH SENSITIVITY): Troponin I (High Sensitivity): 3 ng/L (ref <18)

## 2021-01-20 NOTE — ED Provider Notes (Signed)
[facility] EMERGENCY DEPARTMENT Provider Note   CSN: [id number] Arrival date & time: 01/20/21  1109     History Chief Complaint  Patient presents with  . Chest Pain    Mary Young is a 80 y.o. female presenting for evaluation of chest pain.  Patient states 2 days ago she was preparing to have company at her house when she suddenly felt chest tightness/pressure.  It then radiated to her back as a dullness.  Symptoms gradually resolved, she did take aspirin, but not take anything else.  She is currently symptom-free.  No associated fevers, chills, cough, shortness of breath, nausea, vomiting, diaphoresis, abdominal pain, urinary symptoms, normal bowel movements.  No leg pain or swelling.  She denies tobacco use.  Does have a history of hypertension and hyperlipidemia, no history of diabetes.  She has followed up with cardiology, but it has been several years.  She was found to have a murmur, no other significant cardiac problems.  She did take a long car trip to [place] 2 months ago, but otherwise no recent travel.  No recent surgeries, immobilization, history of previous PE, or hormone use.  She does have a remote history of breast cancer, not currently on immunosuppression.  Additional history obtained from chart review.  History of breast cancer in 2003, GERD, heart murmur, hyperlipidemia, hypertension  HPI     Past Medical History:  Diagnosis Date  . Allergy   . Arthritis   . Breast cancer ([place])    left lumpectomy 2003  . Cataract    left eye  . Central retinal vein occlusion   . Dyslipidemia   . GERD (gastroesophageal reflux disease)   . Glaucoma   . H/O echocardiogram 09/27/2009   Note to have some aortic valve sclerosis, mild mitral valve prolapse, mild mitral regurgitation, and mild asymptomatic LVH with an EF>55%  . Heart murmur   . History of stress test 09/27/2009   Normal Myocardial perfusion study. No significant ischemia demonstrated, this low  risk scan. compared to the previous study.,there is no significant change, Normal myocardial Perfusion imaging. EF>83%  . Hyperlipidemia   . Hypertension   . Joint pain   . Osteoporosis   . Seasonal allergies   . Stroke [facility])    central retinal occusion - right eye    Patient Active Problem List   Diagnosis Date Noted  . Mild mitral valve prolapse 06/20/2014  . Palpitations 06/20/2014  . Essential hypertension 06/20/2014  . Breast cancer metastasized to axillary lymph node ([place]) 12/27/2012  . History of breast cancer 09/16/2012  . Primary cancer of upper outer quadrant of right female breast ([place]) 09/03/2012  . HYPERLIPIDEMIA 07/10/2007  . HEART MURMUR, HX OF 07/10/2007  . ARTHROSCOPY, KNEE, HX OF 07/10/2007    Past Surgical History:  Procedure Laterality Date  . ABDOMINAL HYSTERECTOMY  1991  . ARTHROSCOPY KNEE W/ DRILLING  2005/2008   Left knee/Right knee  . BREAST LUMPECTOMY  2003  . EXCISION / BIOPSY BREAST / NIPPLE / DUCT Left   . MASTECTOMY Right 2014  . MASTECTOMY MODIFIED RADICAL Right 04/15/2013   Procedure: MASTECTOMY MODIFIED RADICAL;  Surgeon: Adin [name], MD;  Location: [facility];  Service: General;  Laterality: Right;  . PORTACATH PLACEMENT Left 05/13/2013   Procedure: INSERTION PORT-A-CATH;  Surgeon: Adin [name], MD;  Location: [facility];  Service: General;  Laterality: Left;  . TUBAL LIGATION  1983     OB History   No obstetric history on  file.    Obstetric Comments  Menses age 62 First full term birth 63 HRT 7 or 8 years.        Family History  Problem Relation Age of Onset  . Cancer Brother        tumor near liver pancreas   half brother  . Colon cancer Neg Hx   . Esophageal cancer Neg Hx   . Pancreatic cancer Neg Hx   . Rectal cancer Neg Hx   . Stomach cancer Neg Hx     Social History   Tobacco Use  . Smoking status: Never Smoker  . Smokeless tobacco: Never Used  Vaping Use  . Vaping Use: Never used  Substance Use Topics  . Alcohol  use: Yes    Comment: occasional glass of wine  . Drug use: No    Home Medications Prior to Admission medications   Medication Sig Start Date End Date Taking? Authorizing Provider  anastrozole (ARIMIDEX) 1 MG tablet Take 1 tablet (1 mg total) by mouth daily. 10/26/19   Nicholas Lose, MD  calcium carbonate (TUMS - DOSED IN MG ELEMENTAL CALCIUM) 500 MG chewable tablet Chew 2 tablets by mouth daily.    [provider]  cholecalciferol (VITAMIN D3) 25 MCG (1000 UT) tablet Take 1 tablet (1,000 Units total) by mouth daily. 10/26/19   Nicholas Lose, MD  dorzolamide-timolol (COSOPT) 22.3-6.8 MG/ML ophthalmic solution Place 2 drops into the right eye 2 (two) times daily.    [provider]  losartan (COZAAR) 50 MG tablet Take 50 mg by mouth daily.    [provider]  Multiple Vitamin (MULTI-VITAMIN) tablet Take 1 tablet by mouth.    [provider]  rosuvastatin (CRESTOR) 5 MG tablet Take 5 mg by mouth daily.    [provider]  TRAVATAN Z 0.004 % SOLN ophthalmic solution Place 1 drop into the right eye at bedtime. Reported on 05/29/2016 08/19/12   [provider]  vitamin B-12 (CYANOCOBALAMIN) 1000 MCG tablet Take 1 tablet (1,000 mcg total) by mouth daily. 10/26/19   Nicholas Lose, MD    Allergies    Vicodin [hydrocodone-acetaminophen], Brimonidine tartrate-timolol, Dorzolamide hcl-timolol mal, Percocet [oxycodone-acetaminophen], Precipitated sulfur [elemental sulfur], and Timolol  Review of Systems   Review of Systems  Cardiovascular: Positive for chest pain (resolved).  All other systems reviewed and are negative.   Physical Exam Updated Vital Signs BP (!) 161/71 (BP Location: Left Arm)   Pulse 78   Temp 98.3 F (36.8 C) (Oral)   Resp 16   Ht 5\' 4"  (1.626 m)   Wt 57.2 kg   SpO2 98%   BMI 21.63 kg/m   Physical Exam Vitals and nursing note reviewed.  Constitutional:      General: She is not in acute distress.    Appearance: She is  well-developed and well-nourished.     Comments: Resting comfortably in the bed in no acute distress  HENT:     Head: Normocephalic and atraumatic.  Eyes:     Extraocular Movements: EOM normal.     Conjunctiva/sclera: Conjunctivae normal.     Pupils: Pupils are equal, round, and reactive to light.  Cardiovascular:     Rate and Rhythm: Normal rate and regular rhythm.     Pulses: Normal pulses and intact distal pulses.  Pulmonary:     Effort: Pulmonary effort is normal. No respiratory distress.     Breath sounds: Normal breath sounds. No wheezing.     Comments: Clear lung sounds in  all fields.  No significant TTP of the chest wall Abdominal:     General: There is no distension.     Palpations: Abdomen is soft. There is no mass.     Tenderness: There is no abdominal tenderness. There is no guarding or rebound.  Musculoskeletal:        General: Normal range of motion.     Cervical back: Normal range of motion and neck supple.     Right lower leg: No edema.     Left lower leg: No edema.  Skin:    General: Skin is warm and dry.     Capillary Refill: Capillary refill takes less than 2 seconds.  Neurological:     Mental Status: She is alert and oriented to person, place, and time.  Psychiatric:        Mood and Affect: Mood and affect normal.     ED Results / Procedures / Treatments   Labs (all labs ordered are listed, but only abnormal results are displayed) Labs Reviewed  BASIC METABOLIC PANEL  CBC  TROPONIN I (HIGH SENSITIVITY)    EKG None  Radiology DG Chest 2 View  Result Date: 01/20/2021 CLINICAL DATA:  LEFT upper chest pain EXAM: CHEST - 2 VIEW COMPARISON:  December 11, 2020 FINDINGS: The cardiomediastinal silhouette is unchanged in contour.RIGHT axillary surgical clips. No pleural effusion. No pneumothorax. Unchanged RIGHT apical linear nodule versus scar. No acute pleuroparenchymal abnormality. Visualized abdomen is unremarkable. Mild degenerative changes of the  thoracic spine. IMPRESSION: No acute cardiopulmonary abnormality. Electronically Signed   By: Valentino Saxon MD   On: 01/20/2021 11:52    Procedures Procedures   Medications Ordered in ED Medications - No data to display  ED Course  I have reviewed the triage vital signs and the nursing notes.  Pertinent labs & imaging results that were available during my care of the patient were reviewed by me and considered in my medical decision making (see chart for details).    MDM Rules/Calculators/A&P                          Patient presenting for evaluation of chest pain.  This occurred several days ago, she has been symptom-free since.  On exam, patient appears nontoxic.  She does have some risk factors for ACS and that she has a history of hypertension and hyperlipidemia.  However initial round of cardiac evaluation including troponin and EKG are overall reassuring.  Labs interpreted by me, no anemia, leukocytosis, or significant electrolyte abnormality.  Chest x-ray viewed interpreted by me, no pneumonia, pneumothorax, effusion.  Patient also has some risk factors for PE, and that she has a history of breast cancer and did travel several months ago.  However she has no clinical signs of a DVT, vital signs are stable, and she is currently symptom-free.  I discussed with patient plan to get a repeat troponin and a D-dimer to ensure no concerning findings.  Patient states that as she feels well at this time, and can follow closely with her PCP, she had prefer to have remaining work-up done with her PCP.  I discussed prompt return to the ER with any concerning symptoms including chest pain, shortness of breath, clamminess/diaphoresis.  As she appears stable at this time, I am agreeable to d/c. Discussed with attending, Dr. Darl Householder evaluated the pt. Patient states she understands and agrees to plan.  Final Clinical Impression(s) / ED Diagnoses Final diagnoses:  Atypical  chest pain    Rx / DC  Orders ED Discharge Orders    None       Franchot Heidelberg, PA-C 01/20/21 1517    Drenda Freeze, MD 01/20/21 2300

## 2021-01-20 NOTE — ED Notes (Signed)
Denies pain at present; sts had company over the weekend and didn't want to ruin the weekend by coming to the ED

## 2021-01-20 NOTE — ED Triage Notes (Signed)
Pt c/o left sided chest pain onset Friday night. Pt reports nausea with symptoms.

## 2021-01-20 NOTE — Discharge Instructions (Addendum)
It is important that you follow-up with your primary care doctor tomorrow to discuss your symptoms and work-up that you have had.  In the ER, your initial troponin was negative, and your EKG and x-ray were overall reassuring. We discussed options of obtaining testing for PE, which you declined, but may be worth talking to your primary care doctor about. Return to the emergency room immediately if you develop chest pain, chest tightness/pressure/heaviness, shortness of breath, or any new, worsening, or concerning symptoms

## 2021-01-21 DIAGNOSIS — R0789 Other chest pain: Secondary | ICD-10-CM | POA: Diagnosis not present

## 2021-01-21 DIAGNOSIS — M940 Chondrocostal junction syndrome [Tietze]: Secondary | ICD-10-CM | POA: Diagnosis not present

## 2021-01-28 DIAGNOSIS — C50919 Malignant neoplasm of unspecified site of unspecified female breast: Secondary | ICD-10-CM | POA: Diagnosis not present

## 2021-01-28 DIAGNOSIS — I10 Essential (primary) hypertension: Secondary | ICD-10-CM | POA: Diagnosis not present

## 2021-01-28 DIAGNOSIS — E7800 Pure hypercholesterolemia, unspecified: Secondary | ICD-10-CM | POA: Diagnosis not present

## 2021-01-29 ENCOUNTER — Inpatient Hospital Stay: Admission: RE | Admit: 2021-01-29 | Payer: Medicare Other | Source: Ambulatory Visit

## 2021-02-11 DIAGNOSIS — I10 Essential (primary) hypertension: Secondary | ICD-10-CM | POA: Diagnosis not present

## 2021-02-11 DIAGNOSIS — M79672 Pain in left foot: Secondary | ICD-10-CM | POA: Diagnosis not present

## 2021-02-11 DIAGNOSIS — Z79899 Other long term (current) drug therapy: Secondary | ICD-10-CM | POA: Diagnosis not present

## 2021-02-14 DIAGNOSIS — M19072 Primary osteoarthritis, left ankle and foot: Secondary | ICD-10-CM | POA: Diagnosis not present

## 2021-02-22 DIAGNOSIS — C50911 Malignant neoplasm of unspecified site of right female breast: Secondary | ICD-10-CM | POA: Diagnosis not present

## 2021-03-07 DIAGNOSIS — C50919 Malignant neoplasm of unspecified site of unspecified female breast: Secondary | ICD-10-CM | POA: Diagnosis not present

## 2021-03-07 DIAGNOSIS — I10 Essential (primary) hypertension: Secondary | ICD-10-CM | POA: Diagnosis not present

## 2021-03-07 DIAGNOSIS — E7800 Pure hypercholesterolemia, unspecified: Secondary | ICD-10-CM | POA: Diagnosis not present

## 2021-03-21 ENCOUNTER — Ambulatory Visit
Admission: RE | Admit: 2021-03-21 | Discharge: 2021-03-21 | Disposition: A | Discharge status: Home / Self Care | Payer: Medicare Other | Source: Ambulatory Visit | Attending: Geriatric Medicine | Admitting: Geriatric Medicine

## 2021-03-21 ENCOUNTER — Other Ambulatory Visit: Payer: Self-pay

## 2021-03-21 DIAGNOSIS — Z1231 Encounter for screening mammogram for malignant neoplasm of breast: Secondary | ICD-10-CM

## 2021-03-26 DIAGNOSIS — H10413 Chronic giant papillary conjunctivitis, bilateral: Secondary | ICD-10-CM | POA: Diagnosis not present

## 2021-03-27 DIAGNOSIS — C50911 Malignant neoplasm of unspecified site of right female breast: Secondary | ICD-10-CM | POA: Diagnosis not present

## 2021-04-09 DIAGNOSIS — H10413 Chronic giant papillary conjunctivitis, bilateral: Secondary | ICD-10-CM | POA: Diagnosis not present

## 2021-04-10 DIAGNOSIS — C50911 Malignant neoplasm of unspecified site of right female breast: Secondary | ICD-10-CM | POA: Diagnosis not present

## 2021-04-24 DIAGNOSIS — I10 Essential (primary) hypertension: Secondary | ICD-10-CM | POA: Diagnosis not present

## 2021-04-24 DIAGNOSIS — E7800 Pure hypercholesterolemia, unspecified: Secondary | ICD-10-CM | POA: Diagnosis not present

## 2021-04-30 DIAGNOSIS — H401133 Primary open-angle glaucoma, bilateral, severe stage: Secondary | ICD-10-CM | POA: Diagnosis not present

## 2021-04-30 DIAGNOSIS — H10413 Chronic giant papillary conjunctivitis, bilateral: Secondary | ICD-10-CM | POA: Diagnosis not present

## 2021-06-24 DIAGNOSIS — H04123 Dry eye syndrome of bilateral lacrimal glands: Secondary | ICD-10-CM | POA: Diagnosis not present

## 2021-06-24 DIAGNOSIS — H0100B Unspecified blepharitis left eye, upper and lower eyelids: Secondary | ICD-10-CM | POA: Diagnosis not present

## 2021-06-24 DIAGNOSIS — H0100A Unspecified blepharitis right eye, upper and lower eyelids: Secondary | ICD-10-CM | POA: Diagnosis not present

## 2021-07-08 DIAGNOSIS — M71371 Other bursal cyst, right ankle and foot: Secondary | ICD-10-CM | POA: Diagnosis not present

## 2021-07-08 DIAGNOSIS — L820 Inflamed seborrheic keratosis: Secondary | ICD-10-CM | POA: Diagnosis not present

## 2021-07-08 DIAGNOSIS — L570 Actinic keratosis: Secondary | ICD-10-CM | POA: Diagnosis not present

## 2021-07-08 DIAGNOSIS — L814 Other melanin hyperpigmentation: Secondary | ICD-10-CM | POA: Diagnosis not present

## 2021-07-08 DIAGNOSIS — M7130 Other bursal cyst, unspecified site: Secondary | ICD-10-CM | POA: Diagnosis not present

## 2021-07-08 DIAGNOSIS — Z85828 Personal history of other malignant neoplasm of skin: Secondary | ICD-10-CM | POA: Diagnosis not present

## 2021-07-08 DIAGNOSIS — L821 Other seborrheic keratosis: Secondary | ICD-10-CM | POA: Diagnosis not present

## 2021-07-11 DIAGNOSIS — H35371 Puckering of macula, right eye: Secondary | ICD-10-CM | POA: Diagnosis not present

## 2021-07-26 ENCOUNTER — Other Ambulatory Visit: Payer: Self-pay | Admitting: Internal Medicine

## 2021-07-26 ENCOUNTER — Ambulatory Visit
Admission: RE | Admit: 2021-07-26 | Discharge: 2021-07-26 | Disposition: A | Discharge status: Home / Self Care | Payer: Medicare Other | Source: Ambulatory Visit | Attending: Internal Medicine | Admitting: Internal Medicine

## 2021-07-26 DIAGNOSIS — R079 Chest pain, unspecified: Secondary | ICD-10-CM | POA: Diagnosis not present

## 2021-07-26 DIAGNOSIS — M546 Pain in thoracic spine: Secondary | ICD-10-CM | POA: Diagnosis not present

## 2021-07-26 DIAGNOSIS — R053 Chronic cough: Secondary | ICD-10-CM

## 2021-08-01 ENCOUNTER — Other Ambulatory Visit: Payer: Self-pay | Admitting: Geriatric Medicine

## 2021-08-01 DIAGNOSIS — R9389 Abnormal findings on diagnostic imaging of other specified body structures: Secondary | ICD-10-CM

## 2021-08-05 DIAGNOSIS — H35371 Puckering of macula, right eye: Secondary | ICD-10-CM | POA: Diagnosis not present

## 2021-08-05 DIAGNOSIS — H35033 Hypertensive retinopathy, bilateral: Secondary | ICD-10-CM | POA: Diagnosis not present

## 2021-08-05 DIAGNOSIS — H35352 Cystoid macular degeneration, left eye: Secondary | ICD-10-CM | POA: Diagnosis not present

## 2021-08-05 DIAGNOSIS — H43813 Vitreous degeneration, bilateral: Secondary | ICD-10-CM | POA: Diagnosis not present

## 2021-08-13 DIAGNOSIS — H401133 Primary open-angle glaucoma, bilateral, severe stage: Secondary | ICD-10-CM | POA: Diagnosis not present

## 2021-08-14 DIAGNOSIS — I10 Essential (primary) hypertension: Secondary | ICD-10-CM | POA: Diagnosis not present

## 2021-08-14 DIAGNOSIS — R052 Subacute cough: Secondary | ICD-10-CM | POA: Diagnosis not present

## 2021-08-14 DIAGNOSIS — Z79899 Other long term (current) drug therapy: Secondary | ICD-10-CM | POA: Diagnosis not present

## 2021-08-14 DIAGNOSIS — E7800 Pure hypercholesterolemia, unspecified: Secondary | ICD-10-CM | POA: Diagnosis not present

## 2021-08-14 DIAGNOSIS — Z1389 Encounter for screening for other disorder: Secondary | ICD-10-CM | POA: Diagnosis not present

## 2021-08-14 DIAGNOSIS — R9389 Abnormal findings on diagnostic imaging of other specified body structures: Secondary | ICD-10-CM | POA: Diagnosis not present

## 2021-08-14 DIAGNOSIS — M3500 Sicca syndrome, unspecified: Secondary | ICD-10-CM | POA: Diagnosis not present

## 2021-08-14 DIAGNOSIS — Z0000 Encounter for general adult medical examination without abnormal findings: Secondary | ICD-10-CM | POA: Diagnosis not present

## 2021-08-16 ENCOUNTER — Other Ambulatory Visit: Payer: Self-pay

## 2021-08-16 ENCOUNTER — Ambulatory Visit
Admission: RE | Admit: 2021-08-16 | Discharge: 2021-08-16 | Disposition: A | Discharge status: Home / Self Care | Payer: Medicare Other | Source: Ambulatory Visit | Attending: Geriatric Medicine | Admitting: Geriatric Medicine

## 2021-08-16 DIAGNOSIS — I700 Atherosclerosis of aorta: Secondary | ICD-10-CM | POA: Diagnosis not present

## 2021-08-16 DIAGNOSIS — R9389 Abnormal findings on diagnostic imaging of other specified body structures: Secondary | ICD-10-CM

## 2021-08-26 DIAGNOSIS — H35033 Hypertensive retinopathy, bilateral: Secondary | ICD-10-CM | POA: Diagnosis not present

## 2021-08-26 DIAGNOSIS — H43813 Vitreous degeneration, bilateral: Secondary | ICD-10-CM | POA: Diagnosis not present

## 2021-08-26 DIAGNOSIS — H35371 Puckering of macula, right eye: Secondary | ICD-10-CM | POA: Diagnosis not present

## 2021-08-26 DIAGNOSIS — H35352 Cystoid macular degeneration, left eye: Secondary | ICD-10-CM | POA: Diagnosis not present

## 2021-09-04 NOTE — Progress Notes (Signed)
Synopsis: Referred for abnormal CT Chest by Lajean Manes, MD  Subjective:   PATIENT ID: Mary Young GENDER: female DOB: 1941-01-18, MRN: [id number]  Chief Complaint  Patient presents with   Consult    Here for abnormal CT   80yF with history of L breast cancer s/p lumpectomy in 2003 and XRT, right breast cancer s/p mastectomy, chemoXRT iin 2014, GERD, allergy who is referred for bronchiectasis and tree-in-bud nodules on CT Chest 08/16/21  She says that she has a cough that she really doesn't think is bothersome. She'll try cough drops to manage. It is not productive. She says she does intermittently get 'pains around my heart.' It happens at rest typically but not worse with exertion. It is not pleuritic. It is not positional. She has no shortness of breath with exertion.  Otherwise pertinent review of systems is negative.  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 No significant exposures to dusts/solvents/particulates wtihout mask except for use of household cleaners. No water damage to home. No pets at home.   Past Medical History:  Diagnosis Date   Allergy    Arthritis    Breast cancer ([place])    left lumpectomy 2003   Cataract    left eye   Central retinal vein occlusion    Dyslipidemia    GERD (gastroesophageal reflux disease)    Glaucoma    H/O echocardiogram 09/27/2009   Note to have some aortic valve sclerosis, mild mitral valve prolapse, mild mitral regurgitation, and mild asymptomatic LVH with an EF>55%   Heart murmur    History of stress test 09/27/2009   Normal Myocardial perfusion study. No significant ischemia demonstrated, this low risk scan. compared to the previous study.,there is no significant change, Normal myocardial Perfusion imaging. EF>83%   Hyperlipidemia    Hypertension    Joint pain    Osteoporosis    Seasonal allergies    Stroke ([place])    central retinal occusion - right  eye     Family History  Problem Relation Age of Onset   Cancer Brother        tumor near liver pancreas   half brother   Colon cancer Neg Hx    Esophageal cancer Neg Hx    Pancreatic cancer Neg Hx    Rectal cancer Neg Hx    Stomach cancer Neg Hx      Past Surgical History:  Procedure Laterality Date   ABDOMINAL HYSTERECTOMY  1991   ARTHROSCOPY KNEE W/ DRILLING  2005/2008   Left knee/Right knee   BREAST LUMPECTOMY  2003   EXCISION / BIOPSY BREAST / NIPPLE / DUCT Left    MASTECTOMY Right 2014   MASTECTOMY MODIFIED RADICAL Right 04/15/2013   Procedure: MASTECTOMY MODIFIED RADICAL;  Surgeon: Adin [name], MD;  Location: [facility];  Service: General;  Laterality: Right;   PORTACATH PLACEMENT Left 05/13/2013   Procedure: INSERTION PORT-A-CATH;  Surgeon: Adin [name], MD;  Location: [facility];  Service: General;  Laterality: Left;   TUBAL LIGATION  1983    Social History   Socioeconomic History   Marital status: Married    Spouse name: Not on file   Number of children: 3   Years of education: Not on file   Highest education level: Not on file  Occupational History   Occupation: Retired    Comment: Web designer  Tobacco Use   Smoking status: Never  Smokeless tobacco: Never  Vaping Use   Vaping Use: Never used  Substance and Sexual Activity   Alcohol use: Yes    Comment: occasional glass of wine   Drug use: No   Sexual activity: Yes  Other Topics Concern   Not on file  Social History Narrative   Married   3 sons   6 grandchildren 3/3.   Social Determinants of Health   Financial Resource Strain: Not on file  Food Insecurity: Not on file  Transportation Needs: Not on file  Physical Activity: Not on file  Stress: Not on file  Social Connections: Not on file  Intimate Partner Violence: Not on file     Allergies  Allergen Reactions   Vicodin [Hydrocodone-Acetaminophen] Nausea And Vomiting   Brimonidine Tartrate-Timolol    Dorzolamide Hcl-Timolol  Mal    Percocet [Oxycodone-Acetaminophen] Nausea And Vomiting   Precipitated Sulfur [Elemental Sulfur] Itching    Eye medication   Timolol Rash    Eye medication with preservatives     Outpatient Medications Prior to Visit  Medication Sig Dispense Refill   anastrozole (ARIMIDEX) 1 MG tablet Take 1 tablet (1 mg total) by mouth daily. 90 tablet 3   cholecalciferol (VITAMIN D3) 25 MCG (1000 UT) tablet Take 1 tablet (1,000 Units total) by mouth daily.     dorzolamide (TRUSOPT) 2 % ophthalmic solution 1 drop into affected eye     latanoprost (XALATAN) 0.005 % ophthalmic solution 1 drop into affected eye in the evening     losartan (COZAAR) 50 MG tablet Take 50 mg by mouth daily.     Multiple Vitamin (MULTI-VITAMIN) tablet Take 1 tablet by mouth.     Polyethyl Glycol-Propyl Glycol (SYSTANE) 0.4-0.3 % SOLN See admin instructions.     prednisoLONE acetate (PRED FORTE) 1 % ophthalmic suspension Place 1 drop into the left eye 4 (four) times daily.     rosuvastatin (CRESTOR) 5 MG tablet Take 5 mg by mouth daily.     vitamin B-12 (CYANOCOBALAMIN) 1000 MCG tablet Take 1 tablet (1,000 mcg total) by mouth daily.     calcium carbonate (TUMS - DOSED IN MG ELEMENTAL CALCIUM) 500 MG chewable tablet Chew 2 tablets by mouth daily.     dorzolamide-timolol (COSOPT) 22.3-6.8 MG/ML ophthalmic solution Place 2 drops into the right eye 2 (two) times daily.     TRAVATAN Z 0.004 % SOLN ophthalmic solution Place 1 drop into the right eye at bedtime. Reported on 05/29/2016     Facility-Administered Medications Prior to Visit  Medication Dose Route Frequency Provider Last Rate Last Admin   0.9 %  sodium chloride infusion   Intravenous Once Causey, Charlestine Massed, NP           Objective:   Physical Exam:  General appearance: 80 y.o., female, NAD, conversant  Eyes: anicteric sclerae, moist conjunctivae; no lid-lag; PERRL, tracking appropriately HENT: NCAT; oropharynx, MMM, no mucosal ulcerations; normal hard  and soft palate Neck: Trachea midline; no lymphadenopathy, no JVD Lungs: CTAB, no crackles, no wheeze, with normal respiratory effort CV: RRR, no MRGs, mildly ttp under left breast Abdomen: Soft, non-tender; non-distended, BS present  Extremities: No peripheral edema, radial and DP pulses present bilaterally  Skin: Normal temperature, turgor and texture; no rash Psych: Appropriate affect Neuro: Alert and oriented to person and place, no focal deficit    Vitals:   09/06/21 1336  BP: 122/62  Pulse: 66  Temp: 98.3 F (36.8 C)  TempSrc: Oral  SpO2: 96%  Weight: 136  lb 4 oz (61.8 kg)  Height: 5\' 4"  (1.626 m)   96% on RA BMI Readings from Last 3 Encounters:  09/06/21 23.39 kg/m  01/20/21 21.63 kg/m  10/26/19 22.21 kg/m   Wt Readings from Last 3 Encounters:  09/06/21 136 lb 4 oz (61.8 kg)  01/20/21 126 lb (57.2 kg)  10/26/19 129 lb 6.4 oz (58.7 kg)     CBC    Component Value Date/Time   WBC 4.9 01/20/2021 1136   RBC 4.29 01/20/2021 1136   HGB 13.8 01/20/2021 1136   HGB 12.4 03/01/2015 1424   HCT 41.3 01/20/2021 1136   HCT 37.1 03/01/2015 1424   PLT 207 01/20/2021 1136   PLT 214 03/01/2015 1424   MCV 96.3 01/20/2021 1136   MCV 94.7 03/01/2015 1424   MCH 32.2 01/20/2021 1136   MCHC 33.4 01/20/2021 1136   RDW 11.6 01/20/2021 1136   RDW 12.4 03/01/2015 1424   LYMPHSABS 1.3 03/01/2015 1424   MONOABS 0.3 03/01/2015 1424   EOSABS 0.1 03/01/2015 1424   BASOSABS 0.0 03/01/2015 1424    Chest Imaging: CT Chest 08/16/21 reviewed by me, significant for stable RUL fibrosis/bronchiectasis with RUL volume loss, TIB in RLL  Pulmonary Functions Testing Results: No flowsheet data found.   Echocardiogram:  2019 normal      Assessment & Plan:   # Radiation fibrosis: # Chronic cough: # Tree-in-bud nodules: TIB nodularity may reflect either silent reflux/microaspiration or subclinical infection from something like NTM. Doesn't have chest congestion or significant  productive cough at this time to warrant any airway clearance, no DOE, and not bothered enough by intermittent cough to need to undergo PFTs now. Does sound like post-nasal drainage may additionally be contributory to cough - unfortunately with her glaucoma we should probably avoid nasal steroids.  Plan: - if onset of productive cough and/or constitutional symptoms she'll let us know and we can try to collect sputum for cultures, AFB and start airway clearance if persistent - antihistamine as needed for postnasal drainage   I spent 46 minutes dedicated to the care of this patient on the date of this encounter to include pre-visit review of records, face-to-face time with the patient discussing conditions above, post visit ordering of testing, clinical documentation with the electronic health record, and communicating necessary findings to members of the patients care team.    Maryjane Hurter, MD [facility] 09/06/2021 1:41 PM

## 2021-09-06 ENCOUNTER — Other Ambulatory Visit: Payer: Self-pay

## 2021-09-06 ENCOUNTER — Encounter: Payer: Self-pay | Admitting: Student

## 2021-09-06 ENCOUNTER — Ambulatory Visit: Payer: Medicare Other | Admitting: Student

## 2021-09-06 VITALS — BP 122/62 | HR 66 | Temp 98.3°F | Ht 64.0 in | Wt 136.2 lb

## 2021-09-06 DIAGNOSIS — J701 Chronic and other pulmonary manifestations due to radiation: Secondary | ICD-10-CM

## 2021-09-06 DIAGNOSIS — J479 Bronchiectasis, uncomplicated: Secondary | ICD-10-CM

## 2021-09-06 DIAGNOSIS — R053 Chronic cough: Secondary | ICD-10-CM

## 2021-09-06 NOTE — Patient Instructions (Addendum)
-   if you develop productive cough, fever, night sweats, unintentional weight loss let me know through mychart and we can consider collecting sputum to look for infection (especially non tuberculous mycobacteria), starting to use a flutter valve to clear airways out. - antihistamine for postnasal drip

## 2021-09-10 DIAGNOSIS — H401133 Primary open-angle glaucoma, bilateral, severe stage: Secondary | ICD-10-CM | POA: Diagnosis not present

## 2021-09-23 DIAGNOSIS — H43813 Vitreous degeneration, bilateral: Secondary | ICD-10-CM | POA: Diagnosis not present

## 2021-09-23 DIAGNOSIS — H35371 Puckering of macula, right eye: Secondary | ICD-10-CM | POA: Diagnosis not present

## 2021-09-23 DIAGNOSIS — H35352 Cystoid macular degeneration, left eye: Secondary | ICD-10-CM | POA: Diagnosis not present

## 2021-09-23 DIAGNOSIS — H401130 Primary open-angle glaucoma, bilateral, stage unspecified: Secondary | ICD-10-CM | POA: Diagnosis not present

## 2021-10-24 DIAGNOSIS — H35032 Hypertensive retinopathy, left eye: Secondary | ICD-10-CM | POA: Diagnosis not present

## 2021-10-24 DIAGNOSIS — H35352 Cystoid macular degeneration, left eye: Secondary | ICD-10-CM | POA: Diagnosis not present

## 2021-10-24 DIAGNOSIS — H401123 Primary open-angle glaucoma, left eye, severe stage: Secondary | ICD-10-CM | POA: Diagnosis not present

## 2021-10-24 DIAGNOSIS — H43813 Vitreous degeneration, bilateral: Secondary | ICD-10-CM | POA: Diagnosis not present

## 2021-12-05 DIAGNOSIS — H18232 Secondary corneal edema, left eye: Secondary | ICD-10-CM | POA: Diagnosis not present

## 2021-12-05 DIAGNOSIS — H43813 Vitreous degeneration, bilateral: Secondary | ICD-10-CM | POA: Diagnosis not present

## 2021-12-05 DIAGNOSIS — H35352 Cystoid macular degeneration, left eye: Secondary | ICD-10-CM | POA: Diagnosis not present

## 2021-12-05 DIAGNOSIS — H401130 Primary open-angle glaucoma, bilateral, stage unspecified: Secondary | ICD-10-CM | POA: Diagnosis not present

## 2021-12-05 DIAGNOSIS — H35371 Puckering of macula, right eye: Secondary | ICD-10-CM | POA: Diagnosis not present

## 2021-12-10 DIAGNOSIS — M150 Primary generalized (osteo)arthritis: Secondary | ICD-10-CM | POA: Diagnosis not present

## 2021-12-10 DIAGNOSIS — M130 Polyarthritis, unspecified: Secondary | ICD-10-CM | POA: Diagnosis not present

## 2021-12-10 DIAGNOSIS — Z79899 Other long term (current) drug therapy: Secondary | ICD-10-CM | POA: Diagnosis not present

## 2021-12-10 DIAGNOSIS — M3500 Sicca syndrome, unspecified: Secondary | ICD-10-CM | POA: Diagnosis not present

## 2021-12-18 DIAGNOSIS — H59012 Keratopathy (bullous aphakic) following cataract surgery, left eye: Secondary | ICD-10-CM | POA: Diagnosis not present

## 2021-12-18 DIAGNOSIS — Z79899 Other long term (current) drug therapy: Secondary | ICD-10-CM | POA: Diagnosis not present

## 2021-12-18 DIAGNOSIS — H3581 Retinal edema: Secondary | ICD-10-CM | POA: Diagnosis not present

## 2021-12-18 DIAGNOSIS — H401133 Primary open-angle glaucoma, bilateral, severe stage: Secondary | ICD-10-CM | POA: Diagnosis not present

## 2021-12-18 DIAGNOSIS — H348112 Central retinal vein occlusion, right eye, stable: Secondary | ICD-10-CM | POA: Diagnosis not present

## 2022-01-04 DIAGNOSIS — H5712 Ocular pain, left eye: Secondary | ICD-10-CM | POA: Diagnosis not present

## 2022-01-04 DIAGNOSIS — S0502XA Injury of conjunctiva and corneal abrasion without foreign body, left eye, initial encounter: Secondary | ICD-10-CM | POA: Diagnosis not present

## 2022-01-06 DIAGNOSIS — Z9889 Other specified postprocedural states: Secondary | ICD-10-CM | POA: Diagnosis not present

## 2022-01-06 DIAGNOSIS — Z961 Presence of intraocular lens: Secondary | ICD-10-CM | POA: Diagnosis not present

## 2022-01-06 DIAGNOSIS — Z79899 Other long term (current) drug therapy: Secondary | ICD-10-CM | POA: Diagnosis not present

## 2022-01-06 DIAGNOSIS — H348112 Central retinal vein occlusion, right eye, stable: Secondary | ICD-10-CM | POA: Diagnosis not present

## 2022-01-06 DIAGNOSIS — H1820 Unspecified corneal edema: Secondary | ICD-10-CM | POA: Diagnosis not present

## 2022-01-06 DIAGNOSIS — Z8669 Personal history of other diseases of the nervous system and sense organs: Secondary | ICD-10-CM | POA: Diagnosis not present

## 2022-01-06 DIAGNOSIS — Z9689 Presence of other specified functional implants: Secondary | ICD-10-CM | POA: Diagnosis not present

## 2022-01-06 DIAGNOSIS — H401133 Primary open-angle glaucoma, bilateral, severe stage: Secondary | ICD-10-CM | POA: Diagnosis not present

## 2022-01-06 DIAGNOSIS — S0502XD Injury of conjunctiva and corneal abrasion without foreign body, left eye, subsequent encounter: Secondary | ICD-10-CM | POA: Diagnosis not present

## 2022-01-27 DIAGNOSIS — H401133 Primary open-angle glaucoma, bilateral, severe stage: Secondary | ICD-10-CM | POA: Diagnosis not present

## 2022-02-03 DIAGNOSIS — H401133 Primary open-angle glaucoma, bilateral, severe stage: Secondary | ICD-10-CM | POA: Diagnosis not present

## 2022-02-03 DIAGNOSIS — Z961 Presence of intraocular lens: Secondary | ICD-10-CM | POA: Diagnosis not present

## 2022-02-03 DIAGNOSIS — H1820 Unspecified corneal edema: Secondary | ICD-10-CM | POA: Diagnosis not present

## 2022-02-03 DIAGNOSIS — Z79899 Other long term (current) drug therapy: Secondary | ICD-10-CM | POA: Diagnosis not present

## 2022-02-06 ENCOUNTER — Other Ambulatory Visit: Payer: Self-pay | Admitting: Geriatric Medicine

## 2022-02-06 DIAGNOSIS — Z1231 Encounter for screening mammogram for malignant neoplasm of breast: Secondary | ICD-10-CM

## 2022-02-26 DIAGNOSIS — H348112 Central retinal vein occlusion, right eye, stable: Secondary | ICD-10-CM | POA: Diagnosis not present

## 2022-02-26 DIAGNOSIS — Z961 Presence of intraocular lens: Secondary | ICD-10-CM | POA: Diagnosis not present

## 2022-02-26 DIAGNOSIS — H1820 Unspecified corneal edema: Secondary | ICD-10-CM | POA: Diagnosis not present

## 2022-02-26 DIAGNOSIS — H401132 Primary open-angle glaucoma, bilateral, moderate stage: Secondary | ICD-10-CM | POA: Diagnosis not present

## 2022-03-03 DIAGNOSIS — H401133 Primary open-angle glaucoma, bilateral, severe stage: Secondary | ICD-10-CM | POA: Diagnosis not present

## 2022-03-25 ENCOUNTER — Ambulatory Visit: Payer: Medicare Other

## 2022-03-26 ENCOUNTER — Ambulatory Visit
Admission: RE | Admit: 2022-03-26 | Discharge: 2022-03-26 | Disposition: A | Discharge status: Home / Self Care | Payer: Medicare Other | Source: Ambulatory Visit | Attending: Geriatric Medicine | Admitting: Geriatric Medicine

## 2022-03-26 DIAGNOSIS — Z1231 Encounter for screening mammogram for malignant neoplasm of breast: Secondary | ICD-10-CM

## 2022-04-03 DIAGNOSIS — H1812 Bullous keratopathy, left eye: Secondary | ICD-10-CM | POA: Diagnosis not present

## 2022-04-03 DIAGNOSIS — H1820 Unspecified corneal edema: Secondary | ICD-10-CM | POA: Diagnosis not present

## 2022-04-04 DIAGNOSIS — H1820 Unspecified corneal edema: Secondary | ICD-10-CM | POA: Diagnosis not present

## 2022-04-04 DIAGNOSIS — H401132 Primary open-angle glaucoma, bilateral, moderate stage: Secondary | ICD-10-CM | POA: Diagnosis not present

## 2022-04-11 DIAGNOSIS — H401132 Primary open-angle glaucoma, bilateral, moderate stage: Secondary | ICD-10-CM | POA: Diagnosis not present

## 2022-04-11 DIAGNOSIS — Z48298 Encounter for aftercare following other organ transplant: Secondary | ICD-10-CM | POA: Diagnosis not present

## 2022-04-11 DIAGNOSIS — Z79899 Other long term (current) drug therapy: Secondary | ICD-10-CM | POA: Diagnosis not present

## 2022-04-11 DIAGNOSIS — Z947 Corneal transplant status: Secondary | ICD-10-CM | POA: Diagnosis not present

## 2022-04-11 DIAGNOSIS — H1820 Unspecified corneal edema: Secondary | ICD-10-CM | POA: Diagnosis not present

## 2022-04-30 DIAGNOSIS — G8929 Other chronic pain: Secondary | ICD-10-CM | POA: Diagnosis not present

## 2022-04-30 DIAGNOSIS — M542 Cervicalgia: Secondary | ICD-10-CM | POA: Diagnosis not present

## 2022-05-23 DIAGNOSIS — Z961 Presence of intraocular lens: Secondary | ICD-10-CM | POA: Diagnosis not present

## 2022-05-23 DIAGNOSIS — Z48810 Encounter for surgical aftercare following surgery on the sense organs: Secondary | ICD-10-CM | POA: Diagnosis not present

## 2022-05-23 DIAGNOSIS — Z947 Corneal transplant status: Secondary | ICD-10-CM | POA: Diagnosis not present

## 2022-05-23 DIAGNOSIS — H401133 Primary open-angle glaucoma, bilateral, severe stage: Secondary | ICD-10-CM | POA: Diagnosis not present

## 2022-05-23 DIAGNOSIS — H1820 Unspecified corneal edema: Secondary | ICD-10-CM | POA: Diagnosis not present

## 2022-05-23 DIAGNOSIS — Z79899 Other long term (current) drug therapy: Secondary | ICD-10-CM | POA: Diagnosis not present

## 2022-05-26 IMAGING — CR DG CHEST 2V
2 series · 2 of 2 positions shown · non-contrast
Comparison: Radiographs 05/13/2013 and 04/08/2013

CLINICAL DATA: Acute bilateral thoracic pain for 6 months. No known
injury. History of right breast cancer.

EXAM:
CHEST - 2 VIEW

[w chest pa]
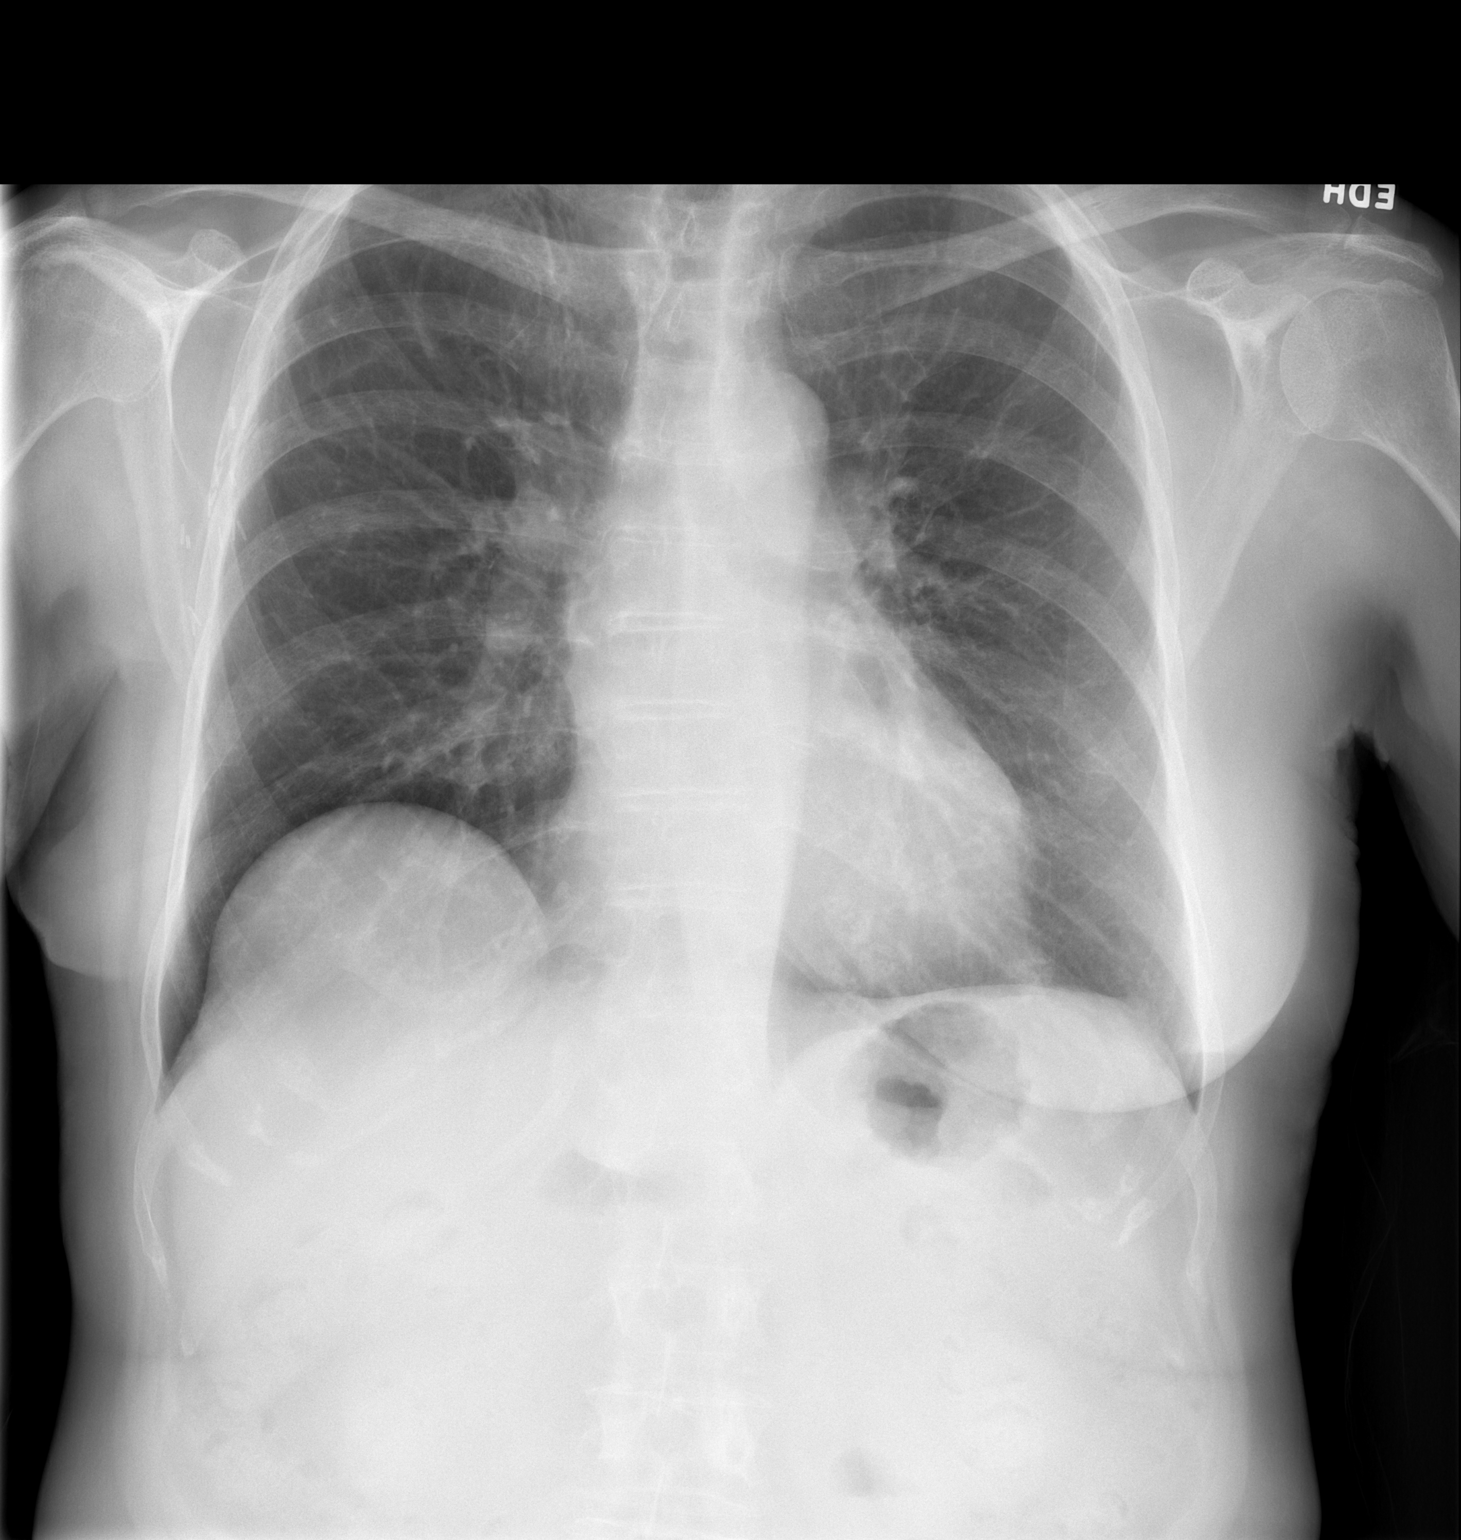

[w chest lat]
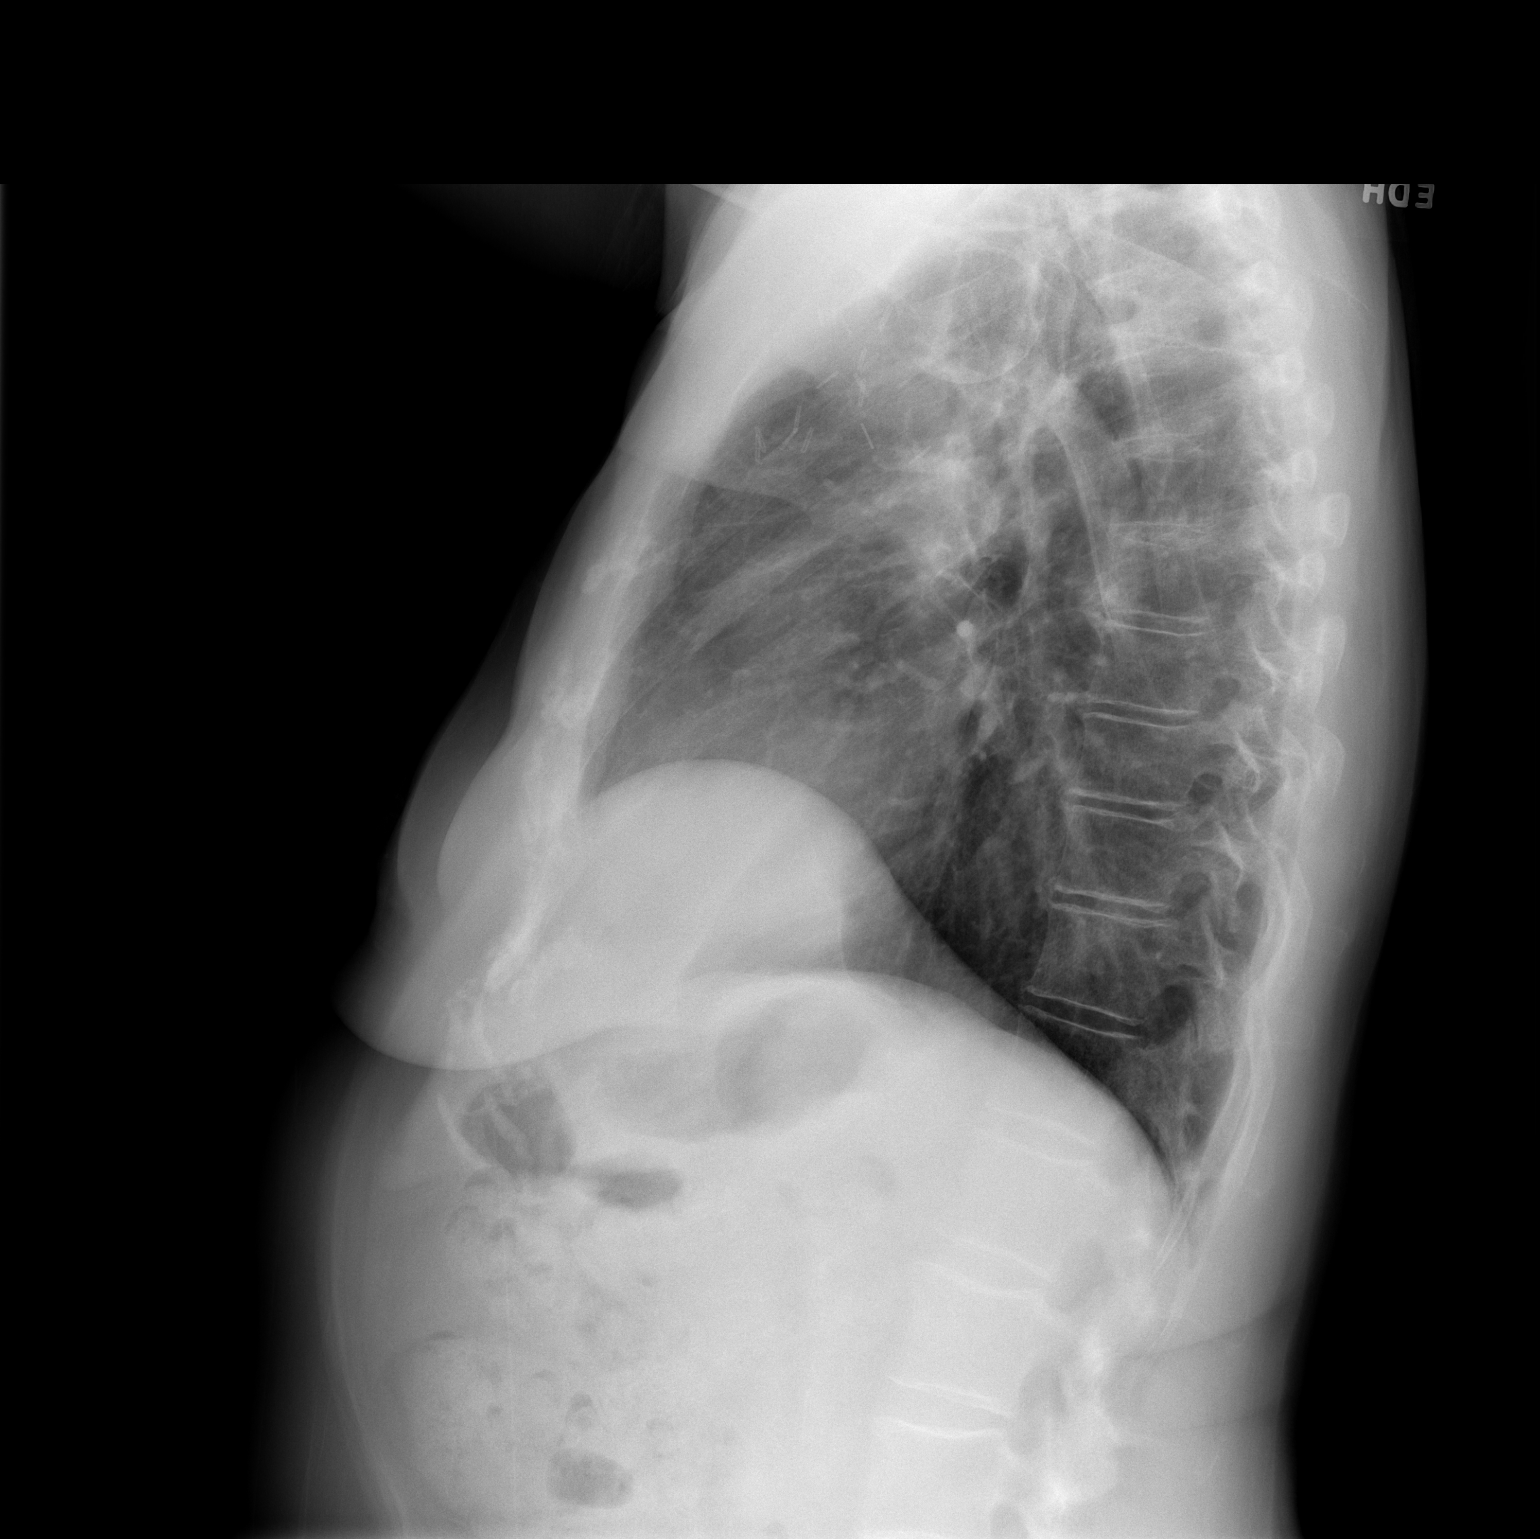

[2 of 2 positions shown; findings below may reference images not displayed]

FINDINGS: The heart size and mediastinal contours are stable without evidence
of adenopathy. There are postsurgical changes in the right breast
and right axilla consistent with mastectomy and axillary node
dissection. There is new ill-defined nodularity at the right apex,
favored to reflect scarring from prior radiation therapy. The lungs
are otherwise clear. There is no pleural effusion or pneumothorax.
No acute osseous findings.
IMPRESSION: No acute findings status post right mastectomy and axillary node
dissection.

New ill-defined nodularity at the right apex, favored to reflect
scarring from prior radiation therapy. A pulmonary nodule is not
completely excluded. Recommend either short-term radiographic
follow-up (3-6 months) or noncontrast chest CT for further
assessment.

## 2022-05-26 IMAGING — CR DG THORACIC SPINE 3V
3 series · 3 of 3 positions shown · non-contrast
Comparison: One view chest 05/13/2013

CLINICAL DATA: Acute bilateral thoracic pain for 6 months. No known
injury. History of right breast cancer.

EXAM:
THORACIC SPINE - 3 VIEWS

[t t-spine a.p.]
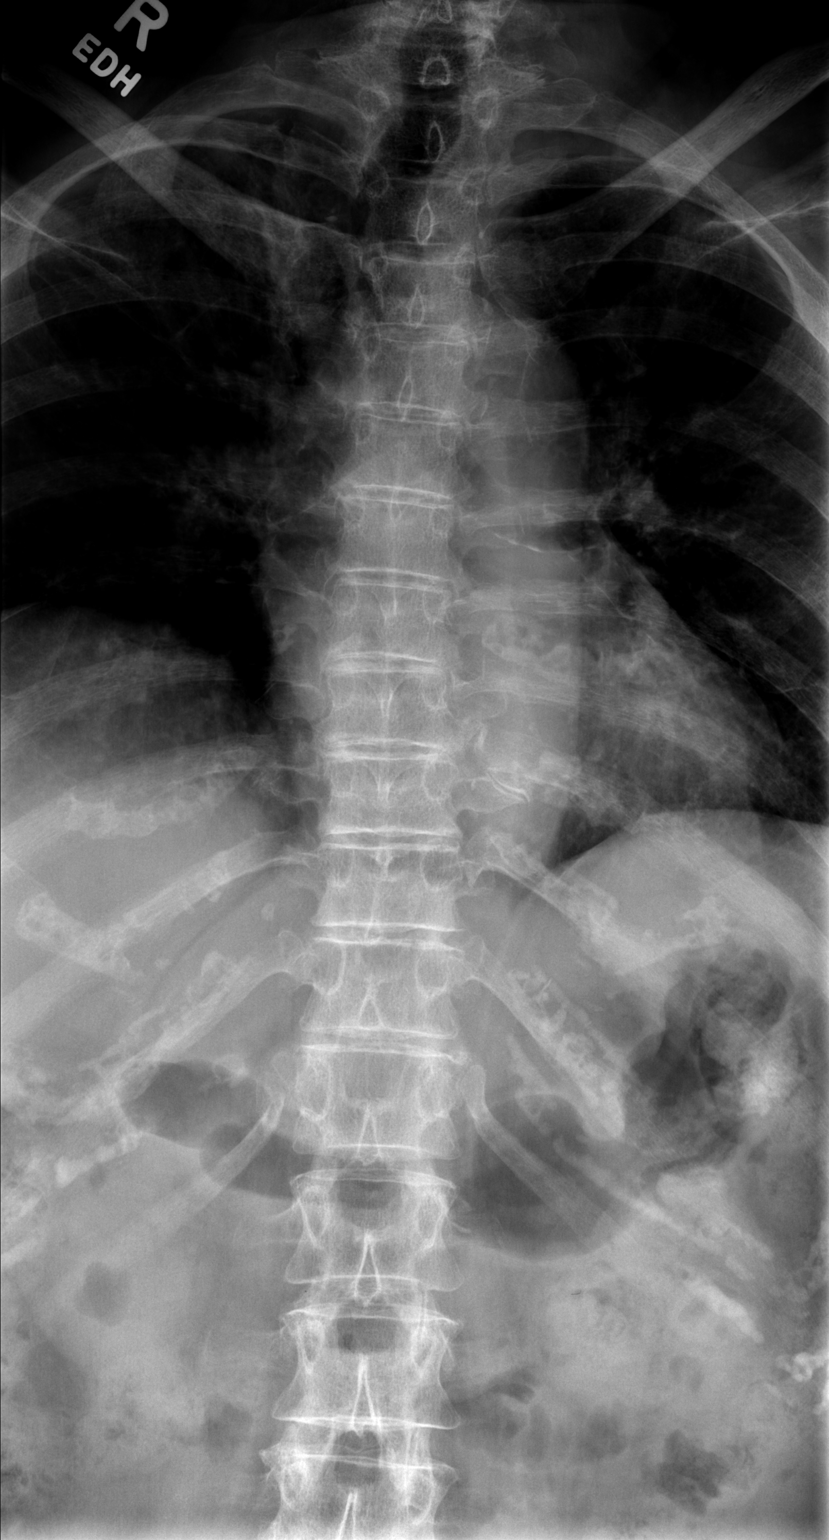

[t t-spine lat]
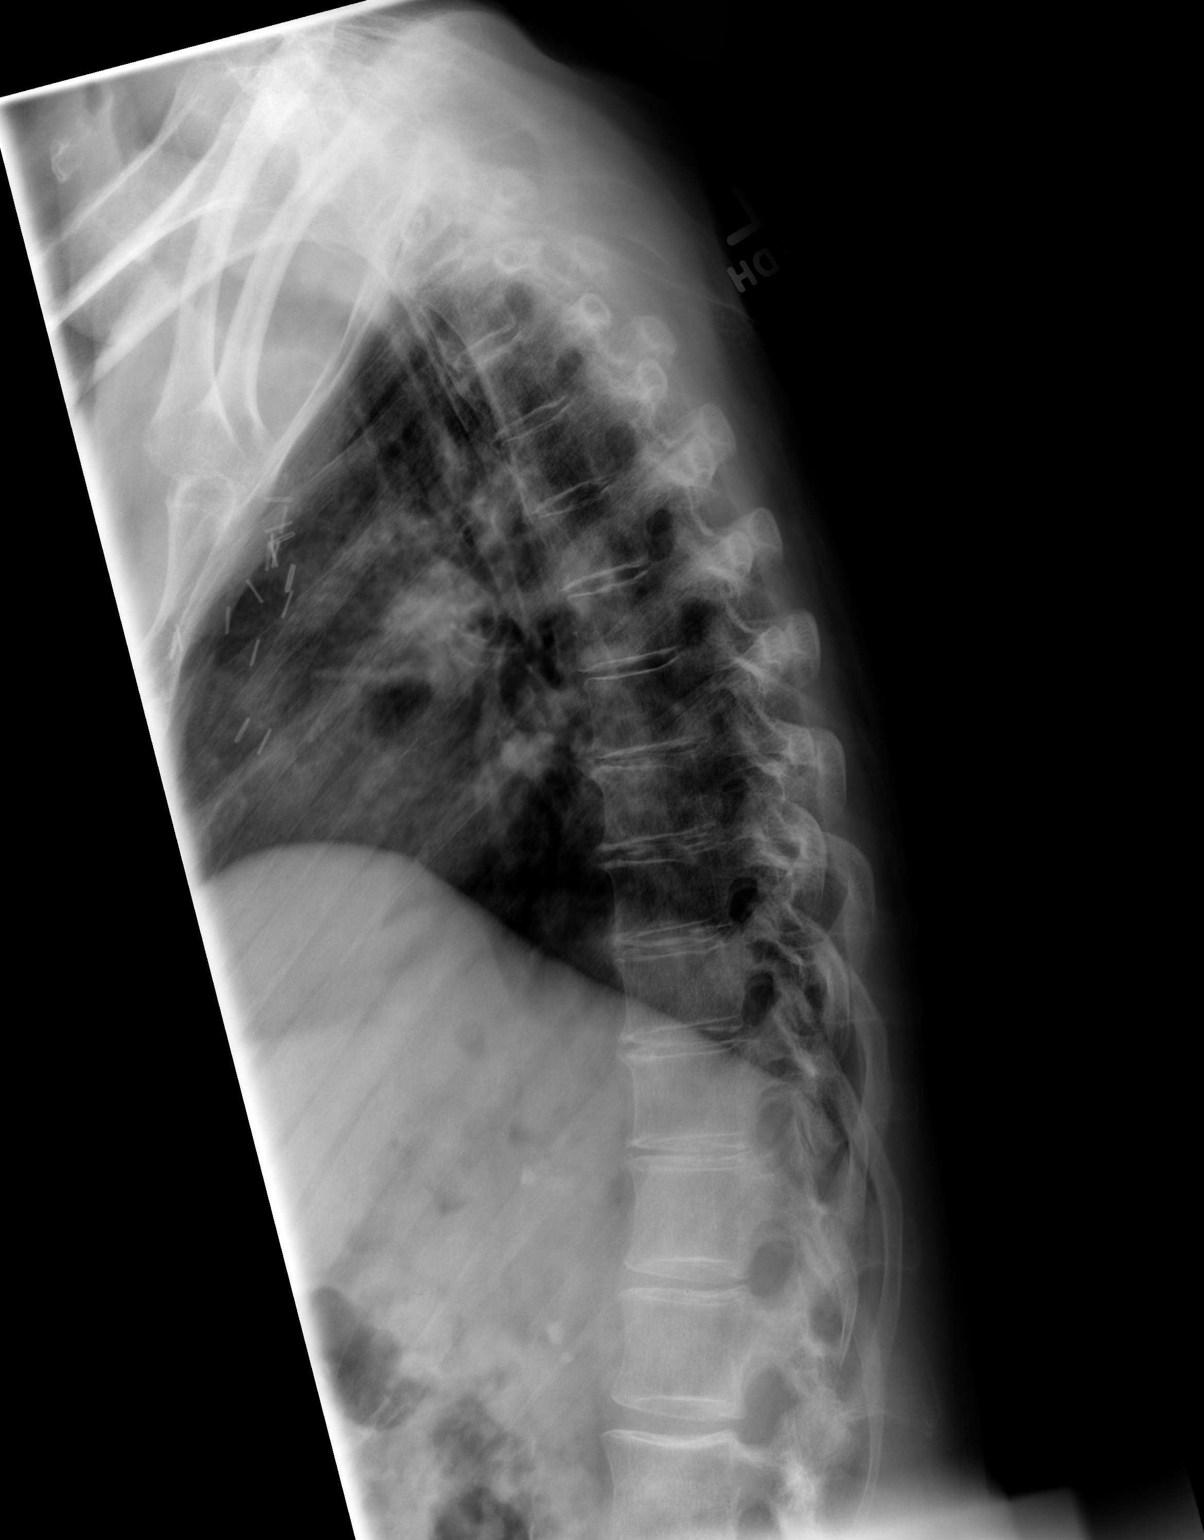

[t swimmers]
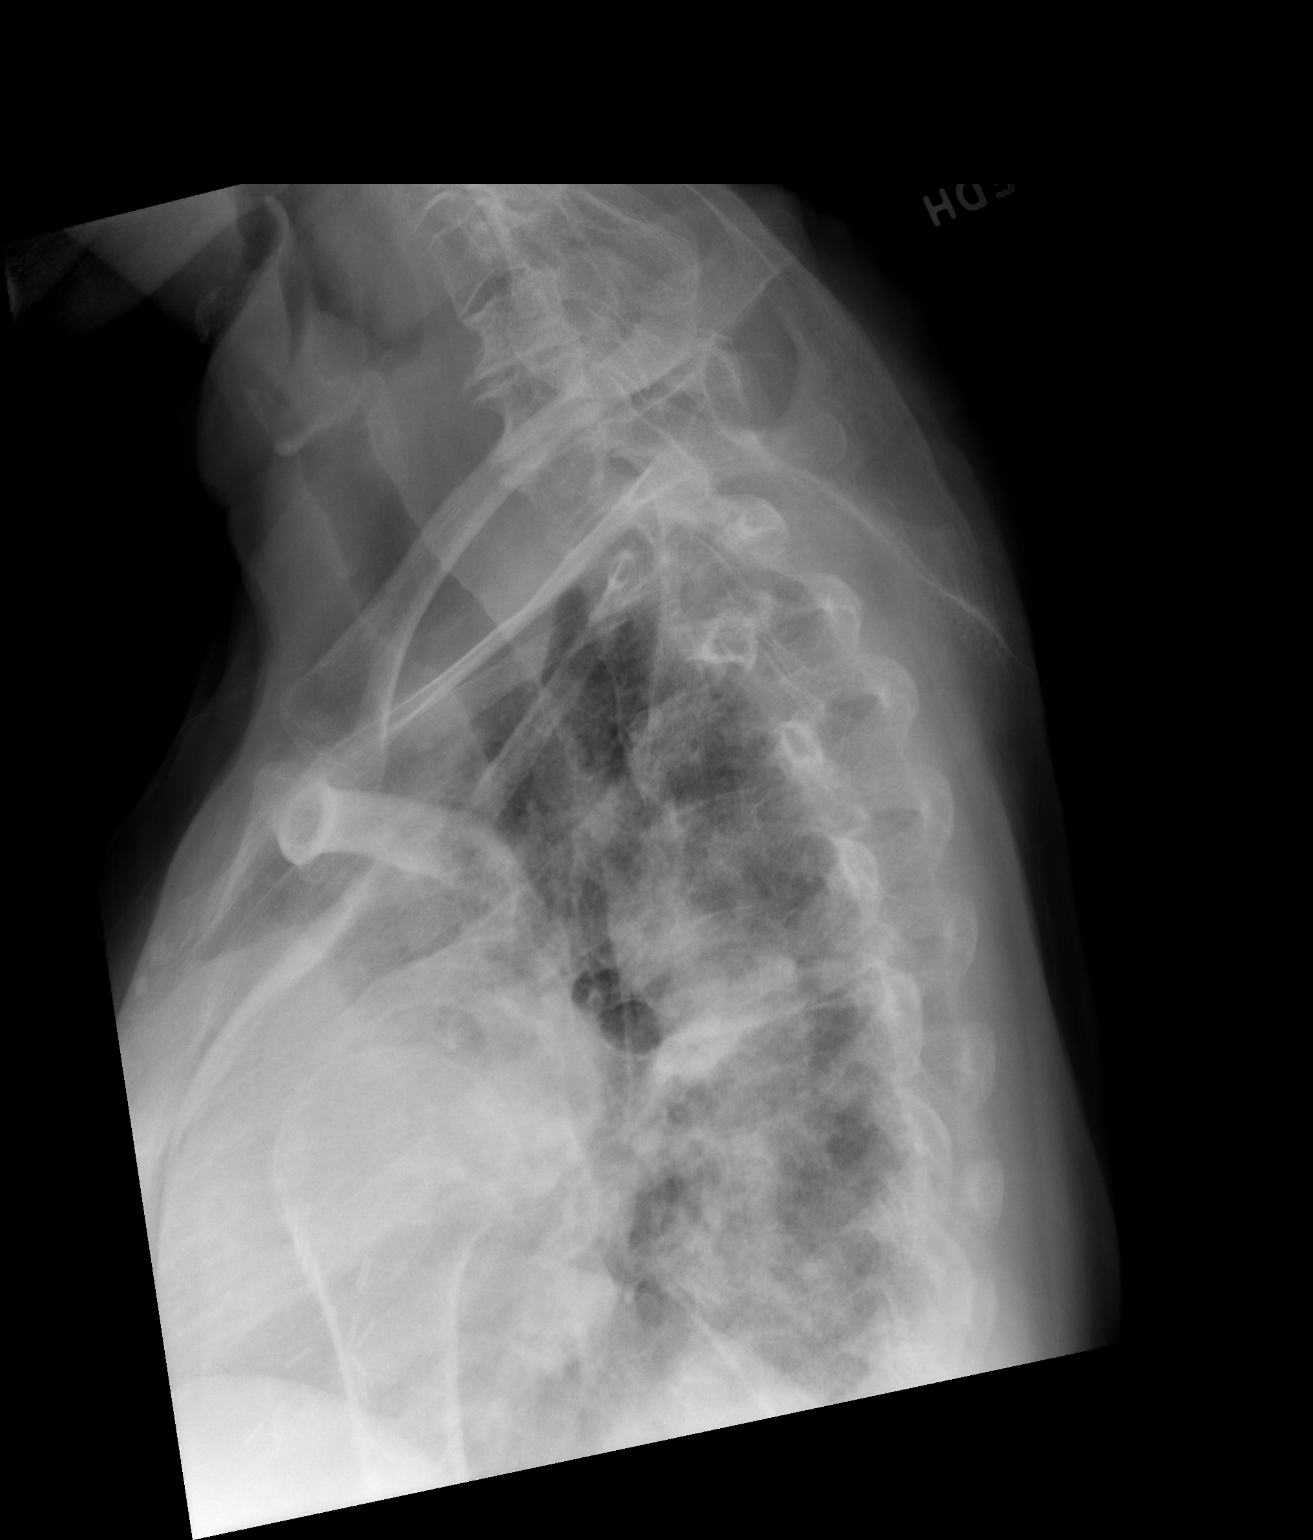

[3 of 3 positions shown; findings below may reference images not displayed]

FINDINGS: There are 12 rib-bearing thoracic type vertebral bodies. The
alignment is normal. No evidence of fracture, paraspinal hematoma or
widening of the interpedicular distance. No focal lytic or blastic
lesions are identified. There are mild degenerative changes
throughout the thoracic spine. Postsurgical changes are present in
the right axilla.
IMPRESSION: No acute findings. Mild thoracic spondylosis.

## 2022-06-09 DIAGNOSIS — H348112 Central retinal vein occlusion, right eye, stable: Secondary | ICD-10-CM | POA: Diagnosis not present

## 2022-06-09 DIAGNOSIS — Z961 Presence of intraocular lens: Secondary | ICD-10-CM | POA: Diagnosis not present

## 2022-06-09 DIAGNOSIS — Z947 Corneal transplant status: Secondary | ICD-10-CM | POA: Diagnosis not present

## 2022-06-09 DIAGNOSIS — H401133 Primary open-angle glaucoma, bilateral, severe stage: Secondary | ICD-10-CM | POA: Diagnosis not present

## 2022-06-11 DIAGNOSIS — M542 Cervicalgia: Secondary | ICD-10-CM | POA: Diagnosis not present

## 2022-06-11 DIAGNOSIS — H401133 Primary open-angle glaucoma, bilateral, severe stage: Secondary | ICD-10-CM | POA: Diagnosis not present

## 2022-06-11 DIAGNOSIS — R5383 Other fatigue: Secondary | ICD-10-CM | POA: Diagnosis not present

## 2022-07-07 DIAGNOSIS — H348112 Central retinal vein occlusion, right eye, stable: Secondary | ICD-10-CM | POA: Diagnosis not present

## 2022-07-07 DIAGNOSIS — Z961 Presence of intraocular lens: Secondary | ICD-10-CM | POA: Diagnosis not present

## 2022-07-07 DIAGNOSIS — Z947 Corneal transplant status: Secondary | ICD-10-CM | POA: Diagnosis not present

## 2022-07-07 DIAGNOSIS — H1820 Unspecified corneal edema: Secondary | ICD-10-CM | POA: Diagnosis not present

## 2022-07-07 DIAGNOSIS — Z79899 Other long term (current) drug therapy: Secondary | ICD-10-CM | POA: Diagnosis not present

## 2022-07-07 DIAGNOSIS — H401133 Primary open-angle glaucoma, bilateral, severe stage: Secondary | ICD-10-CM | POA: Diagnosis not present

## 2022-07-09 DIAGNOSIS — L720 Epidermal cyst: Secondary | ICD-10-CM | POA: Diagnosis not present

## 2022-07-09 DIAGNOSIS — M71371 Other bursal cyst, right ankle and foot: Secondary | ICD-10-CM | POA: Diagnosis not present

## 2022-07-09 DIAGNOSIS — D225 Melanocytic nevi of trunk: Secondary | ICD-10-CM | POA: Diagnosis not present

## 2022-07-09 DIAGNOSIS — L570 Actinic keratosis: Secondary | ICD-10-CM | POA: Diagnosis not present

## 2022-07-09 DIAGNOSIS — L821 Other seborrheic keratosis: Secondary | ICD-10-CM | POA: Diagnosis not present

## 2022-07-09 DIAGNOSIS — Z85828 Personal history of other malignant neoplasm of skin: Secondary | ICD-10-CM | POA: Diagnosis not present

## 2022-08-20 DIAGNOSIS — C50919 Malignant neoplasm of unspecified site of unspecified female breast: Secondary | ICD-10-CM | POA: Diagnosis not present

## 2022-08-20 DIAGNOSIS — Z0000 Encounter for general adult medical examination without abnormal findings: Secondary | ICD-10-CM | POA: Diagnosis not present

## 2022-08-20 DIAGNOSIS — I10 Essential (primary) hypertension: Secondary | ICD-10-CM | POA: Diagnosis not present

## 2022-08-20 DIAGNOSIS — E7800 Pure hypercholesterolemia, unspecified: Secondary | ICD-10-CM | POA: Diagnosis not present

## 2022-08-20 DIAGNOSIS — M3500 Sicca syndrome, unspecified: Secondary | ICD-10-CM | POA: Diagnosis not present

## 2022-08-20 DIAGNOSIS — F5101 Primary insomnia: Secondary | ICD-10-CM | POA: Diagnosis not present

## 2022-08-20 DIAGNOSIS — H04123 Dry eye syndrome of bilateral lacrimal glands: Secondary | ICD-10-CM | POA: Diagnosis not present

## 2022-08-21 DIAGNOSIS — E7800 Pure hypercholesterolemia, unspecified: Secondary | ICD-10-CM | POA: Diagnosis not present

## 2022-08-27 DIAGNOSIS — H348112 Central retinal vein occlusion, right eye, stable: Secondary | ICD-10-CM | POA: Diagnosis not present

## 2022-08-27 DIAGNOSIS — H401133 Primary open-angle glaucoma, bilateral, severe stage: Secondary | ICD-10-CM | POA: Diagnosis not present

## 2022-08-27 DIAGNOSIS — Z947 Corneal transplant status: Secondary | ICD-10-CM | POA: Diagnosis not present

## 2022-08-27 DIAGNOSIS — Z961 Presence of intraocular lens: Secondary | ICD-10-CM | POA: Diagnosis not present

## 2022-09-11 DIAGNOSIS — H401133 Primary open-angle glaucoma, bilateral, severe stage: Secondary | ICD-10-CM | POA: Diagnosis not present

## 2022-10-27 DIAGNOSIS — R051 Acute cough: Secondary | ICD-10-CM | POA: Diagnosis not present

## 2022-12-04 DIAGNOSIS — H52223 Regular astigmatism, bilateral: Secondary | ICD-10-CM | POA: Diagnosis not present

## 2022-12-04 DIAGNOSIS — H524 Presbyopia: Secondary | ICD-10-CM | POA: Diagnosis not present

## 2022-12-11 ENCOUNTER — Other Ambulatory Visit: Payer: Self-pay | Admitting: Internal Medicine

## 2022-12-11 DIAGNOSIS — Z1231 Encounter for screening mammogram for malignant neoplasm of breast: Secondary | ICD-10-CM

## 2022-12-16 DIAGNOSIS — Z853 Personal history of malignant neoplasm of breast: Secondary | ICD-10-CM | POA: Diagnosis not present

## 2022-12-16 DIAGNOSIS — M79621 Pain in right upper arm: Secondary | ICD-10-CM | POA: Diagnosis not present

## 2023-01-02 DIAGNOSIS — R2231 Localized swelling, mass and lump, right upper limb: Secondary | ICD-10-CM | POA: Diagnosis not present

## 2023-01-02 DIAGNOSIS — M79621 Pain in right upper arm: Secondary | ICD-10-CM | POA: Diagnosis not present

## 2023-01-13 DIAGNOSIS — M25562 Pain in left knee: Secondary | ICD-10-CM | POA: Diagnosis not present

## 2023-01-23 DIAGNOSIS — C50911 Malignant neoplasm of unspecified site of right female breast: Secondary | ICD-10-CM | POA: Diagnosis not present

## 2023-01-26 DIAGNOSIS — H401133 Primary open-angle glaucoma, bilateral, severe stage: Secondary | ICD-10-CM | POA: Diagnosis not present

## 2023-01-28 DIAGNOSIS — I340 Nonrheumatic mitral (valve) insufficiency: Secondary | ICD-10-CM | POA: Diagnosis not present

## 2023-01-28 DIAGNOSIS — R5383 Other fatigue: Secondary | ICD-10-CM | POA: Diagnosis not present

## 2023-01-28 DIAGNOSIS — R450 Nervousness: Secondary | ICD-10-CM | POA: Diagnosis not present

## 2023-01-28 DIAGNOSIS — I10 Essential (primary) hypertension: Secondary | ICD-10-CM | POA: Diagnosis not present

## 2023-02-04 DIAGNOSIS — I340 Nonrheumatic mitral (valve) insufficiency: Secondary | ICD-10-CM | POA: Diagnosis not present

## 2023-02-05 DIAGNOSIS — C50911 Malignant neoplasm of unspecified site of right female breast: Secondary | ICD-10-CM | POA: Diagnosis not present

## 2023-02-06 ENCOUNTER — Other Ambulatory Visit: Payer: Self-pay | Admitting: Internal Medicine

## 2023-02-06 DIAGNOSIS — M79601 Pain in right arm: Secondary | ICD-10-CM

## 2023-03-04 DIAGNOSIS — H348112 Central retinal vein occlusion, right eye, stable: Secondary | ICD-10-CM | POA: Diagnosis not present

## 2023-03-04 DIAGNOSIS — H401130 Primary open-angle glaucoma, bilateral, stage unspecified: Secondary | ICD-10-CM | POA: Diagnosis not present

## 2023-03-04 DIAGNOSIS — H04123 Dry eye syndrome of bilateral lacrimal glands: Secondary | ICD-10-CM | POA: Diagnosis not present

## 2023-03-04 DIAGNOSIS — H02886 Meibomian gland dysfunction of left eye, unspecified eyelid: Secondary | ICD-10-CM | POA: Diagnosis not present

## 2023-03-04 DIAGNOSIS — H02883 Meibomian gland dysfunction of right eye, unspecified eyelid: Secondary | ICD-10-CM | POA: Diagnosis not present

## 2023-03-04 DIAGNOSIS — H04129 Dry eye syndrome of unspecified lacrimal gland: Secondary | ICD-10-CM | POA: Diagnosis not present

## 2023-03-17 DIAGNOSIS — S46012A Strain of muscle(s) and tendon(s) of the rotator cuff of left shoulder, initial encounter: Secondary | ICD-10-CM | POA: Diagnosis not present

## 2023-03-24 DIAGNOSIS — M3501 Sicca syndrome with keratoconjunctivitis: Secondary | ICD-10-CM | POA: Diagnosis not present

## 2023-03-24 DIAGNOSIS — M1991 Primary osteoarthritis, unspecified site: Secondary | ICD-10-CM | POA: Diagnosis not present

## 2023-03-25 DIAGNOSIS — H401133 Primary open-angle glaucoma, bilateral, severe stage: Secondary | ICD-10-CM | POA: Diagnosis not present

## 2023-03-25 DIAGNOSIS — Z961 Presence of intraocular lens: Secondary | ICD-10-CM | POA: Diagnosis not present

## 2023-03-25 DIAGNOSIS — Z947 Corneal transplant status: Secondary | ICD-10-CM | POA: Diagnosis not present

## 2023-03-25 DIAGNOSIS — H348112 Central retinal vein occlusion, right eye, stable: Secondary | ICD-10-CM | POA: Diagnosis not present

## 2023-03-30 ENCOUNTER — Ambulatory Visit
Admission: RE | Admit: 2023-03-30 | Discharge: 2023-03-30 | Disposition: A | Discharge status: Home / Self Care | Payer: Medicare Other | Source: Ambulatory Visit | Attending: Internal Medicine | Admitting: Internal Medicine

## 2023-03-30 DIAGNOSIS — Z1231 Encounter for screening mammogram for malignant neoplasm of breast: Secondary | ICD-10-CM

## 2023-03-31 DIAGNOSIS — L570 Actinic keratosis: Secondary | ICD-10-CM | POA: Diagnosis not present

## 2023-03-31 DIAGNOSIS — L821 Other seborrheic keratosis: Secondary | ICD-10-CM | POA: Diagnosis not present

## 2023-03-31 DIAGNOSIS — D0461 Carcinoma in situ of skin of right upper limb, including shoulder: Secondary | ICD-10-CM | POA: Diagnosis not present

## 2023-03-31 DIAGNOSIS — B078 Other viral warts: Secondary | ICD-10-CM | POA: Diagnosis not present

## 2023-03-31 DIAGNOSIS — Z85828 Personal history of other malignant neoplasm of skin: Secondary | ICD-10-CM | POA: Diagnosis not present

## 2023-04-20 ENCOUNTER — Ambulatory Visit
Admission: RE | Admit: 2023-04-20 | Discharge: 2023-04-20 | Disposition: A | Discharge status: Home / Self Care | Payer: Medicare Other | Source: Ambulatory Visit | Attending: Physician Assistant | Admitting: Physician Assistant

## 2023-04-20 ENCOUNTER — Other Ambulatory Visit: Payer: Self-pay | Admitting: Physician Assistant

## 2023-04-20 DIAGNOSIS — R059 Cough, unspecified: Secondary | ICD-10-CM | POA: Diagnosis not present

## 2023-04-20 DIAGNOSIS — R053 Chronic cough: Secondary | ICD-10-CM

## 2023-04-20 DIAGNOSIS — R030 Elevated blood-pressure reading, without diagnosis of hypertension: Secondary | ICD-10-CM | POA: Diagnosis not present

## 2023-04-27 DIAGNOSIS — H401133 Primary open-angle glaucoma, bilateral, severe stage: Secondary | ICD-10-CM | POA: Diagnosis not present

## 2023-05-07 ENCOUNTER — Other Ambulatory Visit: Payer: Medicare Other

## 2023-05-27 DIAGNOSIS — H401133 Primary open-angle glaucoma, bilateral, severe stage: Secondary | ICD-10-CM | POA: Diagnosis not present

## 2023-05-27 DIAGNOSIS — H348112 Central retinal vein occlusion, right eye, stable: Secondary | ICD-10-CM | POA: Diagnosis not present

## 2023-05-27 DIAGNOSIS — H02886 Meibomian gland dysfunction of left eye, unspecified eyelid: Secondary | ICD-10-CM | POA: Diagnosis not present

## 2023-05-27 DIAGNOSIS — Z961 Presence of intraocular lens: Secondary | ICD-10-CM | POA: Diagnosis not present

## 2023-05-27 DIAGNOSIS — Z947 Corneal transplant status: Secondary | ICD-10-CM | POA: Diagnosis not present

## 2023-05-27 DIAGNOSIS — M3500 Sicca syndrome, unspecified: Secondary | ICD-10-CM | POA: Diagnosis not present

## 2023-06-28 DIAGNOSIS — R051 Acute cough: Secondary | ICD-10-CM | POA: Diagnosis not present

## 2023-06-28 DIAGNOSIS — R5383 Other fatigue: Secondary | ICD-10-CM | POA: Diagnosis not present

## 2023-06-28 DIAGNOSIS — Z03818 Encounter for observation for suspected exposure to other biological agents ruled out: Secondary | ICD-10-CM | POA: Diagnosis not present

## 2023-06-28 DIAGNOSIS — R0981 Nasal congestion: Secondary | ICD-10-CM | POA: Diagnosis not present

## 2023-07-10 DIAGNOSIS — M3500 Sicca syndrome, unspecified: Secondary | ICD-10-CM | POA: Diagnosis not present

## 2023-07-10 DIAGNOSIS — I5189 Other ill-defined heart diseases: Secondary | ICD-10-CM | POA: Diagnosis not present

## 2023-07-10 DIAGNOSIS — J479 Bronchiectasis, uncomplicated: Secondary | ICD-10-CM | POA: Diagnosis not present

## 2023-07-10 DIAGNOSIS — R9389 Abnormal findings on diagnostic imaging of other specified body structures: Secondary | ICD-10-CM | POA: Diagnosis not present

## 2023-07-10 DIAGNOSIS — R0981 Nasal congestion: Secondary | ICD-10-CM | POA: Diagnosis not present

## 2023-07-10 DIAGNOSIS — I700 Atherosclerosis of aorta: Secondary | ICD-10-CM | POA: Diagnosis not present

## 2023-07-10 DIAGNOSIS — I10 Essential (primary) hypertension: Secondary | ICD-10-CM | POA: Diagnosis not present

## 2023-07-13 DIAGNOSIS — L814 Other melanin hyperpigmentation: Secondary | ICD-10-CM | POA: Diagnosis not present

## 2023-07-13 DIAGNOSIS — L570 Actinic keratosis: Secondary | ICD-10-CM | POA: Diagnosis not present

## 2023-07-13 DIAGNOSIS — Z85828 Personal history of other malignant neoplasm of skin: Secondary | ICD-10-CM | POA: Diagnosis not present

## 2023-07-13 DIAGNOSIS — L821 Other seborrheic keratosis: Secondary | ICD-10-CM | POA: Diagnosis not present

## 2023-07-13 DIAGNOSIS — D225 Melanocytic nevi of trunk: Secondary | ICD-10-CM | POA: Diagnosis not present

## 2023-07-13 DIAGNOSIS — C44519 Basal cell carcinoma of skin of other part of trunk: Secondary | ICD-10-CM | POA: Diagnosis not present

## 2023-08-05 DIAGNOSIS — Z947 Corneal transplant status: Secondary | ICD-10-CM | POA: Diagnosis not present

## 2023-08-05 DIAGNOSIS — H401133 Primary open-angle glaucoma, bilateral, severe stage: Secondary | ICD-10-CM | POA: Diagnosis not present

## 2023-08-05 DIAGNOSIS — H02886 Meibomian gland dysfunction of left eye, unspecified eyelid: Secondary | ICD-10-CM | POA: Diagnosis not present

## 2023-08-05 DIAGNOSIS — H348112 Central retinal vein occlusion, right eye, stable: Secondary | ICD-10-CM | POA: Diagnosis not present

## 2023-08-05 DIAGNOSIS — M3500 Sicca syndrome, unspecified: Secondary | ICD-10-CM | POA: Diagnosis not present

## 2023-08-05 DIAGNOSIS — Z961 Presence of intraocular lens: Secondary | ICD-10-CM | POA: Diagnosis not present

## 2023-08-31 DIAGNOSIS — H401133 Primary open-angle glaucoma, bilateral, severe stage: Secondary | ICD-10-CM | POA: Diagnosis not present

## 2023-09-09 ENCOUNTER — Other Ambulatory Visit: Payer: Self-pay | Admitting: Internal Medicine

## 2023-09-09 DIAGNOSIS — E7800 Pure hypercholesterolemia, unspecified: Secondary | ICD-10-CM | POA: Diagnosis not present

## 2023-09-09 DIAGNOSIS — R1032 Left lower quadrant pain: Secondary | ICD-10-CM | POA: Diagnosis not present

## 2023-09-09 DIAGNOSIS — M3500 Sicca syndrome, unspecified: Secondary | ICD-10-CM | POA: Diagnosis not present

## 2023-09-09 DIAGNOSIS — Z79899 Other long term (current) drug therapy: Secondary | ICD-10-CM | POA: Diagnosis not present

## 2023-09-09 DIAGNOSIS — M8580 Other specified disorders of bone density and structure, unspecified site: Secondary | ICD-10-CM

## 2023-09-09 DIAGNOSIS — M8588 Other specified disorders of bone density and structure, other site: Secondary | ICD-10-CM | POA: Diagnosis not present

## 2023-09-09 DIAGNOSIS — Z0000 Encounter for general adult medical examination without abnormal findings: Secondary | ICD-10-CM | POA: Diagnosis not present

## 2023-09-09 DIAGNOSIS — R9389 Abnormal findings on diagnostic imaging of other specified body structures: Secondary | ICD-10-CM | POA: Diagnosis not present

## 2023-09-09 DIAGNOSIS — I10 Essential (primary) hypertension: Secondary | ICD-10-CM | POA: Diagnosis not present

## 2023-09-09 DIAGNOSIS — I5189 Other ill-defined heart diseases: Secondary | ICD-10-CM | POA: Diagnosis not present

## 2023-09-09 DIAGNOSIS — I700 Atherosclerosis of aorta: Secondary | ICD-10-CM | POA: Diagnosis not present

## 2023-09-09 DIAGNOSIS — J479 Bronchiectasis, uncomplicated: Secondary | ICD-10-CM | POA: Diagnosis not present

## 2023-09-17 ENCOUNTER — Ambulatory Visit
Admission: RE | Admit: 2023-09-17 | Discharge: 2023-09-17 | Disposition: A | Discharge status: Home / Self Care | Payer: Medicare Other | Source: Ambulatory Visit | Attending: Internal Medicine | Admitting: Internal Medicine

## 2023-09-17 DIAGNOSIS — M8580 Other specified disorders of bone density and structure, unspecified site: Secondary | ICD-10-CM

## 2023-09-17 DIAGNOSIS — M8588 Other specified disorders of bone density and structure, other site: Secondary | ICD-10-CM | POA: Diagnosis not present

## 2023-09-18 DIAGNOSIS — Z961 Presence of intraocular lens: Secondary | ICD-10-CM | POA: Diagnosis not present

## 2023-09-18 DIAGNOSIS — M3500 Sicca syndrome, unspecified: Secondary | ICD-10-CM | POA: Diagnosis not present

## 2023-09-18 DIAGNOSIS — Z947 Corneal transplant status: Secondary | ICD-10-CM | POA: Diagnosis not present

## 2023-09-18 DIAGNOSIS — H02886 Meibomian gland dysfunction of left eye, unspecified eyelid: Secondary | ICD-10-CM | POA: Diagnosis not present

## 2023-09-23 DIAGNOSIS — C50912 Malignant neoplasm of unspecified site of left female breast: Secondary | ICD-10-CM | POA: Diagnosis not present

## 2023-10-01 DIAGNOSIS — C50912 Malignant neoplasm of unspecified site of left female breast: Secondary | ICD-10-CM | POA: Diagnosis not present

## 2023-10-07 DIAGNOSIS — H401133 Primary open-angle glaucoma, bilateral, severe stage: Secondary | ICD-10-CM | POA: Diagnosis not present

## 2023-10-07 DIAGNOSIS — H348112 Central retinal vein occlusion, right eye, stable: Secondary | ICD-10-CM | POA: Diagnosis not present

## 2023-10-07 DIAGNOSIS — H04123 Dry eye syndrome of bilateral lacrimal glands: Secondary | ICD-10-CM | POA: Diagnosis not present

## 2023-10-07 DIAGNOSIS — Z961 Presence of intraocular lens: Secondary | ICD-10-CM | POA: Diagnosis not present

## 2023-10-07 DIAGNOSIS — H02886 Meibomian gland dysfunction of left eye, unspecified eyelid: Secondary | ICD-10-CM | POA: Diagnosis not present

## 2023-10-07 DIAGNOSIS — Z947 Corneal transplant status: Secondary | ICD-10-CM | POA: Diagnosis not present

## 2023-10-07 DIAGNOSIS — M3500 Sicca syndrome, unspecified: Secondary | ICD-10-CM | POA: Diagnosis not present

## 2023-11-05 DIAGNOSIS — H353131 Nonexudative age-related macular degeneration, bilateral, early dry stage: Secondary | ICD-10-CM | POA: Diagnosis not present

## 2023-11-05 DIAGNOSIS — Z947 Corneal transplant status: Secondary | ICD-10-CM | POA: Diagnosis not present

## 2023-11-05 DIAGNOSIS — Z961 Presence of intraocular lens: Secondary | ICD-10-CM | POA: Diagnosis not present

## 2023-11-05 DIAGNOSIS — H47013 Ischemic optic neuropathy, bilateral: Secondary | ICD-10-CM | POA: Diagnosis not present

## 2023-11-05 DIAGNOSIS — H43813 Vitreous degeneration, bilateral: Secondary | ICD-10-CM | POA: Diagnosis not present

## 2023-11-05 DIAGNOSIS — H401134 Primary open-angle glaucoma, bilateral, indeterminate stage: Secondary | ICD-10-CM | POA: Diagnosis not present

## 2023-11-05 DIAGNOSIS — H524 Presbyopia: Secondary | ICD-10-CM | POA: Diagnosis not present

## 2023-11-06 DIAGNOSIS — Z961 Presence of intraocular lens: Secondary | ICD-10-CM | POA: Diagnosis not present

## 2023-11-06 DIAGNOSIS — H401134 Primary open-angle glaucoma, bilateral, indeterminate stage: Secondary | ICD-10-CM | POA: Diagnosis not present

## 2023-11-06 DIAGNOSIS — Z947 Corneal transplant status: Secondary | ICD-10-CM | POA: Diagnosis not present

## 2023-11-06 DIAGNOSIS — H47013 Ischemic optic neuropathy, bilateral: Secondary | ICD-10-CM | POA: Diagnosis not present

## 2023-11-13 DIAGNOSIS — H47013 Ischemic optic neuropathy, bilateral: Secondary | ICD-10-CM | POA: Diagnosis not present

## 2023-11-13 DIAGNOSIS — Z961 Presence of intraocular lens: Secondary | ICD-10-CM | POA: Diagnosis not present

## 2023-11-13 DIAGNOSIS — H401134 Primary open-angle glaucoma, bilateral, indeterminate stage: Secondary | ICD-10-CM | POA: Diagnosis not present

## 2023-11-13 DIAGNOSIS — Z947 Corneal transplant status: Secondary | ICD-10-CM | POA: Diagnosis not present

## 2023-11-17 DIAGNOSIS — Z947 Corneal transplant status: Secondary | ICD-10-CM | POA: Diagnosis not present

## 2023-11-17 DIAGNOSIS — Z961 Presence of intraocular lens: Secondary | ICD-10-CM | POA: Diagnosis not present

## 2023-11-17 DIAGNOSIS — H47013 Ischemic optic neuropathy, bilateral: Secondary | ICD-10-CM | POA: Diagnosis not present

## 2023-11-17 DIAGNOSIS — H401134 Primary open-angle glaucoma, bilateral, indeterminate stage: Secondary | ICD-10-CM | POA: Diagnosis not present
